# Patient Record
Sex: Male | Born: 1935 | Race: White | Hispanic: No | Marital: Married | Smoking: Former smoker
Health system: Southern US, Community
[De-identification: ages and names within clinical notes are randomized; demographics above are authoritative.]

## PROBLEM LIST (undated history)

## (undated) DIAGNOSIS — F419 Anxiety disorder, unspecified: Secondary | ICD-10-CM

## (undated) DIAGNOSIS — I10 Essential (primary) hypertension: Secondary | ICD-10-CM

## (undated) DIAGNOSIS — I872 Venous insufficiency (chronic) (peripheral): Secondary | ICD-10-CM

## (undated) DIAGNOSIS — F0280 Dementia in other diseases classified elsewhere without behavioral disturbance: Secondary | ICD-10-CM

## (undated) DIAGNOSIS — K219 Gastro-esophageal reflux disease without esophagitis: Secondary | ICD-10-CM

## (undated) DIAGNOSIS — G309 Alzheimer's disease, unspecified: Secondary | ICD-10-CM

## (undated) DIAGNOSIS — M129 Arthropathy, unspecified: Secondary | ICD-10-CM

## (undated) DIAGNOSIS — E538 Deficiency of other specified B group vitamins: Secondary | ICD-10-CM

## (undated) DIAGNOSIS — Z8719 Personal history of other diseases of the digestive system: Secondary | ICD-10-CM

## (undated) DIAGNOSIS — E876 Hypokalemia: Secondary | ICD-10-CM

## (undated) DIAGNOSIS — R55 Syncope and collapse: Secondary | ICD-10-CM

## (undated) HISTORY — DX: Anxiety disorder, unspecified: F41.9

## (undated) HISTORY — DX: Dementia in other diseases classified elsewhere, unspecified severity, without behavioral disturbance, psychotic disturbance, mood disturbance, and anxiety: F02.80

## (undated) HISTORY — DX: Alzheimer's disease, unspecified: G30.9

## (undated) HISTORY — PX: HERNIA REPAIR: SHX51

## (undated) HISTORY — DX: Essential (primary) hypertension: I10

## (undated) HISTORY — DX: Gastro-esophageal reflux disease without esophagitis: K21.9

## (undated) HISTORY — PX: BACK SURGERY: SHX140

---

## 1978-06-12 HISTORY — PX: APPENDECTOMY: SHX54

## 1997-09-14 ENCOUNTER — Ambulatory Visit (HOSPITAL_COMMUNITY): Admission: RE | Admit: 1997-09-14 | Discharge: 1997-09-14 | Payer: Self-pay | Admitting: Hematology & Oncology

## 1997-10-07 ENCOUNTER — Other Ambulatory Visit: Admission: RE | Admit: 1997-10-07 | Discharge: 1997-10-07 | Payer: Self-pay | Admitting: Hematology & Oncology

## 1998-07-22 ENCOUNTER — Ambulatory Visit (HOSPITAL_COMMUNITY): Admission: RE | Admit: 1998-07-22 | Discharge: 1998-07-22 | Payer: Self-pay | Admitting: Hematology & Oncology

## 1999-07-25 ENCOUNTER — Encounter: Admission: RE | Admit: 1999-07-25 | Discharge: 1999-07-25 | Payer: Self-pay | Admitting: Hematology & Oncology

## 1999-07-25 ENCOUNTER — Encounter: Payer: Self-pay | Admitting: Hematology & Oncology

## 1999-07-28 ENCOUNTER — Encounter: Admission: RE | Admit: 1999-07-28 | Discharge: 1999-07-28 | Payer: Self-pay | Admitting: Hematology & Oncology

## 1999-08-02 ENCOUNTER — Encounter: Payer: Self-pay | Admitting: Hematology & Oncology

## 1999-08-02 ENCOUNTER — Encounter: Admission: RE | Admit: 1999-08-02 | Discharge: 1999-08-02 | Payer: Self-pay | Admitting: Hematology & Oncology

## 2000-01-19 ENCOUNTER — Encounter: Payer: Self-pay | Admitting: Neurosurgery

## 2000-01-19 ENCOUNTER — Encounter: Admission: RE | Admit: 2000-01-19 | Discharge: 2000-01-19 | Payer: Self-pay | Admitting: Neurosurgery

## 2001-02-16 ENCOUNTER — Emergency Department (HOSPITAL_COMMUNITY): Admission: EM | Admit: 2001-02-16 | Discharge: 2001-02-16 | Payer: Self-pay | Admitting: Emergency Medicine

## 2001-02-16 ENCOUNTER — Encounter: Payer: Self-pay | Admitting: Emergency Medicine

## 2001-02-20 ENCOUNTER — Encounter: Payer: Self-pay | Admitting: Urology

## 2001-02-20 ENCOUNTER — Encounter: Admission: RE | Admit: 2001-02-20 | Discharge: 2001-02-20 | Payer: Self-pay | Admitting: Urology

## 2001-02-22 ENCOUNTER — Encounter: Payer: Self-pay | Admitting: Urology

## 2001-02-25 ENCOUNTER — Encounter: Payer: Self-pay | Admitting: Urology

## 2001-02-25 ENCOUNTER — Ambulatory Visit (HOSPITAL_COMMUNITY): Admission: RE | Admit: 2001-02-25 | Discharge: 2001-02-25 | Payer: Self-pay | Admitting: Urology

## 2001-03-06 ENCOUNTER — Ambulatory Visit (HOSPITAL_COMMUNITY): Admission: RE | Admit: 2001-03-06 | Discharge: 2001-03-06 | Payer: Self-pay | Admitting: Urology

## 2001-12-22 ENCOUNTER — Emergency Department (HOSPITAL_COMMUNITY): Admission: EM | Admit: 2001-12-22 | Discharge: 2001-12-22 | Payer: Self-pay | Admitting: Emergency Medicine

## 2002-06-12 HISTORY — PX: CATARACT EXTRACTION, BILATERAL: SHX1313

## 2002-06-12 HISTORY — PX: SHOULDER SURGERY: SHX246

## 2003-06-13 HISTORY — PX: EYE SURGERY: SHX253

## 2003-07-14 ENCOUNTER — Other Ambulatory Visit: Payer: Self-pay

## 2005-03-01 ENCOUNTER — Ambulatory Visit: Payer: Self-pay | Admitting: Internal Medicine

## 2005-03-01 DIAGNOSIS — K219 Gastro-esophageal reflux disease without esophagitis: Secondary | ICD-10-CM | POA: Insufficient documentation

## 2005-03-07 ENCOUNTER — Ambulatory Visit: Payer: Self-pay | Admitting: Internal Medicine

## 2005-04-13 ENCOUNTER — Ambulatory Visit: Payer: Self-pay | Admitting: Internal Medicine

## 2005-05-02 ENCOUNTER — Ambulatory Visit: Payer: Self-pay | Admitting: Internal Medicine

## 2005-10-11 ENCOUNTER — Ambulatory Visit: Payer: Self-pay | Admitting: Internal Medicine

## 2005-10-27 ENCOUNTER — Emergency Department: Payer: Self-pay | Admitting: Emergency Medicine

## 2006-03-21 ENCOUNTER — Ambulatory Visit: Payer: Self-pay | Admitting: Internal Medicine

## 2006-04-13 ENCOUNTER — Ambulatory Visit: Payer: Self-pay | Admitting: Internal Medicine

## 2006-09-07 ENCOUNTER — Emergency Department (HOSPITAL_COMMUNITY): Admission: EM | Admit: 2006-09-07 | Discharge: 2006-09-08 | Payer: Self-pay | Admitting: Emergency Medicine

## 2006-09-10 ENCOUNTER — Ambulatory Visit: Payer: Self-pay | Admitting: Internal Medicine

## 2006-09-11 ENCOUNTER — Ambulatory Visit: Payer: Self-pay | Admitting: Gastroenterology

## 2006-09-19 ENCOUNTER — Encounter: Payer: Self-pay | Admitting: Internal Medicine

## 2006-09-19 DIAGNOSIS — Z8719 Personal history of other diseases of the digestive system: Secondary | ICD-10-CM

## 2006-09-19 DIAGNOSIS — M159 Polyosteoarthritis, unspecified: Secondary | ICD-10-CM

## 2006-09-19 DIAGNOSIS — M129 Arthropathy, unspecified: Secondary | ICD-10-CM

## 2006-09-19 DIAGNOSIS — I872 Venous insufficiency (chronic) (peripheral): Secondary | ICD-10-CM

## 2006-09-19 DIAGNOSIS — I10 Essential (primary) hypertension: Secondary | ICD-10-CM

## 2006-09-19 DIAGNOSIS — K5900 Constipation, unspecified: Secondary | ICD-10-CM | POA: Insufficient documentation

## 2006-09-19 HISTORY — DX: Essential (primary) hypertension: I10

## 2006-09-19 HISTORY — DX: Personal history of other diseases of the digestive system: Z87.19

## 2006-09-19 HISTORY — DX: Arthropathy, unspecified: M12.9

## 2006-09-19 HISTORY — DX: Venous insufficiency (chronic) (peripheral): I87.2

## 2006-09-26 ENCOUNTER — Ambulatory Visit: Payer: Self-pay | Admitting: Gastroenterology

## 2006-10-12 ENCOUNTER — Ambulatory Visit: Payer: Self-pay | Admitting: Internal Medicine

## 2006-10-16 ENCOUNTER — Encounter: Payer: Self-pay | Admitting: Internal Medicine

## 2006-12-24 ENCOUNTER — Ambulatory Visit: Payer: Self-pay | Admitting: Internal Medicine

## 2007-03-27 ENCOUNTER — Telehealth (INDEPENDENT_AMBULATORY_CARE_PROVIDER_SITE_OTHER): Payer: Self-pay | Admitting: *Deleted

## 2007-03-29 ENCOUNTER — Ambulatory Visit: Payer: Self-pay | Admitting: Internal Medicine

## 2007-04-16 ENCOUNTER — Ambulatory Visit: Payer: Self-pay | Admitting: Internal Medicine

## 2007-04-17 LAB — CONVERTED CEMR LAB
CO2: 30 meq/L (ref 19–32)
Chloride: 106 meq/L (ref 96–112)
GFR calc Af Amer: 105 mL/min
Glucose, Bld: 94 mg/dL (ref 70–99)
Potassium: 4 meq/L (ref 3.5–5.1)
Sodium: 143 meq/L (ref 135–145)

## 2007-05-13 ENCOUNTER — Telehealth: Payer: Self-pay | Admitting: Internal Medicine

## 2007-06-26 ENCOUNTER — Telehealth (INDEPENDENT_AMBULATORY_CARE_PROVIDER_SITE_OTHER): Payer: Self-pay | Admitting: *Deleted

## 2007-07-15 ENCOUNTER — Ambulatory Visit: Payer: Self-pay | Admitting: Internal Medicine

## 2007-07-22 ENCOUNTER — Ambulatory Visit: Payer: Self-pay | Admitting: Gastroenterology

## 2007-07-26 ENCOUNTER — Encounter: Payer: Self-pay | Admitting: Internal Medicine

## 2007-07-31 ENCOUNTER — Telehealth (INDEPENDENT_AMBULATORY_CARE_PROVIDER_SITE_OTHER): Payer: Self-pay | Admitting: *Deleted

## 2007-08-27 ENCOUNTER — Ambulatory Visit (HOSPITAL_COMMUNITY): Admission: RE | Admit: 2007-08-27 | Discharge: 2007-08-27 | Payer: Self-pay | Admitting: Gastroenterology

## 2007-09-13 ENCOUNTER — Telehealth (INDEPENDENT_AMBULATORY_CARE_PROVIDER_SITE_OTHER): Payer: Self-pay | Admitting: *Deleted

## 2007-10-04 ENCOUNTER — Ambulatory Visit: Payer: Self-pay | Admitting: Internal Medicine

## 2007-10-07 LAB — CONVERTED CEMR LAB
ALT: 13 units/L (ref 0–53)
AST: 18 units/L (ref 0–37)
Basophils Absolute: 0 10*3/uL (ref 0.0–0.1)
Basophils Relative: 1 % (ref 0–1)
CO2: 22 meq/L (ref 19–32)
Chloride: 108 meq/L (ref 96–112)
Creatinine, Ser: 0.98 mg/dL (ref 0.40–1.50)
Hemoglobin: 15 g/dL (ref 13.0–17.0)
Indirect Bilirubin: 0.5 mg/dL (ref 0.0–0.9)
Lymphocytes Relative: 21 % (ref 12–46)
Monocytes Absolute: 0.5 10*3/uL (ref 0.1–1.0)
Neutro Abs: 4.3 10*3/uL (ref 1.7–7.7)
Neutrophils Relative %: 69 % (ref 43–77)
Phosphorus: 2.4 mg/dL (ref 2.3–4.6)
Potassium: 4.1 meq/L (ref 3.5–5.3)
RDW: 13.8 % (ref 11.5–15.5)
Sodium: 143 meq/L (ref 135–145)
TSH: 0.858 microintl units/mL (ref 0.350–5.50)
Total Protein: 7.2 g/dL (ref 6.0–8.3)
Vitamin B-12: 374 pg/mL (ref 211–911)

## 2007-10-08 ENCOUNTER — Ambulatory Visit: Payer: Self-pay | Admitting: Cardiology

## 2007-10-08 ENCOUNTER — Telehealth: Payer: Self-pay | Admitting: Internal Medicine

## 2007-10-21 ENCOUNTER — Telehealth (INDEPENDENT_AMBULATORY_CARE_PROVIDER_SITE_OTHER): Payer: Self-pay | Admitting: *Deleted

## 2007-11-12 ENCOUNTER — Telehealth: Payer: Self-pay | Admitting: Internal Medicine

## 2007-11-14 ENCOUNTER — Ambulatory Visit: Payer: Self-pay | Admitting: Internal Medicine

## 2007-11-18 ENCOUNTER — Telehealth (INDEPENDENT_AMBULATORY_CARE_PROVIDER_SITE_OTHER): Payer: Self-pay | Admitting: *Deleted

## 2007-11-21 ENCOUNTER — Encounter: Payer: Self-pay | Admitting: Internal Medicine

## 2007-11-22 ENCOUNTER — Inpatient Hospital Stay (HOSPITAL_COMMUNITY): Admission: RE | Admit: 2007-11-22 | Discharge: 2007-11-24 | Payer: Self-pay | Admitting: Surgery

## 2007-11-24 ENCOUNTER — Encounter: Payer: Self-pay | Admitting: Internal Medicine

## 2008-01-27 ENCOUNTER — Telehealth: Payer: Self-pay | Admitting: Family Medicine

## 2008-03-09 ENCOUNTER — Telehealth: Payer: Self-pay | Admitting: Internal Medicine

## 2008-03-11 ENCOUNTER — Ambulatory Visit: Payer: Self-pay | Admitting: Internal Medicine

## 2008-05-06 ENCOUNTER — Telehealth: Payer: Self-pay | Admitting: Family Medicine

## 2008-06-09 ENCOUNTER — Telehealth: Payer: Self-pay | Admitting: Internal Medicine

## 2008-09-25 ENCOUNTER — Telehealth: Payer: Self-pay | Admitting: Internal Medicine

## 2008-09-25 ENCOUNTER — Ambulatory Visit: Payer: Self-pay | Admitting: Internal Medicine

## 2008-09-25 DIAGNOSIS — F0280 Dementia in other diseases classified elsewhere without behavioral disturbance: Secondary | ICD-10-CM | POA: Insufficient documentation

## 2008-09-25 DIAGNOSIS — G309 Alzheimer's disease, unspecified: Secondary | ICD-10-CM

## 2008-09-25 HISTORY — DX: Dementia in other diseases classified elsewhere, unspecified severity, without behavioral disturbance, psychotic disturbance, mood disturbance, and anxiety: F02.80

## 2008-11-04 ENCOUNTER — Ambulatory Visit: Payer: Self-pay | Admitting: Internal Medicine

## 2008-11-06 LAB — CONVERTED CEMR LAB
ALT: 17 units/L (ref 0–53)
AST: 26 units/L (ref 0–37)
Albumin: 4.7 g/dL (ref 3.5–5.2)
BUN: 10 mg/dL (ref 6–23)
Basophils Absolute: 0 10*3/uL (ref 0.0–0.1)
Bilirubin, Direct: 0.1 mg/dL (ref 0.0–0.3)
Creatinine, Ser: 0.8 mg/dL (ref 0.4–1.5)
Eosinophils Relative: 2.5 % (ref 0.0–5.0)
HCT: 43.8 % (ref 39.0–52.0)
Lymphs Abs: 1.1 10*3/uL (ref 0.7–4.0)
Monocytes Absolute: 0.3 10*3/uL (ref 0.1–1.0)
Monocytes Relative: 7.9 % (ref 3.0–12.0)
Neutrophils Relative %: 63.7 % (ref 43.0–77.0)
Phosphorus: 3.2 mg/dL (ref 2.3–4.6)
Platelets: 203 10*3/uL (ref 150.0–400.0)
RDW: 11.8 % (ref 11.5–14.6)
Sed Rate: 5 mm/hr (ref 0–22)
Total Bilirubin: 1 mg/dL (ref 0.3–1.2)
Vitamin B-12: 193 pg/mL — ABNORMAL LOW (ref 211–911)
WBC: 4.4 10*3/uL — ABNORMAL LOW (ref 4.5–10.5)

## 2008-11-23 ENCOUNTER — Ambulatory Visit: Payer: Self-pay | Admitting: Internal Medicine

## 2008-11-23 DIAGNOSIS — E538 Deficiency of other specified B group vitamins: Secondary | ICD-10-CM

## 2008-11-23 HISTORY — DX: Deficiency of other specified B group vitamins: E53.8

## 2008-11-23 LAB — CONVERTED CEMR LAB: Vitamin B-12: 407 pg/mL (ref 211–911)

## 2009-03-16 ENCOUNTER — Ambulatory Visit: Payer: Self-pay | Admitting: Internal Medicine

## 2009-11-22 ENCOUNTER — Telehealth: Payer: Self-pay | Admitting: Internal Medicine

## 2009-12-09 ENCOUNTER — Ambulatory Visit: Payer: Self-pay | Admitting: Internal Medicine

## 2009-12-09 DIAGNOSIS — F39 Unspecified mood [affective] disorder: Secondary | ICD-10-CM | POA: Insufficient documentation

## 2010-01-28 ENCOUNTER — Ambulatory Visit: Payer: Self-pay | Admitting: Internal Medicine

## 2010-03-09 ENCOUNTER — Ambulatory Visit: Payer: Self-pay | Admitting: Internal Medicine

## 2010-04-10 IMAGING — CT CT ABDOMEN WO/W CM
3 of 6 series · 12 of 46 positions shown, 17 images · IV contrast (agent unspecified)
Comparison: No comparison images.  Reviewed a report from
02/16/2001.

Addendum Begins

Findings of this report were discussed with Dr. Sherling Anneth.
Addendum Ends
CLINICAL DATA: Left flank pain
CT ABDOMEN WITHOUT AND WITH CONTRAST
TECHNIQUE: Multidetector CT imaging of the abdomen was performed
following the standard protocol before and during bolus
administration of intravenous contrast.
Contrast: 125 ml.

[Series 3: abd xxl 5.0 b10f st · axial · 0.91mm/px · z∈[-234,+22]mm · 8 of 67 slices shown, 13 images]
[im 8/67  soft-tissue]
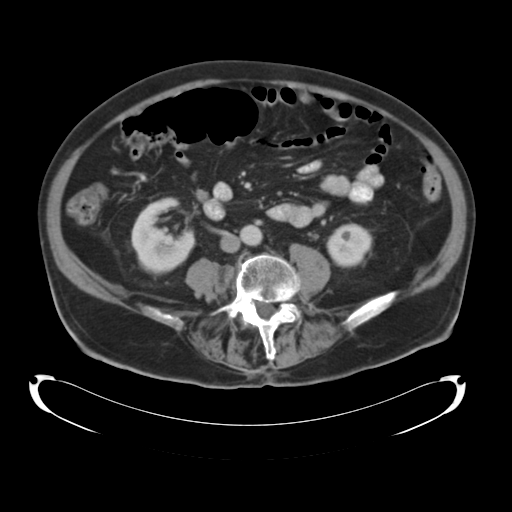
[im 8/67  bone]
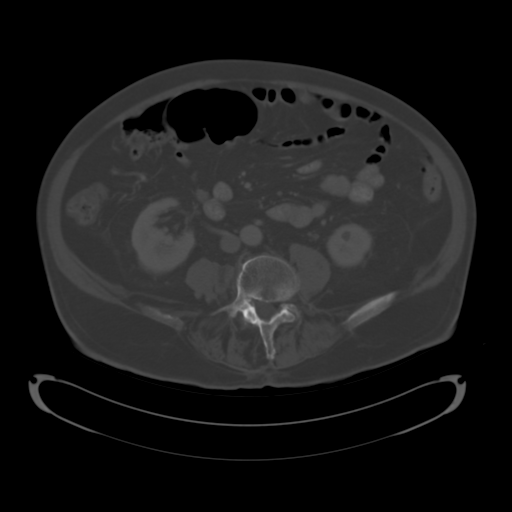
[im 15/67  soft-tissue]
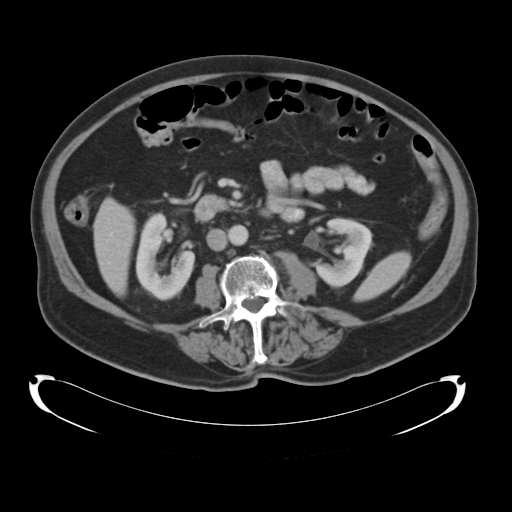
[im 23/67  soft-tissue]
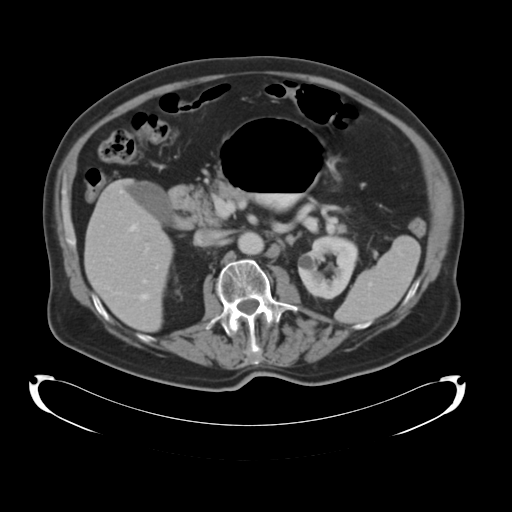
[im 30/67  soft-tissue]
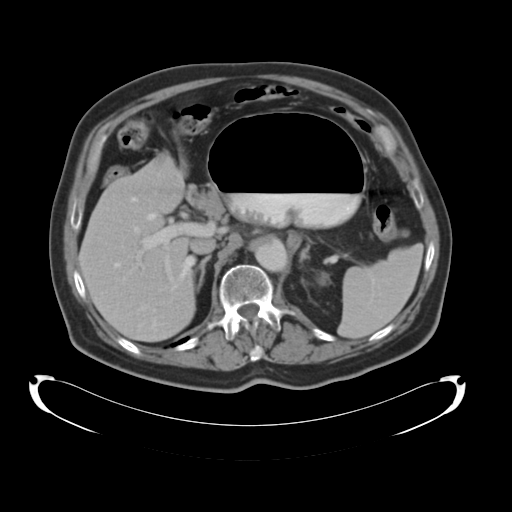
[im 37/67  soft-tissue]
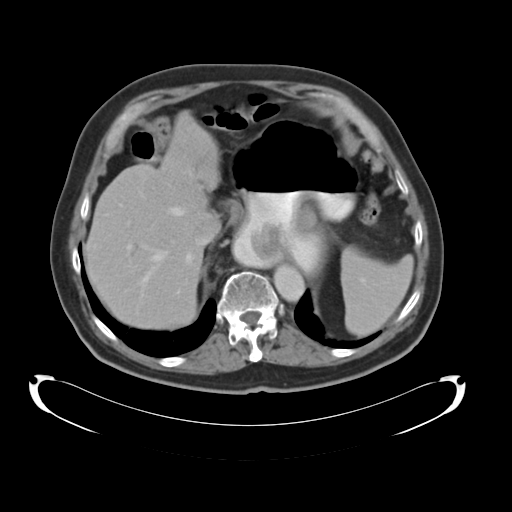
[im 37/67  lung]
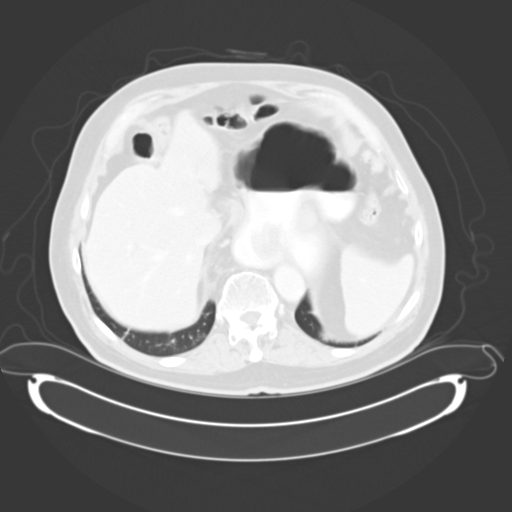
[im 45/67  soft-tissue]
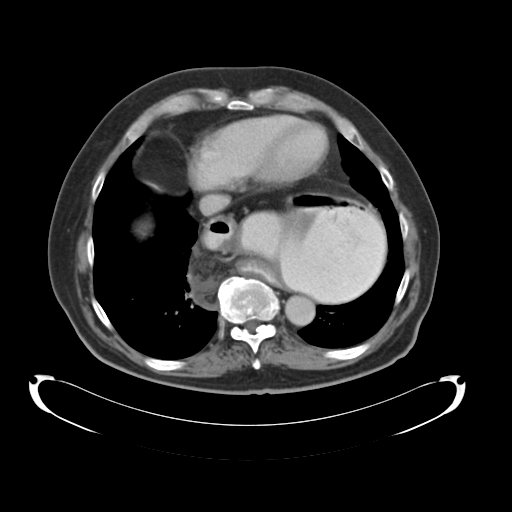
[im 45/67  lung]
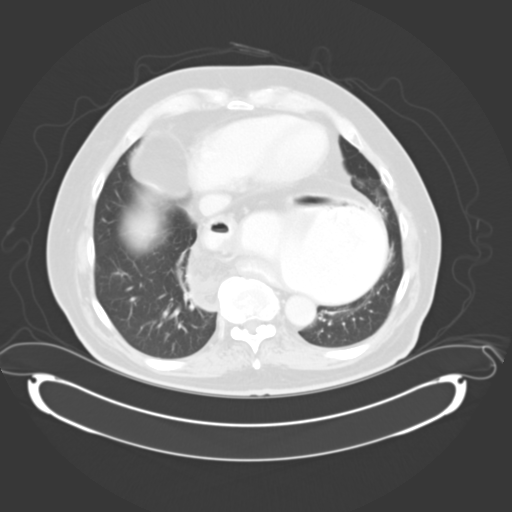
[im 52/67  soft-tissue]
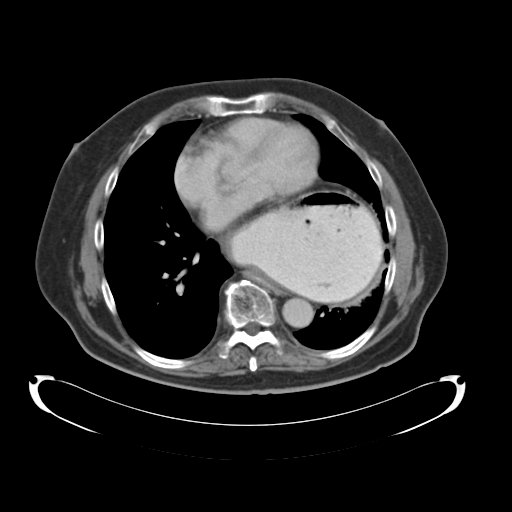
[im 52/67  lung]
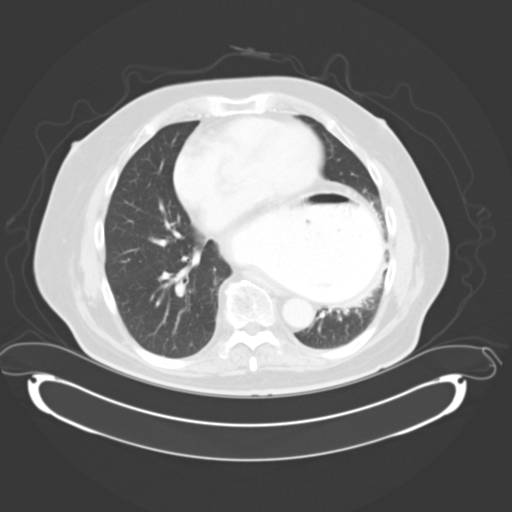
[im 59/67  soft-tissue]
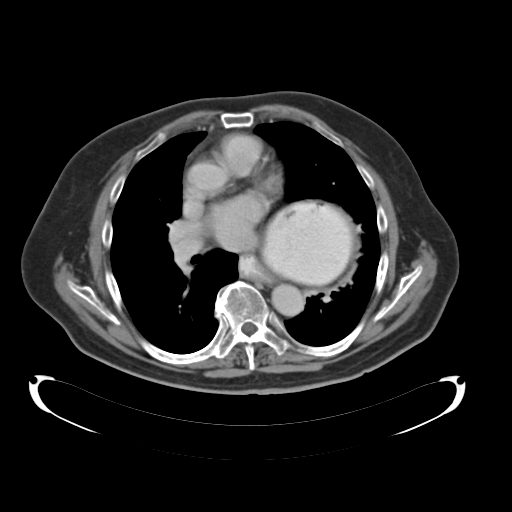
[im 59/67  lung]
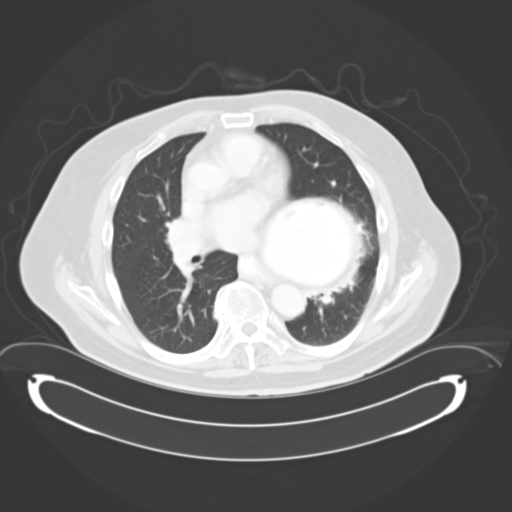

[Series 602: <mpr thick range> · coronal · 0.91mm/px · 3 of 98 slices shown]
[im 33/98  soft-tissue]
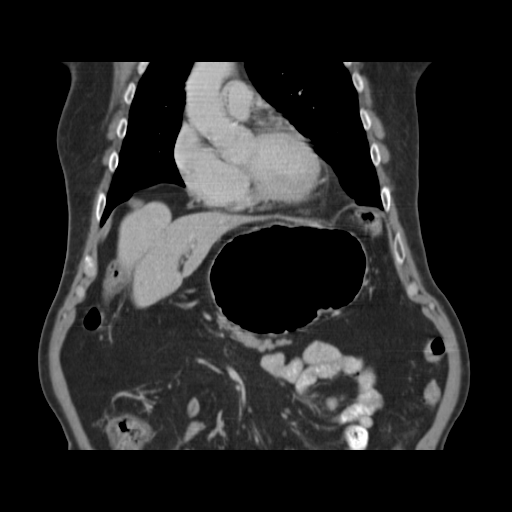
[im 44/98  soft-tissue]
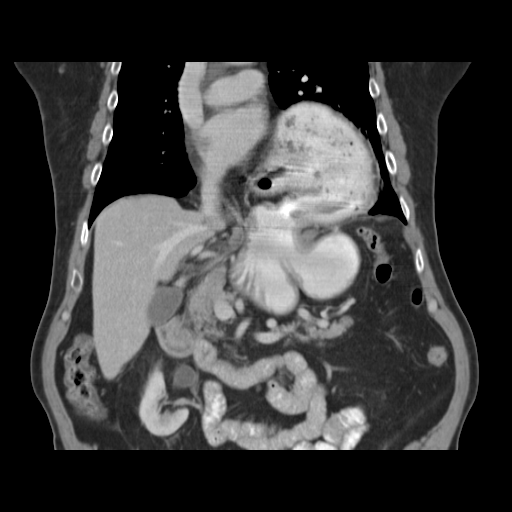
[im 54/98  soft-tissue]
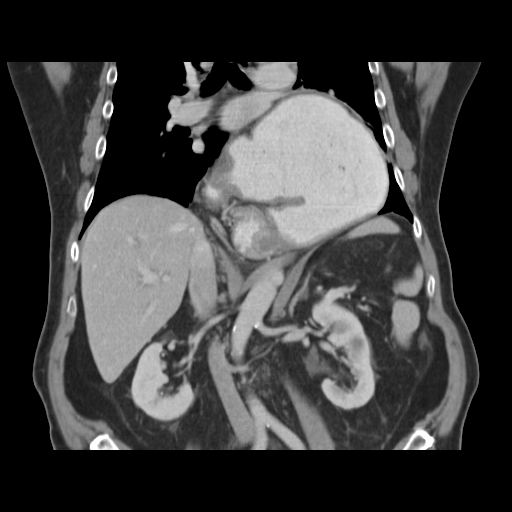

[Series 603: <mpr thick range(1)> · sagittal · 0.91mm/px · 1 of 133 slices shown]
[im 45/133  soft-tissue]
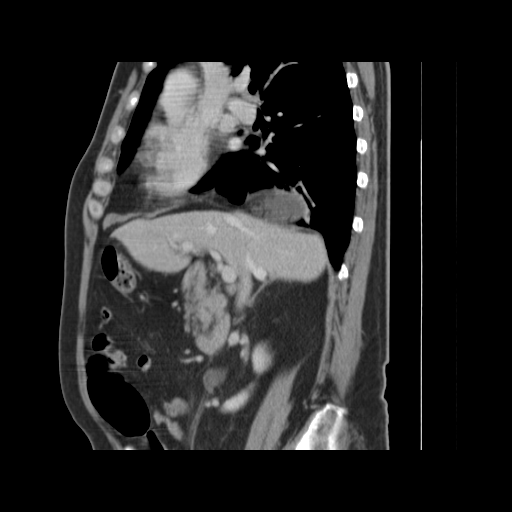

[12 of 46 positions shown; findings below may reference images not displayed]

FINDINGS: The lung bases are clear and there is no evidence for
free air.  The patient has a very large para-esophageal hiatal
hernia.  There is a large amount of oral contrast within the
stomach and small amount of contrast in the small bowel.  Dependent
fluid or edema in the herniating tissue in the right hemithorax.
The greater curvature of the stomach has a cephalad position and
findings are suggestive for an organoaxial gastric volvulus.  There
is a large air-fluid level in the intra-abdominal portion of the
stomach and the gastric antrum and pylorus appear to be near the
esophageal hiatus.  Suspect that there is a component of partial
gastric outlet obstruction.  3 mm stone in the mid pole of the
right kidney.  There are no renal calculi identified in the left
kidney.  Small cortical cyst along the lower pole of the right
kidney and evidence for three cortical cysts in the mid pole of the
left kidney and a smaller cyst in the lower pole of the left
kidney.  Evidence for extrarenal pelvis bilaterally without
hydronephrosis.  Normal appearance of the adrenal tissue, spleen
and pancreas and liver.  The portal venous system is patent.  Few
scattered punctate low density areas throughout the liver probably
represent small cysts.  The gallbladder has a normal appearance.
There are diverticula associated the left colon without acute
inflammatory changes.  Mild perinephric stranding bilaterally and
suspect this is chronic. Multilevel degenerative changes of the
lumbar spine.
IMPRESSION: Large para-esophageal hiatal hernia with distension of the stomach.
The orientation of the stomach is suggestive for an organoaxial
gastric volvulus.  Suspect there is a partial gastric outlet
obstruction.  The presence of fluid and edema in the herniating fat
is concerning for a component of vascular compromise and
strangulation.  There is no significant gastric wall thickening at
this time.

Nonobstructive right renal calculus.

Bilateral renal cysts.

Critical test results telephoned to Dr. Bon Ryan at the time of
interpretation on date 10/08/2007 at time [DATE]. These results
were repeated back to the interpreting radiologist for
verification. Dr.Bon Ryan asked us to inform Dr. Thanh Tam Kientz of
Surgery of this report.  Dr. Jose J was unavailable and we will make
an attempt to contact the covering physician..

## 2010-04-25 ENCOUNTER — Ambulatory Visit: Payer: Self-pay | Admitting: Internal Medicine

## 2010-04-28 LAB — CONVERTED CEMR LAB
ALT: 19 U/L
AST: 26 U/L
Albumin: 4.7 g/dL
Alkaline Phosphatase: 48 U/L
BUN: 14 mg/dL
Basophils Absolute: 0 K/uL
Basophils Relative: 0.4 %
Bilirubin, Direct: 0.2 mg/dL
CO2: 28 meq/L
Calcium: 9.8 mg/dL
Chloride: 103 meq/L
Creatinine, Ser: 0.8 mg/dL
Eosinophils Absolute: 0.1 K/uL
Eosinophils Relative: 2.9 %
GFR calc non Af Amer: 101.4 mL/min
Glucose, Bld: 94 mg/dL
HCT: 42 %
Hemoglobin: 14.7 g/dL
Lymphocytes Relative: 22 %
Lymphs Abs: 1.1 K/uL
MCHC: 35 g/dL
MCV: 104.2 fL — ABNORMAL HIGH
Monocytes Absolute: 0.5 K/uL
Monocytes Relative: 9.1 %
Neutro Abs: 3.3 K/uL
Neutrophils Relative %: 65.6 %
Phosphorus: 3.3 mg/dL
Platelets: 226 K/uL
Potassium: 4.6 meq/L
RBC: 4.03 M/uL — ABNORMAL LOW
RDW: 13.5 %
Sodium: 138 meq/L
TSH: 1.59 u[IU]/mL
Total Bilirubin: 0.8 mg/dL
Total Protein: 6.9 g/dL
WBC: 5 10*3/microliter

## 2010-05-31 ENCOUNTER — Telehealth: Payer: Self-pay | Admitting: Internal Medicine

## 2010-07-03 ENCOUNTER — Encounter: Payer: Self-pay | Admitting: Surgery

## 2010-07-12 NOTE — Assessment & Plan Note (Signed)
Summary: follow-up/ds   Vital Signs:  Patient profile:   77 year old male Height:      68 inches Weight:      198.25 pounds BMI:     30.25 Temp:     98.1 degrees F oral Pulse rate:   68 / minute Pulse rhythm:   regular BP sitting:   128 / 76  (left arm) Cuff size:   regular  Vitals Entered By: Lewanda Rife LPN (December 09, 2009 2:38 PM) CC: follow-up visit   History of Present Illness: He feels he is doing well He states "I work every day"--wife states he doesn't work Mostly in chair in house He feels he is satisfied  She notes slow decline in memory Still independent in all personal care Never did indoor work wife handles all the finances  Gets upset per wife----son still running business and he wants to go with him No  insight wife feels he has gotten worse---upset, anxious  Wife still able to leave him to do errands, etc  Allergies (verified): No Known Drug Allergies  Past History:  Past medical, surgical, family and social histories (including risk factors) reviewed for relevance to current acute and chronic problems.  Past Medical History: GERD  Hypertension Altzheimers Anxiety  Past Surgical History: Reviewed history from 09/19/2006 and no changes required. Appendectomy 1980 Back surgery 1982 Back surgery 1992 Bilateral cataract removal 2003 Right shoulder surgery 2004 Laser surgery eyes 2005  Family History: Reviewed history from 11/14/2007 and no changes required. Dad died @62  of kidney disease Mom died @92  asthma & old age.Had Altzheimers 2 brothers---1 died @68  of COPD. Othe with Altzheimers 2 sisters No CAD, DM Arthritis in older brother No cancer  Social History: Reviewed history from 07/15/2007 and no changes required. Occupation: does grading work--getting ready to slow down Married 2 children Never Smoked Alcohol use-no "Workaholic"  No living will--would accept resuscitation (discussion 11/06)  Review of Systems       sleeps  fine appetite is fine weight is fairly stable  Physical Exam  General:  alert and normal appearance.   Neurologic:  repeating himself no insight clearly has impaired judgement Psych:  normally interactive, good eye contact, and slightly anxious.     Impression & Recommendations:  Problem # 1:  ALZHEIMERS DISEASE (ICD-331.0) Assessment Deteriorated has had continued decline cognitively aricept did no good  Problem # 2:  HYPERTENSION (ICD-401.9) Assessment: Unchanged BP is fine His updated medication list for this problem includes:    Plendil 5 Mg Tb24 (Felodipine) .Marland Kitchen... 1 daily  BP today: 128/76 Prior BP: 138/62 (11/04/2008)  Prior 10 Yr Risk Heart Disease: Not enough information (10/12/2006)  Labs Reviewed: K+: 4.6 (11/04/2008) Creat: : 0.8 (11/04/2008)     Problem # 3:  ANXIETY (ICD-300.00) Assessment: Deteriorated  has had more affective problems mostly anxiety related to not working, etc----not clearly depressed  His updated medication list for this problem includes:    Citalopram Hydrobromide 10 Mg Tabs (Citalopram hydrobromide) .Marland Kitchen... 1 tab daily for anxiety  Complete Medication List: 1)  Plendil 5 Mg Tb24 (Felodipine) .Marland Kitchen.. 1 daily 2)  Centrum Tabs (Multiple vitamins-minerals) .Marland Kitchen.. 1 once daily 3)  Citalopram Hydrobromide 10 Mg Tabs (Citalopram hydrobromide) .Marland Kitchen.. 1 tab daily for anxiety  Patient Instructions: 1)  Please schedule a follow-up appointment in 6 weeks.  2)  Please start the citalopram once daily. Call if any apparent side effects like sedation or increased agitation Prescriptions: CITALOPRAM HYDROBROMIDE 10 MG TABS (CITALOPRAM HYDROBROMIDE) 1  tab daily for anxiety  #30 x 11   Entered and Authorized by:   Cindee Salt MD   Signed by:   Cindee Salt MD on 12/09/2009   Method used:   Electronically to        [facility]. [address] [phone]* (retail)       [address] [place], [place]  [address]       Ph: [phone]        Fax: [phone]   RxID:   [id number]   Current Allergies (reviewed today): No known allergies

## 2010-07-12 NOTE — Assessment & Plan Note (Signed)
Summary: 6 WK F/U/CLE   Vital Signs:  Patient profile:   77 year old male Weight:      197 pounds Temp:     98.1 degrees F oral Pulse rate:   60 / minute Pulse rhythm:   regular BP sitting:   126 / 70  (left arm) Cuff size:   large  Vitals Entered By: Mervin Hack CMA Duncan Dull) (January 28, 2010 3:05 PM) CC: 6 week follow-up   History of Present Illness: He feels fine No complaints some leg pain--late afternoon (though he has legs up in recliner most of the day) still speaks about working on tractor, etc (which he doesn't do anymore)  Wife notes that his mood doesn't seem better on the citalopram No side effects not depressed  Allergies: No Known Drug Allergies  Past History:  Past medical, surgical, family and social histories (including risk factors) reviewed for relevance to current acute and chronic problems.  Past Medical History: Reviewed history from 12/09/2009 and no changes required. GERD  Hypertension Altzheimers Anxiety  Past Surgical History: Reviewed history from 09/19/2006 and no changes required. Appendectomy 1980 Back surgery 1982 Back surgery 1992 Bilateral cataract removal 2003 Right shoulder surgery 2004 Laser surgery eyes 2005  Family History: Reviewed history from 11/14/2007 and no changes required. Dad died @62  of kidney disease Mom died @92  asthma & old age.Had Altzheimers 2 brothers---1 died @68  of COPD. Othe with Altzheimers 2 sisters No CAD, DM Arthritis in older brother No cancer  Social History: Reviewed history from 07/15/2007 and no changes required. 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   Psych:  normally interactive, good eye contact, not  anxious appearing, and not depressed appearing.     Impression & Recommendations:  Problem # 1:  ANXIETY (ICD-300.00) Assessment Unchanged will increase the dose to 20mg   consider increase to 40mg  if he has a partial respose without side effects  The following medications were removed from the medication list:    Citalopram Hydrobromide 10 Mg Tabs (Citalopram hydrobromide) .Marland Kitchen... 1 tab daily for anxiety His updated medication list for this problem includes:    Citalopram Hydrobromide 20 Mg Tabs (Citalopram hydrobromide) .Marland Kitchen... 1 tab by mouth daily for anxiety  Complete Medication List: 1)  Plendil 5 Mg Tb24 (Felodipine) .Marland Kitchen.. 1 daily 2)  Centrum Tabs (Multiple vitamins-minerals) .Marland Kitchen.. 1 once daily 3)  Citalopram Hydrobromide 20 Mg Tabs (Citalopram hydrobromide) .Marland Kitchen.. 1 tab by mouth daily for anxiety  Patient Instructions: 1)  Please schedule a follow-up appointment in 3-4 months .  2)  Please increase the citalopram to 20mg  daily 3)  Please call if the anxiety is not better on the new dose within about 1 month. Prescriptions: CITALOPRAM HYDROBROMIDE 20 MG TABS (CITALOPRAM HYDROBROMIDE) 1 tab by mouth daily for anxiety  #30 x 5   Entered and Authorized by:   Cindee Salt MD   Signed by:   Cindee Salt MD on 01/28/2010   Method used:   Electronically to        [facility]. [address] [phone]* (retail)       [address] [place], [place]  [address]       Ph: [phone]  Fax: [phone]   RxID:   [id number]   Current Allergies (reviewed today): No known allergies

## 2010-07-12 NOTE — Progress Notes (Signed)
Summary: PLENDIL  Phone Note Refill Request Message from:  [id number] on November 22, 2009 11:02 AM  Refills Requested: Medication #1:  PLENDIL 5 MG TB24 1 daily   Last Refilled: 11/11/2009 E-Scribe Request, patient hasn't been send since 11/04/2008, pt sch appt for 12/09/2009 @ 2:45, ok to fill?   Method Requested: Electronic Initial call taken by: Mervin Hack CMA Duncan Dull),  November 22, 2009 11:07 AM  Follow-up for Phone Call        okay to fill #30 x 1 Follow-up by: Cindee Salt MD,  November 22, 2009 2:07 PM  Additional Follow-up for Phone Call Additional follow up Details #1::        Rx faxed to pharmacy Additional Follow-up by: DeShannon Katrinka Blazing CMA Duncan Dull),  November 22, 2009 2:39 PM    Prescriptions: PLENDIL 5 MG TB24 (FELODIPINE) 1 daily  #30 Tablet x 1   Entered by:   Mervin Hack CMA (AAMA)   Authorized by:   Cindee Salt MD   Signed by:   Mervin Hack CMA (AAMA) on 11/22/2009   Method used:   Electronically to        [facility]. [address] [phone]* (retail)       [address] [place], [place]  [id number]       Ph: [phone]       Fax: [phone]   RxID:   [id number]

## 2010-07-12 NOTE — Assessment & Plan Note (Signed)
Summary: ROA FOR 3-4 MONTH FOLLOW-UP/JRR   Vital Signs:  Patient profile:   77 year old male Weight:      196 pounds Temp:     98.6 degrees F oral Pulse rate:   68 / minute Pulse rhythm:   regular BP sitting:   140 / 60  (left arm) Cuff size:   large  Vitals Entered By: Mervin Hack CMA Duncan Dull) (April 25, 2010 3:30 PM) CC: follow-up   History of Present Illness: DOIng okay wife is here he doesn't notice any changes  Wife does feel his nerves are better on the increased citalopram No depression  Memory issues continue wife notes some progression Wife has always done the bills has stopped all work but he still talks about doing this Still handles all personal care though he is not as good at shaving  No chest pain  No SOB  No edema but he does get pain in calves and feet takes tylenol--that helps   Allergies: No Known Drug Allergies  Past History:  Past medical, surgical, family and social histories (including risk factors) reviewed for relevance to current acute and chronic problems.  Past Medical History: Reviewed history from 12/09/2009 and no changes required. GERD  Hypertension Altzheimers Anxiety  Past Surgical History: Reviewed history from 09/19/2006 and no changes required. Appendectomy 1980 Back surgery 1982 Back surgery 1992 Bilateral cataract removal 2003 Right shoulder surgery 2004 Laser surgery eyes 2005  Family History: Reviewed history from 11/14/2007 and no changes required. Dad died @62  of kidney disease Mom died @92  asthma & old age.Had Altzheimers 2 brothers---1 died @68  of COPD. Othe with Altzheimers 2 sisters No CAD, DM Arthritis in older brother No cancer  Social History: Reviewed history from 07/15/2007 and no changes required. 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   Neck:  supple, no masses, no thyromegaly, no carotid bruits, and no cervical lymphadenopathy.   Lungs:  normal respiratory effort, no intercostal retractions, no accessory muscle use, and normal breath sounds.   Heart:  normal rate, regular rhythm, no murmur, and no gallop.   Pulses:  1+ in feet Extremities:  no sig edema or calf tenderness Neurologic:  confused but pleasant still talks about his daily grading work when he hasn't done this in a long time Psych:  normally interactive, good eye contact, not anxious appearing, and not depressed appearing.     Impression & Recommendations:  Problem # 1:  ANXIETY (ICD-300.00) Assessment Improved doing better on this dose no changes  His updated medication list for this problem includes:    Citalopram Hydrobromide 20 Mg Tabs (Citalopram hydrobromide) .Marland Kitchen... 1 tab by mouth daily for anxiety  Problem # 2:  ALZHEIMERS DISEASE (ICD-331.0) Assessment: Deteriorated mild with slow progression early discussion with wife about finances and needing help eventually  Problem # 3:  HYPERTENSION (ICD-401.9) Assessment: Unchanged  good control No changes needed due for labs  His updated medication list for this problem includes:    Plendil 5 Mg Tb24 (Felodipine) .Marland Kitchen... 1 daily  BP today: 140/60 Prior BP: 126/70 (01/28/2010)  Prior 10 Yr Risk Heart Disease: Not enough information (10/12/2006)  Labs Reviewed: K+: 4.6 (11/04/2008) Creat: : 0.8 (11/04/2008)  Orders: Venipuncture (59563) TLB-Renal Function Panel (80069-RENAL) TLB-CBC Platelet - w/Differential (85025-CBCD) TLB-Hepatic/Liver Function Pnl (80076-HEPATIC) TLB-TSH (Thyroid Stimulating Hormone) (84443-TSH)  Complete Medication List: 1)  Plendil 5 Mg Tb24 (Felodipine) .Marland Kitchen.. 1 daily 2)  Citalopram Hydrobromide 20 Mg Tabs (Citalopram  hydrobromide) .Marland Kitchen.. 1 tab by mouth daily for anxiety 3)  Centrum Tabs (Multiple vitamins-minerals) .Marland Kitchen.. 1 once daily  Patient Instructions: 1)  Please schedule a follow-up appointment in 4 months .    Orders Added: 1)  Est. Patient Level IV [87564] 2)  Venipuncture [36415] 3)  TLB-Renal Function Panel [80069-RENAL] 4)  TLB-CBC Platelet - w/Differential [85025-CBCD] 5)  TLB-Hepatic/Liver Function Pnl [80076-HEPATIC] 6)  TLB-TSH (Thyroid Stimulating Hormone) [33295-JOA]    Current Allergies (reviewed today): No known allergies

## 2010-07-12 NOTE — Assessment & Plan Note (Signed)
Summary: letvak flu shot/rbh   Nurse Visit   Allergies: No Known Drug Allergies  Orders Added: 1)  Flu Vaccine 72yrs + MEDICARE PATIENTS [Q2039] 2)  Administration Flu vaccine - MCR [G0008]    Flu Vaccine Consent Questions     Do you have a history of severe allergic reactions to this vaccine? no    Any prior history of allergic reactions to egg and/or gelatin? no    Do you have a sensitivity to the preservative Thimersol? no    Do you have a past history of Guillan-Barre Syndrome? no    Do you currently have an acute febrile illness? no    Have you ever had a severe reaction to latex? no    Vaccine information given and explained to patient? yes    Are you currently pregnant? no    Lot Number:[id number]   Exp Date:12/10/2010   Manufacturer: [employer]    Site Given  Left Deltoid IM

## 2010-07-14 NOTE — Progress Notes (Signed)
Summary: ? Problem with medication  Phone Note Call from Patient Call back at Home Phone [phone]   Caller: Spouse Call For: [name] Salt MD Summary of Call: Patient's wife came in for her B-12 shot today and requested that a message be sent to you regarding her husband. [name] is concerned that the anti-depressant medication that patient  is on is causing him to be hyper. Patient's wife states that he follows her thru the house and ask her the same questions all day long. Wife states that he is not sleeping at night and is up and down all night long.  [name] states that patient gets angry at the least little thing. [name] states that he has gotten turned around while driving and has ended up in the wrong place.  The reason she hasn't called before today is because he stays under her feet all the time and she felt that she needed to explain this to you without him being present because he get upset with her easily.   [employer], [place]  Initial call taken by: Sydell Axon LPN,  May 31, 2010 9:03 AM  Follow-up for Phone Call        I am not sure whether the medication is causing this or the Altzheimers The medication is supposed to help this type of behavior but occ can make it worse Please have her try just 1/2 of the citalopram for now set up appt in 1 week or so to review----we may need to try a different type of medication for him Follow-up by: [name] Salt MD,  May 31, 2010 9:43 AM  Additional Follow-up for Phone Call Additional follow up Details #1::        Spoke with patient's wife and advised results. Wife will call after the holidays for an appt. Additional Follow-up by: Mervin Hack CMA Duncan Dull),  May 31, 2010 12:37 PM

## 2010-07-16 ENCOUNTER — Encounter: Payer: Self-pay | Admitting: Internal Medicine

## 2010-07-25 ENCOUNTER — Telehealth: Payer: Self-pay | Admitting: Internal Medicine

## 2010-08-02 ENCOUNTER — Ambulatory Visit: Payer: Medicare Other

## 2010-08-02 ENCOUNTER — Encounter: Payer: Self-pay | Admitting: Internal Medicine

## 2010-08-02 DIAGNOSIS — Z111 Encounter for screening for respiratory tuberculosis: Secondary | ICD-10-CM

## 2010-08-03 NOTE — Progress Notes (Signed)
Summary: needs to increase citalopram dose  Phone Note Call from Patient Call back at Home Phone [phone]   Caller: Spouse  Peggy Summary of Call: Pt's wife states he is very agitated with knowing that he will be going to memory care.  She is asking if his citalopram dose can be increased until he gets admitted and settled down.  [facility]ses [facility] [address] Initial call taken by: Lowella Petties CMA, AAMA,  July 25, 2010 2:28 PM  Follow-up for Phone Call        that will not help in immediate sense  I would try alprazolam 0.25 1/2-1 tab three times a day as needed for agitation #30 x 0 This should calm him better when he gets agitated Follow-up by: Cindee Salt MD,  July 25, 2010 2:37 PM  Additional Follow-up for Phone Call Additional follow up Details #1::        Rx Called In, Spoke with patient's wife and advised results.  Additional Follow-up by: Mervin Hack CMA Duncan Dull),  July 25, 2010 4:41 PM    New/Updated Medications: ALPRAZOLAM 0.25 MG TABS (ALPRAZOLAM) 1/2-1 tab three times a day as needed for agitation Prescriptions: ALPRAZOLAM 0.25 MG TABS (ALPRAZOLAM) 1/2-1 tab three times a day as needed for agitation  #30 x 0   Entered by:   Mervin Hack CMA (AAMA)   Authorized by:   Cindee Salt MD   Signed by:   Mervin Hack CMA (AAMA) on 07/25/2010   Method used:   Telephoned to ...       [facility] [address] [phone]* (retail)       [address] [place], [place]  [address]       Ph: [phone]       Fax: [phone]   RxID:   [id number]

## 2010-08-03 NOTE — Progress Notes (Signed)
Summary: pt needs PPD  Phone Note Call from Patient Call back at Home Phone [phone]   Caller: Spouse Peggy Summary of Call: Pt's wife is trying to get him into memory care at [facility] and he will need a TB test.  OK to schedule?              Lowella Petties CMA, AAMA  July 25, 2010 8:24 AM   Follow-up for Phone Call        okay to schedule Follow-up by: Cindee Salt MD,  July 25, 2010 1:41 PM  Additional Follow-up for Phone Call Additional follow up Details #1::        Advised pt's wife ok to schedule.  She will call back to schedule nurse visit. Additional Follow-up by: Lowella Petties CMA, AAMA,  July 25, 2010 2:25 PM

## 2010-08-04 ENCOUNTER — Encounter (INDEPENDENT_AMBULATORY_CARE_PROVIDER_SITE_OTHER): Payer: Self-pay | Admitting: *Deleted

## 2010-08-05 ENCOUNTER — Telehealth: Payer: Self-pay | Admitting: Internal Medicine

## 2010-08-09 ENCOUNTER — Telehealth: Payer: Self-pay | Admitting: Internal Medicine

## 2010-08-09 NOTE — Progress Notes (Signed)
Summary: requests xanax  Phone Note Call from Patient Call back at Home Phone [phone]   Caller: Spouse Peggy Summary of Call: Pt's wife is asking if an order for xanax can be sent to [facility].  She says this helps calm him down.  He is at memory care 2 days a week. Initial call taken by: Lowella Petties CMA, AAMA,  August 05, 2010 2:14 PM  Follow-up for Phone Call        I put that on his admission paper work that was just done  You will need to provide the medication and discuss this with Clydie Braun there Follow-up by: Cindee Salt MD,  August 05, 2010 2:16 PM  Additional Follow-up for Phone Call Additional follow up Details #1::        left detailed message on home answering machine, advised pt to call us if any questions or to speak with Clydie Braun at [facility]. DeShannon Smith CMA Duncan Dull)  August 05, 2010 3:37 PM

## 2010-08-09 NOTE — Miscellaneous (Signed)
Summary: PPD given   Clinical Lists Changes  Orders: Added new Service order of TB Skin Test [phone]) - Signed Added new Service order of Admin 1st Vaccine (13244) - Signed Observations: Added new observation of TB-PPD LOT#: [id number] (08/02/2010 10:35) Added new observation of TB-PPD EXP: 02/11/2012 (08/02/2010 10:35) Added new observation of TB-PPD BY: Lowella Petties CMA, AAMA (08/02/2010 10:35) Added new observation of TB-PPD RTE: ID (08/02/2010 10:35) Added new observation of TB-PPD DSE: 0.1 ml (08/02/2010 10:35) Added new observation of TB-PPD MFR: Sanofi Pasteur (08/02/2010 10:35) Added new observation of TB-PPD SITE: left forearm (08/02/2010 10:35) Added new observation of TB-PPD: PPD (08/02/2010 10:35)      Immunizations Administered:  PPD Skin Test:    Vaccine Type: PPD    Site: left forearm    Mfr: Sanofi Pasteur    Dose: 0.1 ml    Route: ID    Given by: Lowella Petties CMA, AAMA    Exp. Date: 02/11/2012    Lot #: [id number]

## 2010-08-09 NOTE — Miscellaneous (Signed)
Summary: PPD read   Clinical Lists Changes  Observations: Added new observation of TB PPDRESULT: negative (08/04/2010 10:14) Added new observation of PPD RESULT: < 5mm (08/04/2010 10:14) Added new observation of TB-PPD RDDTE: 08/04/2010 (08/04/2010 10:14)      PPD Results    Date of reading: 08/04/2010    Results: < 5mm    Interpretation: negative

## 2010-08-18 NOTE — Progress Notes (Signed)
SummaryPrudy [name]  Phone Note Refill Request Message from:  [facility] [phone] on August 09, 2010 9:42 AM  Refills Requested: Medication #1:  ALPRAZOLAM 0.25 MG TABS 1/2-1 tab three times a day as needed for agitation.   Last Refilled: 07/25/2010 E-Scribe Request    Method Requested: Telephone to Pharmacy Initial call taken by: Mervin Hack CMA Duncan Dull),  August 09, 2010 9:42 AM  Follow-up for Phone Call        Spoke with patient wife about refill, and per wife she needs a rx a home so she can give pt one before he leaves every morning. I advised her she needs to speak with Clydie Braun but per wife pt needs xanax every morning before he leaves. Please advise. DeShannon Katrinka Blazing CMA Duncan Dull)  August 09, 2010 9:43 AM   Okay to refill #60 x 0 Sig is up to three times a day and he has been getting worked up before going to the [facility] Follow-up by: Cindee Salt MD,  August 09, 2010 2:15 PM  Additional Follow-up for Phone Call Additional follow up Details #1::        Spoke with patient and advised results. rx called to pharmacy. Additional Follow-up by: Mervin Hack CMA Duncan Dull),  August 09, 2010 3:49 PM    Prescriptions: ALPRAZOLAM 0.25 MG TABS (ALPRAZOLAM) 1/2-1 tab three times a day as needed for agitation  #60 x 0   Entered by:   Mervin Hack CMA (AAMA)   Authorized by:   Cindee Salt MD   Signed by:   Mervin Hack CMA (AAMA) on 08/09/2010   Method used:   Telephoned to ...       [facility] [address] [phone]* (retail)       [address] [place], [place]  [id number]       Ph: [phone]       Fax: [phone]   RxID:   [id number]

## 2010-09-01 ENCOUNTER — Encounter: Payer: Self-pay | Admitting: Internal Medicine

## 2010-09-01 ENCOUNTER — Ambulatory Visit (INDEPENDENT_AMBULATORY_CARE_PROVIDER_SITE_OTHER): Payer: Medicare Other | Admitting: Internal Medicine

## 2010-09-01 VITALS — BP 114/60 | HR 62 | Temp 98.7°F | Ht 68.0 in | Wt 203.0 lb

## 2010-09-01 DIAGNOSIS — G309 Alzheimer's disease, unspecified: Secondary | ICD-10-CM

## 2010-09-01 DIAGNOSIS — M129 Arthropathy, unspecified: Secondary | ICD-10-CM

## 2010-09-01 DIAGNOSIS — F411 Generalized anxiety disorder: Secondary | ICD-10-CM

## 2010-09-01 DIAGNOSIS — I10 Essential (primary) hypertension: Secondary | ICD-10-CM

## 2010-09-01 NOTE — Progress Notes (Signed)
  Subjective:    Patient ID: [name] [name], male    DOB: 1927-01-25, 77 y.o.   MRN: [id number]  HPI Has had more anxiety with recent changes. Had to adjust to going to adult day care He states he "only goes to work---grading and stuff" Wife has added regular xanax and this has really helped No sig depression  Wife notes fairly stable cognitive status Still doesn't require any help with personal care--wife just makes sure he does it  Doesn't check BP No headaches No chest pain or SOB No change in exercise tolerance  No sig arthritis pain Occ has leg pain--tylenol seems to help (but hard to tell)  Past Medical History  Diagnosis Date  . GERD (gastroesophageal reflux disease)   . Hypertension   . Anxiety   . Alzheimer disease     Past Surgical History  Procedure Date  . Appendectomy 1980  . Eye surgery 2005    laser eye  . Back surgery 1982, 1992  . Shoulder surgery 2004    right  . Cataract extraction, bilateral 2004    Family History  Problem Relation Age of Onset  . Asthma Mother   . Alzheimer's disease Mother   . Kidney disease Father   . COPD Brother   . Alzheimer's disease Brother   . Arthritis Brother     History   Social History  . Marital Status: Married    Spouse Name: N/A    Number of Children: 2  . Years of Education: N/A   Occupational History  . grading work    Social History Main Topics  . Smoking status: Former Smoker -- 50 years    Types: Cigarettes  . Smokeless tobacco: Not on file  . Alcohol Use: No  . Drug Use: Not on file  . Sexually Active: Not on file   Other Topics Concern  . Not on file   Social History Narrative   No living will, would accept resuscitation ( discussion 11/06)     Review of Systems Appetite is great Weight is stable--or may be up a touch Sleeping well Voids okay    Objective:   Physical Exam  Constitutional: He appears well-developed and well-nourished. No distress.  Neck: Normal range of  motion. Neck supple. No JVD present.  Cardiovascular: Normal rate, regular rhythm, normal heart sounds and intact distal pulses.  Exam reveals no gallop.   No murmur heard. Pulmonary/Chest: Effort normal and breath sounds normal. No respiratory distress. He has no wheezes. He has no rales.  Lymphadenopathy:    He has no cervical adenopathy.  Neurological:       Memory is poor--thinks he still works No awareness of going to adult day care at [facility]  Psychiatric: He has a normal mood and affect.       Delusional about still working          Assessment & Plan:

## 2010-09-13 ENCOUNTER — Other Ambulatory Visit: Payer: Self-pay | Admitting: Internal Medicine

## 2010-09-19 ENCOUNTER — Other Ambulatory Visit: Payer: Self-pay | Admitting: Internal Medicine

## 2010-09-19 NOTE — Telephone Encounter (Signed)
Okay #90 x 0 

## 2010-09-19 NOTE — Telephone Encounter (Signed)
rx called into pharmacy

## 2010-09-19 NOTE — Telephone Encounter (Signed)
Ok to refill 

## 2010-09-27 ENCOUNTER — Telehealth: Payer: Self-pay | Admitting: *Deleted

## 2010-09-27 NOTE — Telephone Encounter (Signed)
[name] [name] at [facility] needs order to give pt his alprazolam during the day.  Order needs to be specific at to what time of the day pt needs to take the medicine.  Please fax order to [phone].

## 2010-09-27 NOTE — Telephone Encounter (Signed)
Note written Should only be prn

## 2010-09-27 NOTE — Telephone Encounter (Signed)
Order faxed and scanned.

## 2010-10-11 ENCOUNTER — Other Ambulatory Visit: Payer: Self-pay | Admitting: Internal Medicine

## 2010-10-25 NOTE — Op Note (Signed)
NAME:  [name] [name], WOOLUM NO.:  [id number]   MEDICAL RECORD NO.:  [id number]          PATIENT TYPE:  OIB   LOCATION:  1534                         FACILITY:  [facility]   PHYSICIAN:  Ardeth Sportsman, MD     DATE OF BIRTH:  07-27-1926   DATE OF PROCEDURE:  11/21/2007  DATE OF DISCHARGE:                               OPERATIVE REPORT   PRIMARY CARE PHYSICIAN:  Karie Schwalbe, M.D.   GASTROENTEROLOGIST:  Rachael Fee, M.D. of [facility].   PREOPERATIVE DIAGNOSIS:  Large paraesophageal hernia with nausea,  vomiting, and reflux.   POSTOPERATIVE DIAGNOSIS:  Large paraesophageal hernia with nausea,  vomiting, and reflux.   SURGEON:  Ardeth Sportsman, M.D.   ASSISTANT:  Leonie Man, M.D.   PROCEDURES PERFORMED:  1. Laparoscopic type 2 mediastinal dissection  2. Laparoscopic paraesophageal hernia repair, with FlexHD      bioabsorbable thick mesh, buttress reinforcement.  3. Laparoscopic Nissen fundoplication over a 56-French bougie.   ANESTHESIA:  1. General anesthesia.  2. Local anesthetic and a field block around port sites.   SPECIMENS:  None.   DRAINS:  A 19-French Blake drain has most of the tip in the anterior  mediastinum and comes out a right upper quadrant port site.   ESTIMATED BLOOD LOSS:  Less than 50 mL.   COMPLICATIONS:  None apparent.   INDICATIONS:  [name] is a 49 year old gentleman who was rather  functional who was found to have after a severe episode of nausea and  vomiting a very large paraesophageal hernia.  He was sent for surgical  complication back in May 2008.  When I had seen him, he had not had any  other episodes and was doing well.  I decided that since he seemed to  have adequate control on his proton pump inhibitors, despite his large  paraesophageal hernia, I thought surgery was not warranted at this time.   However, over the past 8 months, he has had more frequent episodes of  nausea, vomiting, and  discomfort, and evidence of some anemia as well.  Because of worsening symptomatology and failure on maximal medical  therapy, reconsideration was made.  Anatomy and physiology of  esophagogastric function and antireflux mechanism was discussed.  Pathophysiology of herniation with its risks of incarceration,  strangulation, nausea, vomiting, dehydration, bleeding, ulceration, and  other risks were discussed by its natural history.  Options were  discussed, and recommendation was made for laparoscopic paraesophageal  hernia repair, with Nissen fundoplication to rebuild the antireflux  mechanism.  Risks, benefits, and alternatives were discussed in detail,  including but not limited to stroke, heart attack, deep venous  thrombosis, pulmonary embolism, death, leak, and need for reoperation,  hernia recurrence, prolonged pain, and other risks.  Questions were  answered, and he agreed to proceed.   OPERATIVE FINDINGS:  He had a large paraesophageal hernia defect, which  I would say was about 10 x 12 cm in size, with about 3/4 of his dilated  stomach up in his mediastinum.  His esophagus was somewhat shortened but  was able to get adequate  intraabdominal esophageal length.   DESCRIPTION OF PROCEDURE:  Informed consent was confirmed.  The patient  received IV antibiotics just prior to surgery.  He underwent general  anesthesia without any difficulty.  He was placed supine, both arms  tucked, on a split-leg table.  A Foley catheter was sterilely placed.  His abdomen was clipped, prepped, and draped in a sterile fashion.  He  had sequential compression devices, SCDs, active during the entire case.   A 5 mm port was placed in the left upper quadrant paramedian with the  patient in steep reverse Trendelenburg and left side up using optical  entry technique.  An orogastric tube had been used to decompress stomach  prior to this.  Entry was gained without any difficulty.  Capnoperitoneum to 15 mmHg  provided good intra-abdominal insufflation.  Under direct visualization, a 10 mm port was placed along the left  subcostal ridge.  A 5 mm port was placed in the right upper quadrant.  Another 5 mm port was placed just left of subxiphoid, removed, and an  omega-shaped Nathanson rigid liver retractor was used to lift the left  lateral sector of the liver anteriorly.  It was secured to the table  through a Acupuncturist.  A final 5 mm port was placed in the right  subxiphoid region.   Camera inspection revealed the findings noted above.  I tried to get  some of the stomach down, but it automatically rubber-banded up into the  chest.  I therefore took the anterior mediastinal sac with harmonic and  blunt dissection, first just at the apex. and slowly marching down the  right and left anterior crus.  Over time, we were able to get release of  the suction and help better reduce the stomach back into the abdominal  cavity.   I went ahead and took the short gastric attachments between the proximal  greater curvature of stomach and the spleen using ultrasonic dissection  to help and follow it up the greater curvature of the stomach until we  got up to the cardia.  This allowed a window to see the posterior  mediastinum and further free off our posterior hernia sac off the left  crus and find the base of the left crus.  We could actually kind of see  the base of the right crus.   With this, I was able to put more axial tension towards the left hip and  better free off the phrenoesophageal attachments along the right crus,  all the way down to its base.  A type 2 mediastinal dissection was  performed to get enough adequate anterior abdominal esophageal length.  I could see his posterior vagus very well, and it was preserved at all  times.  His anterior vagus had some more wispy attachments, but I was  able to save a couple of the major branches rather easily.  It did have  some dense  attachments to his pleura; I would not say thick, but just  numerous adhesions to his pleura, but I was able to free those off  without any breaches in his pleura.  With that, I was able to get much  better intraabdominal esophageal length.   The anterior hernia sac was freed off to the left of the anterior vagus  nerve to help better define the esophagogastric junction, especially at  the angle of His.  The right anterior hernia sac was very thickened and  intimately involved with the lesser curvature  of his stomach.  We  basically used harmonic dissection help peel it off on the proximal  esophagus and at the esophagogastric junction and then stopped at his  proximal lesser curve and just pushed that back to help preserve any  anterior vagus branches, as these were seen and preserved.   With this dissection, I was able to get about 4 cm intra-abdominal  esophageal length off tension.  The cardia of the stomach was brought  retroesophageally over to the right side, and a classic shoeshine  maneuver was used to have a nice posterior wrap, with no twisting or  turning.  The stomach was reflected over towards the left upper quadrant  to better expose the esophageal hiatus.   The esophageal hiatus was closed using a #1 Ethibond horizontal mattress  stitches using pledgets x3 and a single #1 Ethibond stitch as well to  get a good snug closure posteriorly.  The closure was reinforced using a  FlexHD thick 6 x 12 cm mesh.  It was cut to be a U shape.  It was  secured to the crural closure using #1 Ethibond stitches x2.  Those  stitches were used later to help secure the posterior wall of the wrap  for good posterior gastropexy x2.  Note that the mesh was secured at the  corners using 0 Ethibond interrupted stitches to help bring a nice U  shape hugging around the esophagus and make sure that the tails of the U  were pointing up anteriorly and at the level of the height of the apex  of the  crus.  Care was made that the mesh was not too snug around the  esophagus.   The wrap was brought again around towards the right side, and posterior  gastropexy was done, as noted above, by taking large #1 Ethibond crural  stitches and putting a throw on the posterior side of the wrap.  A left  anterior gastropexy was done as well, taking the most superior aspect of  the greater curvature of the stomach on the left side of the wrap, but  securing it to the left apex of the crura.  I also took a bite of corner  of the FlexHD bioabsorbable mesh as well to help hold that in place.  This helped set the wrap well for completion.  Two esophagogastric  internal plication stitches were done (Guarner) such that the inner side  of the left wrap was secured to the esophagus at around 3 o'clock in the  lithotomy position.  A similar stitch was done in a mirror-image  fashion on the right side.   A 56-French lighted bougie was passed down under axial tension, without  any difficulty or resistance.  A three- stitch Nissen fundoplication was  done, taking bites of the anterolateral aspect of the left side of the  wrap, an anterior bite of esophagus, and a similar bite on the right  side of the wrap.  Bites were near the greater curvature, but not all  the way over.  Measured length was 2 cm for the three stitches total.  It was definitely on esophagus.  Mesh laid well, with no twisting or  turning, overlying slightly right posterior in the classic position.  Bougie was removed intact.   A 19-French Blake drain was passed into the mediastinum, and the drain  was brought out the right upper quadrant port site.  Capnoperitoneum was  evacuated and the ports removed.  The 10 mm  fascial defect had slid up  above the costal ridge and was not more aggressively closed.  Skin was  closing using 4-0 Monocryl stitch.  A drain stitch was used with 2-0  nylon stitch.  A sterile dressing was applied.  The patient  was  extubated and taken to the recovery room in stable condition.   I explained the operative findings to the patient's family.      Ardeth Sportsman, MD  Electronically Signed     SCG/MEDQ  D:  11/21/2007  T:  11/21/2007  Job:  [id number]   cc:   Karie Schwalbe, MD  [address] [place], [place] [address]   Rachael Fee, MD  [address]  [place], [place] [address]

## 2010-10-25 NOTE — Assessment & Plan Note (Signed)
[facility]                         GASTROENTEROLOGY OFFICE NOTE   SHAFIN, POLLIO                    MRN:          [id number]  DATE:07/22/2007                            DOB:          28-Apr-1927    PRIMARY CARE PHYSICIAN:  Karie Schwalbe, M.D.   GENERAL SURGEON:  Ardeth Sportsman, M.D.   GI PROBLEM LIST:  Large paraesophageal hernia, status post  esophagogastroduodenoscopy April, 2008 showing this hernia.  No  Cameron's ulcerations.  Hernia likely contributes to chronic  gastroesophageal reflux disease.   INTERVAL HISTORY:  I last saw [name] [name] in the spring of 2008.  I  diagnosed him with a large paraesophageal hernia and referred him to Dr.  Estelle Grumbles to consider repairing the hernia.  [name] [name] had really  only one bad vomiting illness and his GERD symptoms at that time were  under fairly good control.  He did not have many abdominal pains or  chronic complaints of just nausea or intermittent vomiting.  Since then,  however, he is becoming more bothered by his daily GERD.  He says his  stomach feels uneasy most of the time, nauseous and sick on his stomach.  He has heartburn every day, even on daily PPI and H2 blockers.  He has  had no overt vomiting.  No dysphagia.  No dramatic abdominal pains,  however.   CURRENT MEDICATIONS:  1. Protonix 40 mg once daily.  2. Plendil.  3. Senokot.  4. PreserVision.  5. Darvocet.   PHYSICAL EXAMINATION:  VITAL SIGNS:  Weight is 204 pounds which is  unchanged since his last visit in April of 2008.  Blood pressure is  128/72, pulse 68.  CONSTITUTIONAL:  Generally well-appearing.  ABDOMEN:  Soft, nontender, nondistended. Normal bowel sounds.   ASSESSMENT/PLAN:  77 year old man with paraesophageal hernia,  chronic gastroesophageal reflux disease.   [name] [name] has been having daily pyrosis and rather chronic  intermittent nausea.  He also has a left mid to lower quadrant  pain that  has been going on for a year or so as well.  I think at least some of  these symptoms are from his known large paraesophageal hernia.  He does  seem to be betting frustrated with the chronic nature of his complaints  and I think revisiting the possibility of a hernia repair is reasonable  at this point.  He has not had any dramatic events to suggest gastric  outlet obstruction.  I will therefore arrange for him to be seen by Dr.  Michaell Cowing again in  consultation.  In the meantime, he will continue taking his proton pump  inhibitor and H2 blocker on a daily basis.     Rachael Fee, MD  Electronically Signed    DPJ/MedQ  DD: 07/22/2007  DT: 07/23/2007  Job #: [id number]   cc:   Karie Schwalbe, MD  Ardeth Sportsman, MD

## 2010-10-28 NOTE — Assessment & Plan Note (Signed)
[facility]                         GASTROENTEROLOGY OFFICE NOTE   [name], [name]                    MRN:          [id number]  DATE:09/11/2006                            DOB:          [birth year]    REFERRING PHYSICIAN:  Karie Schwalbe, MD   REASON FOR REFERRAL:  Dr. Alphonsus Sias asked me to evaluate [name] [name] in  consultation regarding recent coffeeground hematemesis.   HISTORY OF PRESENT ILLNESS:  [name] [name] is a very pleasant, 77-year-  old man who has had heartburn for many years. He has been taking  Protonix on a daily basis which he takes after his dinner meal on a  daily basis. He has known about a large hiatal hernia for at least  several years and has never had symptoms besides GERD that were  attributable to the hernia. Last week he was working on his lawn and had  some nausea and vomited normal gastric contents 2-3 times. His last  emesis seemed to have a very dark coffeeground look to it. This was on  Friday and he presented to the emergency room that day. Lab tests showed  a slight white count of 11.9000, his hemoglobin was normal, his  platelets were normal. A complete metabolic profile was normal except  for a total bilirubin of 1.7. He had a portable chest x-ray which  suggested a large left-sided paraesophageal hernia. He was  hemodynamically stable, his nausea and vomiting had resolved. He went  home from the emergency room.  Saturday, Sunday and Monday, he had no  recurrence of nausea or vomiting. He has had no melena and no changes in  his bowels whatsoever.   REVIEW OF SYSTEMS:  Essentially normal and is available on his nursing  intake sheet.   PAST MEDICAL HISTORY:  1. Multiple back surgeries.  2. Hypertension.  3. Arthritis.   CURRENT MEDICATIONS:  Protonix, Plendil, PreserVision, Darvocet on a  p.r.n. basis, Senokot daily.   ALLERGIES:  No known drug allergies.   SOCIAL HISTORY:  Married with 2 children,  self employed, nonsmoker,  nondrinker.   FAMILY HISTORY:  No colon cancer or colon polyps in family.   PHYSICAL EXAMINATION:  VITAL SIGNS:  5 foot 10 inches, 204 pounds, blood  pressure 128/66, pulse 72.  CONSTITUTIONAL:  Generally well appearing.  NEUROLOGIC:  Alert and oriented x3.  EYES:  Extraocular movements intact.  MOUTH:  Oropharynx moist, no lesions.  NECK:  Supple, no lymphadenopathy.  CARDIOVASCULAR:  Heart regular rate and rhythm.  LUNGS:  Clear to auscultation bilaterally.  ABDOMEN:  Soft, nontender, nondistended, normal bowel sounds.  EXTREMITIES:  No lower extremity edema.  SKIN:  No rashes or lesions on visible extremities.   ASSESSMENT/PLAN:  A 77 year old man with recent nausea, vomiting,  eventual coffeeground hematemesis, self limited, large hiatal hernia  likely with paraesophageal on chest x-ray.   [name] [name] has known about a large hiatal hernia for many years. It is  not clear that this recent nausea and vomiting episode was related to  the hiatal hernia although I suspect his chronic gastroesophageal reflux  disease symptoms are related  to the hiatal hernia. He takes Protonix on  a once daily basis but probably not at the right time in relation to  food so I have instructed him and his wife that he should take it 20-30  minutes prior breakfast on a daily basis. I will also arrange for him to  have a EGD performed at his soonest convenience to evaluate this stomach  for other possible causes of limited hematemesis such as ulcer disease,  neoplasm although I think it is very unlikely. I let him know very  clearly that if he has severe nausea, vomiting and abdominal pain he  should always present for evaluation since a paraesophageal hernia is at  risk for torsion and volvulus and that would be a surgical emergency. I  see no reason for any further blood tests or imaging studies and so I  will see him at the time of his upper endoscopy.     Rachael Fee, MD  Electronically Signed    DPJ/MedQ  DD: 09/11/2006  DT: 09/11/2006  Job #: [id number]   cc:   Karie Schwalbe, MD

## 2010-10-28 NOTE — Assessment & Plan Note (Signed)
[facility]                         GASTROENTEROLOGY OFFICE NOTE   RAMSAY, BOGNAR                    MRN:          [id number]  DATE:09/02/2007                            DOB:          1927/05/29    Esophageal manometry was performed in the usual fashion last week on  08/27/07. There was difficulty locating his esophageal gastric junction  likely related to his large hiatal hernia. Tracings that were obtained  did not show any significant esophageal motor dysfunction and in fact  showed normal peristaltic contractions 100% of swallows and no  particularly high or low pressures. I think this examination shows that  his esophagus has no significant underlying motility disorder which  would preclude him from fundoplication procedure. I will communicate the  results of this examination with Dr. Estelle Grumbles.     Rachael Fee, MD  Electronically Signed    DPJ/MedQ  DD: 09/02/2007  DT: 09/02/2007  Job #: [id number]   cc:   Ardeth Sportsman, MD

## 2010-10-28 NOTE — Op Note (Signed)
[facility]  Patient:    TRIGG, DELAROCHA Visit Number: [id number] MRN: [id number]          Service Type: DSU Location: DAY Attending Physician:  Thermon Leyland Proc. Date: 03/06/01 Admit Date:  03/06/2001   CC:         Loma Sender, M.D.   Operative Report  PREOPERATIVE DIAGNOSIS:  Left proximal ureteral calculus.  POSTOPERATIVE DIAGNOSIS:  Left proximal ureteral calculus.  PROCEDURE PERFORMED:  Cystoscopy, left retrograde pyelography, ureteroscopy, stone-basketing and double-J stent placement.  SURGEON:  Barron Alvine, M.D.  ANESTHESIA:  General.  INDICATIONS:  Mr. Adorno is a 77 year old male.  He originally presented to see me approximately two weeks ago.  He had been having intermittent problems with left-sided abdominal and flank discomfort.  This pain had been ongoing for several weeks, but it intensified, and he presented to the [facility] Emergency Room.  A CT scan showed about a 5-6 mm proximal left ureteral stone.  We talked at that time about possible intervention, and the patient elected to have something done immediately.  For that reason, we took him over to lithotripsy within a day or two.  One could see the stone initially on plain x-ray but when we got the patient to the lithotriptor, we were unable to see the stone.  We felt that this was because of increased bowel gas and potentially due to some interference with the bony structures.  We gave the patient intravenous contrast but were still unable to identify the stone.  The patient continued to have pain, and we felt the stone was still present.  For that reason, we elected to proceed with plan B which was ureteroscopy with basketing and/or holmium laser lithotripsy which he now presents now for.  TECHNIQUE AND FINDINGS:  The patient was brought to the operating room where he had successful induction of general anesthesia.  He was placed in the lithotomy position,  and prepped and draped in the usual manner.  Cystoscopy revealed unremarkable anterior urethra.  He had mild trilobar hyperplasia. The bladder was endoscopically normal.  Retrograde pyelography revealed a questionable filling defect in the junction of the mid proximal ureter.  A guidewire was placed in the renal pelvis without difficulty.  A 6.5 French long ureteroscope was then engaged in the ureter.  Approximately two-thirds of the way up, a 5-6 mm stone was encountered.  Our initial attempt at basket extraction resulted in the stone popping out of the basket near the ureterovesical junction.  When we went back with the ureteroscope, the stone was in two pieces, and each of these pieces were easily basket-extracted. There was no evidence of any ureteral injury.  Over the guidewire, we placed a 7 French 26 cm double-J stent with a dangle string which was secured to the patients penis.  A B&O suppository was used as was some lidocaine jelly.  He was brought to the recovery room in stable condition. Attending Physician:  Thermon Leyland DD:  03/06/01 TD:  03/06/01 Job: [id number]

## 2010-10-28 NOTE — Discharge Summary (Signed)
NAMEREINHART, SAULTERS NO.:  [id number]   MEDICAL RECORD NO.:  [id number]          PATIENT TYPE:  INP   LOCATION:  1619                         FACILITY:  [facility]   PHYSICIAN:  Ardeth Sportsman, MD     DATE OF BIRTH:  Apr 22, 1927   DATE OF ADMISSION:  11/21/2007  DATE OF DISCHARGE:  11/24/2007                               DISCHARGE SUMMARY   PRIMARY CARE PHYSICIAN:  Karie Schwalbe, M.D.   GASTROENTEROLOGIST:  Rachael Fee, M.D.   SURGEON:  Ardeth Sportsman, M.D.   FINAL DIAGNOSIS:  Giant paraesophageal hernia with severe heartburn,  dysphagia and vomiting.   PROCEDURE PERFORMED:  Laparoscopic paraesophageal hernia repair with  type 2 mediastinal dissection and Nissen fundoplication on November 21, 2007.   SUMMARY OF HOSPITAL COURSE:  Mr. Oshields is a pleasant elderly  gentleman who had a giant paraesophageal hernia that became increasing  symptomatic despite aggressive medical control.  He underwent surgery to  repair this.  Postoperatively, he was hemodynamically stable, but he did  have some mild confusion and delirium issues.  He was monitored for  this.  He had a postoperative swallow study which showed no evidence of  any leak and appropriately tight but not completely restrictive.  He was  advanced on his diet and was tolerating a pureed diet at the time of  discharge.   He was monitored an extra few days and his mental status improved.  His  pain was adequately controlled, he was walking in the hallways well and  urinating on his own.  Based on improvements, it was thought he could be  reasonably discharged home with the following instructions.   DISCHARGE INSTRUCTIONS:  1. He is return to clinic to see me in about 2-3 weeks.  2. He should have a pureed or blenderized diet for at least 2 weeks      and keep his medications crushed for at least the first, if not      both weeks.  He had an extensive discussion held with he and his      family, as  well as a handout on post esophageal surgery diet      advancement, as well.  3. He should call if he has any fevers, chills, sweats, nausea,      vomiting or worsening pain.  He can take Reglan 10 mg p.o. q.6h.      pulse rate nausea and vomiting or Phenergan 25 mg suppositories PR      q.6h. p.r.n. nausea or vomiting.  4. He can take narcotics as prescribed and heating pad as needed for      pain control/  5. He should walk at least a 1/2 hour a day to remain physically      active, and he should call if he has any fevers, chills, sweats,      nausea, vomiting, uncontrolled pain or other concerns.      Ardeth Sportsman, MD  Electronically Signed     SCG/MEDQ  D:  12/03/2007  T:  12/03/2007  Job:  [id number]  cc:   Karie Schwalbe, MD  [address] [place], [place] [address]   Rachael Fee, MD  [address]  [place], [place] [address]

## 2010-11-04 ENCOUNTER — Other Ambulatory Visit: Payer: Self-pay | Admitting: *Deleted

## 2010-11-04 MED ORDER — ALPRAZOLAM 0.25 MG PO TABS: ORAL_TABLET | ORAL | Status: DC

## 2010-11-04 NOTE — Telephone Encounter (Signed)
rx called to pharmacy 

## 2010-11-08 ENCOUNTER — Other Ambulatory Visit: Payer: Self-pay | Admitting: Internal Medicine

## 2010-11-08 NOTE — Telephone Encounter (Signed)
rx already refilled by Dr.Aron on 11/04/2010

## 2010-11-09 ENCOUNTER — Telehealth: Payer: Self-pay | Admitting: *Deleted

## 2010-11-09 NOTE — Telephone Encounter (Signed)
Wife wants to turn in husband's drivers licence because she feels that it is unsafe for him to drive. She needs a letter sating that due to patient medical history it is unsafe for him to drive so that she can turn his licence in. Please call Peggy when letter is done.

## 2010-11-10 ENCOUNTER — Encounter: Payer: Self-pay | Admitting: Internal Medicine

## 2010-11-10 DIAGNOSIS — Z0279 Encounter for issue of other medical certificate: Secondary | ICD-10-CM

## 2010-11-10 NOTE — Telephone Encounter (Signed)
Letter written  $20 charge applies

## 2010-11-14 ENCOUNTER — Other Ambulatory Visit: Payer: Self-pay | Admitting: Internal Medicine

## 2010-12-19 ENCOUNTER — Other Ambulatory Visit: Payer: Self-pay | Admitting: *Deleted

## 2010-12-19 MED ORDER — ALPRAZOLAM 0.25 MG PO TABS: 0.2500 mg | ORAL_TABLET | Freq: Three times a day (TID) | ORAL | Status: DC | PRN

## 2010-12-19 NOTE — Telephone Encounter (Signed)
Okay #90 x 0 

## 2010-12-19 NOTE — Telephone Encounter (Signed)
Received faxed refill request from pharmacy.

## 2010-12-19 NOTE — Telephone Encounter (Signed)
rx called into pharmacy

## 2011-02-02 ENCOUNTER — Other Ambulatory Visit: Payer: Self-pay | Admitting: Internal Medicine

## 2011-02-02 NOTE — Telephone Encounter (Signed)
Okay #90 x 0 

## 2011-02-03 NOTE — Telephone Encounter (Signed)
rx called into pharmacy

## 2011-02-07 ENCOUNTER — Telehealth: Payer: Self-pay | Admitting: *Deleted

## 2011-02-07 NOTE — Telephone Encounter (Signed)
[facility] is getting auditied by the state right now and Clydie Braun needs a updated order stating the mg of Xanax pt is taking, and the times, please fax order to [phone].

## 2011-02-07 NOTE — Telephone Encounter (Signed)
Put on specific times as the auditors were requesting for adult day care setting

## 2011-02-07 NOTE — Telephone Encounter (Signed)
Order faxed.

## 2011-02-21 ENCOUNTER — Ambulatory Visit (INDEPENDENT_AMBULATORY_CARE_PROVIDER_SITE_OTHER): Payer: Medicare Other | Admitting: Internal Medicine

## 2011-02-21 ENCOUNTER — Encounter: Payer: Self-pay | Admitting: Internal Medicine

## 2011-02-21 VITALS — BP 158/73 | HR 58 | Temp 98.0°F | Ht 68.0 in | Wt 206.0 lb

## 2011-02-21 DIAGNOSIS — F411 Generalized anxiety disorder: Secondary | ICD-10-CM

## 2011-02-21 DIAGNOSIS — F0280 Dementia in other diseases classified elsewhere without behavioral disturbance: Secondary | ICD-10-CM

## 2011-02-21 DIAGNOSIS — I10 Essential (primary) hypertension: Secondary | ICD-10-CM

## 2011-02-21 DIAGNOSIS — Z23 Encounter for immunization: Secondary | ICD-10-CM

## 2011-02-21 LAB — CBC WITH DIFFERENTIAL/PLATELET
Basophils Absolute: 0 10*3/uL (ref 0.0–0.1)
Eosinophils Relative: 3.3 % (ref 0.0–5.0)
HCT: 44.5 % (ref 39.0–52.0)
Lymphs Abs: 1.1 10*3/uL (ref 0.7–4.0)
MCV: 100.1 fl — ABNORMAL HIGH (ref 78.0–100.0)
Monocytes Absolute: 0.4 10*3/uL (ref 0.1–1.0)
Neutrophils Relative %: 67.8 % (ref 43.0–77.0)
Platelets: 207 10*3/uL (ref 150.0–400.0)
RDW: 12.9 % (ref 11.5–14.6)
WBC: 5.1 10*3/uL (ref 4.5–10.5)

## 2011-02-21 NOTE — Progress Notes (Signed)
Addended by: Sueanne Margarita on: 02/21/2011 11:18 AM   Modules accepted: Orders

## 2011-02-21 NOTE — Assessment & Plan Note (Signed)
BP Readings from Last 3 Encounters:  02/21/11 158/73  09/01/10 114/60  04/25/10 140/60   Up some today Will continue meds Due for labs

## 2011-02-21 NOTE — Assessment & Plan Note (Signed)
Still mild Still goes to adult day care once a week No elopement issues No changes needed

## 2011-02-21 NOTE — Assessment & Plan Note (Signed)
Still gets worked up but not severe Will continue citalopram and prn xanax

## 2011-02-21 NOTE — Progress Notes (Signed)
Subjective:    Patient ID: [name] [name], male    DOB: [birth year], 77 y.o.   MRN: [id number]  HPI Here with wife as usual Doing well  Still goes to [facility] once a week Working out fine Wife notes decline in cognitive abilities Independent with ADLs but doesn't do any of instrumental ADLs  Generally just sits around all day Does get worked up at times---needs the alprazolam at home and the [facility] at times No physical aggression--just gets angry No elopement issues  No chest pain No SOB No edema  Did have recent fall--~1 month ago Wife had to call his son to help get him up No clear reason for the fall  Current Outpatient Prescriptions on File Prior to Visit  Medication Sig Dispense Refill  . ALPRAZolam (XANAX) 0.25 MG tablet take 1 tablet by mouth three times a day if needed  90 tablet  0  . citalopram (CELEXA) 20 MG tablet take 1 tablet by mouth daily for anxiety  30 tablet  11  . felodipine (PLENDIL) 5 MG 24 hr tablet Take 5 mg by mouth daily.        . Multiple Vitamins-Minerals (CENTRUM) tablet Take 1 tablet by mouth daily.          No Known Allergies  Past Medical History  Diagnosis Date  . GERD (gastroesophageal reflux disease)   . Hypertension   . Anxiety   . Alzheimer disease     Past Surgical History  Procedure Date  . Appendectomy 1980  . Eye surgery 2005    laser eye  . Back surgery 1982, 1992  . Shoulder surgery 2004    right  . Cataract extraction, bilateral 2004    Family History  Problem Relation Age of Onset  . Asthma Mother   . Alzheimer's disease Mother   . Kidney disease Father   . COPD Brother   . Alzheimer's disease Brother   . Arthritis Brother     History   Social History  . Marital Status: Married    Spouse Name: N/A    Number of Children: 2  . Years of Education: N/A   Occupational History  . grading work    Social History Main Topics  . Smoking status: Former Smoker -- 50 years    Types: Cigarettes  . Smokeless  tobacco: Never Used  . Alcohol Use: No  . Drug Use: Not on file  . Sexually Active: Not on file   Other Topics Concern  . Not on file   Social History Narrative   No living will, would accept resuscitation ( discussion 11/06)   Review of Systems Great appetite  Talks all the time Weight is stable Sleeps well    Objective:   Physical Exam  Constitutional: He appears well-developed and well-nourished. No distress.  Neck: Normal range of motion. Neck supple. No thyromegaly present.  Cardiovascular: Normal rate, regular rhythm, normal heart sounds and intact distal pulses.  Exam reveals no gallop.   No murmur heard. Pulmonary/Chest: Effort normal and breath sounds normal. No respiratory distress. He has no wheezes. He has no rales.  Musculoskeletal: Normal range of motion. He exhibits no edema and no tenderness.  Lymphadenopathy:    He has no cervical adenopathy.  Neurological:       Knows himself and "doctor's office near [place]" No dates President-- "Obama, Ford"  Psychiatric: He has a normal mood and affect. His behavior is normal.  Assessment & Plan:

## 2011-02-22 LAB — BASIC METABOLIC PANEL
BUN: 10 mg/dL (ref 6–23)
Chloride: 108 mEq/L (ref 96–112)
Creatinine, Ser: 0.8 mg/dL (ref 0.4–1.5)
GFR: 96.88 mL/min (ref >60.00)
Glucose, Bld: 110 mg/dL — ABNORMAL HIGH (ref 70–99)
Potassium: 4.7 mEq/L (ref 3.5–5.1)

## 2011-02-22 LAB — TSH: TSH: 1.73 u[IU]/mL (ref 0.35–5.50)

## 2011-02-22 LAB — HEPATIC FUNCTION PANEL
Albumin: 4.8 g/dL (ref 3.5–5.2)
Total Bilirubin: 0.8 mg/dL (ref 0.3–1.2)

## 2011-03-09 LAB — CBC
HCT: 40
Hemoglobin: 13.9
MCHC: 34.7
MCV: 98.6
Platelets: 225
Platelets: 226
RBC: 4 — ABNORMAL LOW
RDW: 13.1
RDW: 13.3

## 2011-03-09 LAB — BASIC METABOLIC PANEL
BUN: 8
CO2: 26
Chloride: 102
Chloride: 110
Creatinine, Ser: 0.79
GFR calc Af Amer: >60
Glucose, Bld: 101 — ABNORMAL HIGH
Potassium: 4
Potassium: 4.4
Sodium: 139
Sodium: 143

## 2011-03-09 LAB — HEMOGLOBIN AND HEMATOCRIT, BLOOD: Hemoglobin: 15.6

## 2011-03-09 LAB — CREATININE, SERUM
Creatinine, Ser: 0.82
GFR calc Af Amer: >60

## 2011-03-16 ENCOUNTER — Other Ambulatory Visit: Payer: Self-pay | Admitting: Internal Medicine

## 2011-03-16 NOTE — Telephone Encounter (Signed)
Dr.Letvak patient, ok to refill? 

## 2011-03-16 NOTE — Telephone Encounter (Signed)
rx called into pharmacy

## 2011-03-22 ENCOUNTER — Encounter: Payer: Self-pay | Admitting: Internal Medicine

## 2011-03-22 ENCOUNTER — Ambulatory Visit (INDEPENDENT_AMBULATORY_CARE_PROVIDER_SITE_OTHER): Payer: Medicare Other | Admitting: Internal Medicine

## 2011-03-22 VITALS — BP 149/63 | HR 88 | Temp 98.3°F | Ht 68.0 in | Wt 202.0 lb

## 2011-03-22 DIAGNOSIS — J209 Acute bronchitis, unspecified: Secondary | ICD-10-CM | POA: Insufficient documentation

## 2011-03-22 MED ORDER — DOXYCYCLINE HYCLATE 100 MG PO CAPS: 100.0000 mg | ORAL_CAPSULE | Freq: Two times a day (BID) | ORAL | Status: AC

## 2011-03-22 NOTE — Assessment & Plan Note (Signed)
Going on for 2 weeks Clearly has a lot of sputum now Likely secondary bacterial infection Will treat with doxy

## 2011-03-22 NOTE — Progress Notes (Signed)
  Subjective:    Patient ID: [name] [name], male    DOB: 08-Jul-1926, 77 y.o.   MRN: [id number]  HPI Has had bad cough for 2 weeks Wife is concerned Productive but he doesn't show wife  No fever No SOB No shakes or sweats  No sore throat No ear pain ?slight head congestion---does blow his nose some  More agitation at times Wife needs to increase xanax to tid some days  Current Outpatient Prescriptions on File Prior to Visit  Medication Sig Dispense Refill  . ALPRAZolam (XANAX) 0.25 MG tablet Take 1 tablet (0.25 mg total) by mouth 3 (three) times daily as needed.  90 tablet  0  . citalopram (CELEXA) 20 MG tablet take 1 tablet by mouth daily for anxiety  30 tablet  11  . felodipine (PLENDIL) 5 MG 24 hr tablet Take 5 mg by mouth daily.        . Multiple Vitamins-Minerals (CENTRUM) tablet Take 1 tablet by mouth daily.          No Known Allergies  Past Medical History  Diagnosis Date  . GERD (gastroesophageal reflux disease)   . Hypertension   . Anxiety   . Alzheimer disease     Past Surgical History  Procedure Date  . Appendectomy 1980  . Eye surgery 2005    laser eye  . Back surgery 1982, 1992  . Shoulder surgery 2004    right  . Cataract extraction, bilateral 2004    Family History  Problem Relation Age of Onset  . Asthma Mother   . Alzheimer's disease Mother   . Kidney disease Father   . COPD Brother   . Alzheimer's disease Brother   . Arthritis Brother     History   Social History  . Marital Status: Married    Spouse Name: N/A    Number of Children: 2  . Years of Education: N/A   Occupational History  . grading work    Social History Main Topics  . Smoking status: Former Smoker -- 50 years    Types: Cigarettes  . Smokeless tobacco: Never Used  . Alcohol Use: No  . Drug Use: Not on file  . Sexually Active: Not on file   Other Topics Concern  . Not on file   Social History Narrative   No living will, would accept resuscitation (  discussion 11/06)   Review of Systems Increasing problems shaving--cuts himself Having occ spells of diarrhea---will be incontinent    Objective:   Physical Exam  Constitutional: He appears well-developed and well-nourished. No distress.       freq coarse cough  HENT:  Head: Normocephalic.  Nose: Nose normal.  Mouth/Throat: Oropharynx is clear and moist. No oropharyngeal exudate.       Cerumen in right canal Left TM normal  Neck: Normal range of motion.  Pulmonary/Chest: Effort normal. No respiratory distress. He has no wheezes. He has no rales.       Scattered rhonchi---clears with cough  Lymphadenopathy:    He has no cervical adenopathy.          Assessment & Plan:

## 2011-04-04 ENCOUNTER — Other Ambulatory Visit: Payer: Self-pay | Admitting: Internal Medicine

## 2011-05-01 ENCOUNTER — Other Ambulatory Visit: Payer: Self-pay | Admitting: Family Medicine

## 2011-05-01 NOTE — Telephone Encounter (Signed)
Okay #90 x 1 

## 2011-05-01 NOTE — Telephone Encounter (Signed)
rx called into pharmacy

## 2011-07-03 ENCOUNTER — Encounter: Payer: Self-pay | Admitting: Internal Medicine

## 2011-07-03 ENCOUNTER — Ambulatory Visit (INDEPENDENT_AMBULATORY_CARE_PROVIDER_SITE_OTHER): Payer: Medicare Other | Admitting: Internal Medicine

## 2011-07-03 VITALS — BP 120/60 | HR 58 | Temp 98.4°F | Resp 18 | Ht 68.0 in | Wt 195.0 lb

## 2011-07-03 DIAGNOSIS — I10 Essential (primary) hypertension: Secondary | ICD-10-CM

## 2011-07-03 DIAGNOSIS — Z111 Encounter for screening for respiratory tuberculosis: Secondary | ICD-10-CM | POA: Diagnosis not present

## 2011-07-03 DIAGNOSIS — F0280 Dementia in other diseases classified elsewhere without behavioral disturbance: Secondary | ICD-10-CM | POA: Diagnosis not present

## 2011-07-03 DIAGNOSIS — F411 Generalized anxiety disorder: Secondary | ICD-10-CM | POA: Diagnosis not present

## 2011-07-03 DIAGNOSIS — G309 Alzheimer's disease, unspecified: Secondary | ICD-10-CM | POA: Diagnosis not present

## 2011-07-03 NOTE — Assessment & Plan Note (Addendum)
Perhaps slow progression Mood controlled Delusions which are harmless at this point  [facility] paperwork done PPD today

## 2011-07-03 NOTE — Progress Notes (Signed)
  Subjective:    Patient ID: [name] [name], male    DOB: [birth year], 78 y.o.   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 ALPRAZolam (XANAX) 0.25 MG tablet Take 1 tablet (0.25 mg total) by mouth 3 (three) times daily as needed for sleep.  90 tablet  1  . citalopram (CELEXA) 20 MG tablet take 1 tablet by mouth daily for anxiety  30 tablet  11  . felodipine (PLENDIL) 5 MG 24 hr tablet take 1 tablet by mouth once daily  30 tablet  12  . Multiple Vitamins-Minerals (CENTRUM) tablet Take 1 tablet by mouth daily.          No Known Allergies  Past Medical History  Diagnosis Date  . GERD (gastroesophageal reflux disease)   . Hypertension   . Anxiety   . Alzheimer disease     Past Surgical History  Procedure Date  . Appendectomy 1980  . Eye surgery 2005    laser eye  . Back surgery 1982, 1992  . Shoulder surgery 2004    right  . Cataract extraction, bilateral 2004    Family History  Problem Relation Age of Onset  . Asthma Mother   . Alzheimer's disease Mother   . Kidney disease Father   . COPD Brother   . Alzheimer's disease Brother   . Arthritis Brother     History   Social History  . Marital Status: Married    Spouse Name: N/A    Number of Children: 2  . Years of Education: N/A   Occupational History  . grading work    Social History Main Topics  . Smoking status: Former Smoker -- 50 years    Types: Cigarettes  . Smokeless tobacco: Never  Used  . Alcohol Use: No  . Drug Use: Not on file  . Sexually Active: Not on file   Other Topics Concern  . Not on file   Social History Narrative   No living will, would accept resuscitation ( discussion 11/06)   Review of Systems Appetite is fine Weight is down a few pounds Sleeps well    Objective:   Physical Exam  Constitutional: He appears well-developed and well-nourished. No distress.  Neck: Normal range of motion. Neck supple.  Cardiovascular: Normal rate, regular rhythm and normal heart sounds.  Exam reveals no gallop.   No murmur heard. Pulmonary/Chest: Effort normal and breath sounds normal. No respiratory distress. He has no wheezes. He has no rales.  Abdominal: Soft. There is no tenderness.  Musculoskeletal: He exhibits no edema and no tenderness.  Lymphadenopathy:    He has no cervical adenopathy.  Neurological:       Repeats himself Talking about his grading customers  Psychiatric:       Good spirits and sense of humor          Assessment & Plan:

## 2011-07-03 NOTE — Patient Instructions (Signed)
Please cancel March visit

## 2011-07-03 NOTE — Progress Notes (Signed)
Addended by: Sueanne Margarita on: 07/03/2011 03:09 PM   Modules accepted: Orders

## 2011-07-03 NOTE — Assessment & Plan Note (Signed)
BP Readings from Last 3 Encounters:  07/03/11 120/60  03/22/11 149/63  02/21/11 158/73   Better today Lab Results  Component Value Date   CREATININE 0.8 02/21/2011   No changes needed

## 2011-07-03 NOTE — Assessment & Plan Note (Signed)
Controlled on citalopram and bid xanax

## 2011-07-05 ENCOUNTER — Ambulatory Visit: Payer: Medicare Other | Admitting: Internal Medicine

## 2011-07-15 ENCOUNTER — Inpatient Hospital Stay (HOSPITAL_COMMUNITY)
Admission: EM | Admit: 2011-07-15 | Discharge: 2011-07-19 | DRG: 690 | Disposition: A | Discharge status: Skilled Nursing Facility | Payer: Medicare Other | Attending: Internal Medicine | Admitting: Internal Medicine

## 2011-07-15 ENCOUNTER — Encounter (HOSPITAL_COMMUNITY): Payer: Self-pay

## 2011-07-15 ENCOUNTER — Emergency Department (HOSPITAL_COMMUNITY): Payer: Medicare Other

## 2011-07-15 ENCOUNTER — Other Ambulatory Visit: Payer: Self-pay

## 2011-07-15 DIAGNOSIS — I872 Venous insufficiency (chronic) (peripheral): Secondary | ICD-10-CM | POA: Diagnosis present

## 2011-07-15 DIAGNOSIS — I10 Essential (primary) hypertension: Secondary | ICD-10-CM | POA: Diagnosis not present

## 2011-07-15 DIAGNOSIS — Z87891 Personal history of nicotine dependence: Secondary | ICD-10-CM

## 2011-07-15 DIAGNOSIS — N390 Urinary tract infection, site not specified: Principal | ICD-10-CM | POA: Diagnosis present

## 2011-07-15 DIAGNOSIS — R5381 Other malaise: Secondary | ICD-10-CM | POA: Diagnosis present

## 2011-07-15 DIAGNOSIS — R339 Retention of urine, unspecified: Secondary | ICD-10-CM | POA: Diagnosis not present

## 2011-07-15 DIAGNOSIS — G309 Alzheimer's disease, unspecified: Secondary | ICD-10-CM | POA: Diagnosis not present

## 2011-07-15 DIAGNOSIS — M129 Arthropathy, unspecified: Secondary | ICD-10-CM

## 2011-07-15 DIAGNOSIS — K219 Gastro-esophageal reflux disease without esophagitis: Secondary | ICD-10-CM | POA: Diagnosis present

## 2011-07-15 DIAGNOSIS — M19079 Primary osteoarthritis, unspecified ankle and foot: Secondary | ICD-10-CM | POA: Diagnosis present

## 2011-07-15 DIAGNOSIS — R404 Transient alteration of awareness: Secondary | ICD-10-CM | POA: Diagnosis not present

## 2011-07-15 DIAGNOSIS — R5383 Other fatigue: Secondary | ICD-10-CM | POA: Diagnosis not present

## 2011-07-15 DIAGNOSIS — F411 Generalized anxiety disorder: Secondary | ICD-10-CM

## 2011-07-15 DIAGNOSIS — A498 Other bacterial infections of unspecified site: Secondary | ICD-10-CM | POA: Diagnosis present

## 2011-07-15 DIAGNOSIS — K5900 Constipation, unspecified: Secondary | ICD-10-CM | POA: Diagnosis present

## 2011-07-15 DIAGNOSIS — M6281 Muscle weakness (generalized): Secondary | ICD-10-CM | POA: Diagnosis not present

## 2011-07-15 DIAGNOSIS — F39 Unspecified mood [affective] disorder: Secondary | ICD-10-CM | POA: Diagnosis present

## 2011-07-15 DIAGNOSIS — F341 Dysthymic disorder: Secondary | ICD-10-CM | POA: Diagnosis present

## 2011-07-15 DIAGNOSIS — R262 Difficulty in walking, not elsewhere classified: Secondary | ICD-10-CM | POA: Diagnosis not present

## 2011-07-15 DIAGNOSIS — R918 Other nonspecific abnormal finding of lung field: Secondary | ICD-10-CM | POA: Diagnosis not present

## 2011-07-15 DIAGNOSIS — J984 Other disorders of lung: Secondary | ICD-10-CM | POA: Diagnosis not present

## 2011-07-15 DIAGNOSIS — F0280 Dementia in other diseases classified elsewhere without behavioral disturbance: Secondary | ICD-10-CM | POA: Diagnosis present

## 2011-07-15 DIAGNOSIS — R279 Unspecified lack of coordination: Secondary | ICD-10-CM | POA: Diagnosis not present

## 2011-07-15 DIAGNOSIS — E876 Hypokalemia: Secondary | ICD-10-CM | POA: Diagnosis not present

## 2011-07-15 DIAGNOSIS — J9819 Other pulmonary collapse: Secondary | ICD-10-CM | POA: Diagnosis not present

## 2011-07-15 DIAGNOSIS — E782 Mixed hyperlipidemia: Secondary | ICD-10-CM | POA: Diagnosis not present

## 2011-07-15 DIAGNOSIS — N281 Cyst of kidney, acquired: Secondary | ICD-10-CM | POA: Diagnosis not present

## 2011-07-15 DIAGNOSIS — E538 Deficiency of other specified B group vitamins: Secondary | ICD-10-CM

## 2011-07-15 DIAGNOSIS — R531 Weakness: Secondary | ICD-10-CM

## 2011-07-15 DIAGNOSIS — Z8719 Personal history of other diseases of the digestive system: Secondary | ICD-10-CM

## 2011-07-15 HISTORY — DX: Alzheimer's disease, unspecified: G30.9

## 2011-07-15 HISTORY — DX: Deficiency of other specified B group vitamins: E53.8

## 2011-07-15 HISTORY — DX: Essential (primary) hypertension: I10

## 2011-07-15 HISTORY — DX: Venous insufficiency (chronic) (peripheral): I87.2

## 2011-07-15 HISTORY — DX: Dementia in other diseases classified elsewhere without behavioral disturbance: F02.80

## 2011-07-15 HISTORY — DX: Personal history of other diseases of the digestive system: Z87.19

## 2011-07-15 HISTORY — DX: Arthropathy, unspecified: M12.9

## 2011-07-15 LAB — URINALYSIS, ROUTINE W REFLEX MICROSCOPIC
Glucose, UA: NEGATIVE mg/dL
Ketones, ur: 15 mg/dL — AB
Protein, ur: NEGATIVE mg/dL
pH: 5.5 (ref 5.0–8.0)

## 2011-07-15 LAB — BASIC METABOLIC PANEL
CO2: 24 mEq/L (ref 19–32)
Calcium: 9.2 mg/dL (ref 8.4–10.5)
Creatinine, Ser: 0.71 mg/dL (ref 0.50–1.35)
GFR calc Af Amer: >90 mL/min (ref >90)
GFR calc non Af Amer: 85 mL/min — ABNORMAL LOW (ref >90)
Sodium: 140 mEq/L (ref 135–145)

## 2011-07-15 LAB — DIFFERENTIAL
Eosinophils Absolute: 0 10*3/uL (ref 0.0–0.7)
Eosinophils Relative: 0 % (ref 0–5)
Lymphocytes Relative: 2 % — ABNORMAL LOW (ref 12–46)
Lymphs Abs: 0.2 10*3/uL — ABNORMAL LOW (ref 0.7–4.0)
Monocytes Absolute: 0.3 10*3/uL (ref 0.1–1.0)

## 2011-07-15 LAB — URINE MICROSCOPIC-ADD ON

## 2011-07-15 LAB — CBC
HCT: 37 % — ABNORMAL LOW (ref 39.0–52.0)
MCH: 33.5 pg (ref 26.0–34.0)
MCV: 96.1 fL (ref 78.0–100.0)
Platelets: 148 10*3/uL — ABNORMAL LOW (ref 150–400)
RBC: 3.85 MIL/uL — ABNORMAL LOW (ref 4.22–5.81)
RDW: 13.2 % (ref 11.5–15.5)

## 2011-07-15 MED ORDER — SODIUM CHLORIDE 0.9 % IV BOLUS (SEPSIS)
500.0000 mL | Freq: Once | INTRAVENOUS | Status: AC
  Administered 2011-07-15: 1000 mL via INTRAVENOUS

## 2011-07-15 MED ORDER — POTASSIUM CHLORIDE CRYS ER 20 MEQ PO TBCR
20.0000 meq | EXTENDED_RELEASE_TABLET | Freq: Once | ORAL | Status: AC
  Administered 2011-07-15: 20 meq via ORAL
  Filled 2011-07-15: qty 1

## 2011-07-15 MED ORDER — POTASSIUM CHLORIDE CRYS ER 20 MEQ PO TBCR: 20.0000 meq | EXTENDED_RELEASE_TABLET | Freq: Once | ORAL | Status: DC

## 2011-07-15 MED ORDER — SODIUM CHLORIDE 0.9 % IV SOLN: INTRAVENOUS | Status: AC

## 2011-07-15 MED ORDER — CIPROFLOXACIN IN D5W 400 MG/200ML IV SOLN
400.0000 mg | Freq: Once | INTRAVENOUS | Status: AC
  Administered 2011-07-15: 400 mg via INTRAVENOUS
  Filled 2011-07-15: qty 200

## 2011-07-15 MED ORDER — ONDANSETRON HCL 4 MG/2ML IJ SOLN: 4.0000 mg | Freq: Three times a day (TID) | INTRAMUSCULAR | Status: DC | PRN

## 2011-07-15 NOTE — ED Provider Notes (Signed)
History     CSN: [id number]  Arrival date & time 07/15/11  1825   First MD Initiated Contact with Patient 07/15/11 1830      Chief Complaint  Patient presents with  . Weakness    (Consider location/radiation/quality/duration/timing/severity/associated sxs/prior treatment) HPI Comments: Patient's wife states that over the last several weeks he has had very foul-smelling urine with frequency.  Patient is a 78 y.o. male presenting with weakness. The history is provided by the patient.  Weakness The primary symptoms include fever. Primary symptoms do not include headaches, syncope, loss of consciousness, focal weakness, nausea or vomiting. Episode onset: Today. The episode lasted 1 day. The symptoms are unchanged. The neurological symptoms are diffuse. Context: Patient started to develop chills today and generalized weakness. Normally he walks without any problem was having a difficult time getting out of his chair due to feeling so weak.  Additional symptoms include weakness. Medical issues do not include seizures, cerebral vascular accident, alcohol use, drug use or diabetes.    Past Medical History  Diagnosis Date  . GERD (gastroesophageal reflux disease)   . Hypertension   . Anxiety   . Alzheimer disease   . DIVERTICULOSIS, COLON, HX OF 09/19/2006  . ARTHRITIS 09/19/2006  . VENOUS INSUFFICIENCY 09/19/2006  . HYPERTENSION 09/19/2006  . ALZHEIMERS DISEASE 09/25/2008  . B12 DEFICIENCY 11/23/2008    Past Surgical History  Procedure Date  . Appendectomy 1980  . Eye surgery 2005    laser eye  . Back surgery 1982, 1992  . Shoulder surgery 2004    right  . Cataract extraction, bilateral 2004    Family History  Problem Relation Age of Onset  . Asthma Mother   . Alzheimer's disease Mother   . Kidney disease Father   . COPD Brother   . Alzheimer's disease Brother   . Arthritis Brother     History  Substance Use Topics  . Smoking status: Former Smoker -- 50 years    Types:  Cigarettes  . Smokeless tobacco: Never Used  . Alcohol Use: No      Review of Systems  Constitutional: Positive for fever.  Cardiovascular: Negative for syncope.  Gastrointestinal: Negative for nausea and vomiting.  Neurological: Positive for weakness. Negative for focal weakness, loss of consciousness and headaches.  All other systems reviewed and are negative.    Allergies  Review of patient's allergies indicates no known allergies.  Home Medications   Current Outpatient Rx  Name Route Sig Dispense Refill  . ACETAMINOPHEN 500 MG PO TABS Oral Take 500 mg by mouth every 6 (six) hours as needed. For pain    . ALPRAZOLAM 0.25 MG PO TABS Oral Take 1 tablet (0.25 mg total) by mouth 3 (three) times daily as needed for sleep. 90 tablet 1  . CITALOPRAM HYDROBROMIDE 20 MG PO TABS  take 1 tablet by mouth daily for anxiety 30 tablet 11  . FELODIPINE ER 5 MG PO TB24  take 1 tablet by mouth once daily 30 tablet 12  . CENTRUM PO TABS Oral Take 1 tablet by mouth daily.        BP 114/61  Pulse 74  Temp(Src) 98.9 F (37.2 C) (Oral)  Resp 20  SpO2 95%  Physical Exam  Nursing note and vitals reviewed. Constitutional: He is oriented to person, place, and time. He appears well-developed and well-nourished. No distress.  HENT:  Head: Normocephalic and atraumatic.  Mouth/Throat: Oropharynx is clear and moist.  Eyes: Conjunctivae and EOM are normal. Pupils  are equal, round, and reactive to light.  Neck: Normal range of motion. Neck supple.  Cardiovascular: Normal rate, regular rhythm and intact distal pulses.   No murmur heard. Pulmonary/Chest: Effort normal and breath sounds normal. No respiratory distress. He has no wheezes. He has no rales.  Abdominal: Soft. He exhibits no distension. There is no tenderness. There is no rebound and no guarding.  Musculoskeletal: Normal range of motion. He exhibits no edema and no tenderness.  Neurological: He is alert and oriented to person, place, and  time. He has normal strength. No cranial nerve deficit or sensory deficit. Coordination normal.  Skin: Skin is warm and dry. No rash noted. No erythema.  Psychiatric: He has a normal mood and affect. His behavior is normal.    ED Course  Procedures (including critical care time)  Labs Reviewed  CBC - Abnormal; Notable for the following:    RBC 3.85 (*)    Hemoglobin 12.9 (*)    HCT 37.0 (*)    Platelets 148 (*)    All other components within normal limits  DIFFERENTIAL - Abnormal; Notable for the following:    Neutrophils Relative 94 (*)    Neutro Abs 7.8 (*)    Lymphocytes Relative 2 (*)    Lymphs Abs 0.2 (*)    All other components within normal limits  URINALYSIS, ROUTINE W REFLEX MICROSCOPIC - Abnormal; Notable for the following:    Color, Urine AMBER (*) BIOCHEMICALS MAY BE AFFECTED BY COLOR   APPearance CLOUDY (*)    Bilirubin Urine SMALL (*)    Ketones, ur 15 (*)    Nitrite POSITIVE (*)    Leukocytes, UA MODERATE (*)    All other components within normal limits  BASIC METABOLIC PANEL - Abnormal; Notable for the following:    Potassium 3.1 (*)    Glucose, Bld 111 (*)    GFR calc non Af Amer 85 (*)    All other components within normal limits  URINE MICROSCOPIC-ADD ON - Abnormal; Notable for the following:    Bacteria, UA MANY (*)    All other components within normal limits  URINE CULTURE   Dg Chest 2 View  07/15/2011  *RADIOLOGY REPORT*  Clinical Data: Confusion.  Weakness.  CHEST - 2 VIEW  Comparison: Two-view chest 11/20/2007.  Findings: The heart size is normal.  Mild emphysematous changes are again noted.  The previously seen hiatal hernia is no longer present.  There is mild dilation of the visualized bowel.  Minimal bibasilar atelectasis is present.  IMPRESSION:  1.  Minimal bibasilar airspace disease likely reflects atelectasis. 2.  The hiatal hernia is no longer visible. 3.  Dilated loops of bowel are present just under the diaphragm.  Original Report  Authenticated By: Jamesetta Orleans. MATTERN, M.D.     1. UTI (lower urinary tract infection)   2. Weakness      Date: 07/15/2011  Rate: 72  Rhythm: normal sinus rhythm  QRS Axis: normal  Intervals: normal  ST/T Wave abnormalities: normal  Conduction Disutrbances:none  Narrative Interpretation:   Old EKG Reviewed: unchanged    MDM   Patient with generalized weakness due to UTI. Normal labs other than a UA consistent with UTI. Given he is having difficulty getting out of bed and walking 2 to diffuse weakness will admit for observation. Patient placed on Cipro and a urine culture sent.        Gwyneth Sprout, MD 07/16/11 [phone]

## 2011-07-15 NOTE — ED Notes (Signed)
Admitting MD at bedside.

## 2011-07-15 NOTE — H&P (Addendum)
[name] [name] [phone]  Outpatient Primary MD for the patient is Tillman Abide, MD, MD  With History of -  Past Medical History  Diagnosis Date  . GERD (gastroesophageal reflux disease)   . Hypertension   . Anxiety   . Alzheimer disease   . DIVERTICULOSIS, COLON, HX OF 09/19/2006  . ARTHRITIS 09/19/2006  . VENOUS INSUFFICIENCY 09/19/2006  . HYPERTENSION 09/19/2006  . ALZHEIMERS DISEASE 09/25/2008  . B12 DEFICIENCY 11/23/2008      Past Surgical History  Procedure Date  . Appendectomy 1980  . Eye surgery 2005    laser eye  . Back surgery 1982, 1992  . Shoulder surgery 2004    right  . Cataract extraction, bilateral 2004    in for   Chief Complaint  Patient presents with  . Weakness     HPI  [name] [name]  is a 78 y.o. male, with history of  Dementia, but otherwise in relatively good health, who has been having some dysuria with foul-smelling urine low-grade fevers generalized weakness for the last 24 hour, comes to the hospital when his weakness got worse and he was unable to stand up without help, in the ER UA suggestive of UTI and I was called to admit the patient. Patient relatively symptom-free except as dictated above.    Review of Systems    In addition to the HPI above,   No Fever-chills, No Headache, No changes with Vision or hearing, No problems swallowing food or Liquids, No Chest pain, Cough or Shortness of Breath, No Abdominal pain, No Nausea or Vommitting, Bowel movements are regular, No Blood in stool or Urine, + dysuria, No new skin rashes or bruises, No new joints pains-aches,  No new weakness focally, no tingling, numbness in any extremity, No recent weight gain or loss, No polyuria, polydypsia or polyphagia, No significant Mental Stressors.  A full 10 point Review of Systems was done, except as stated above, all other Review of Systems were negative.   Social History History  Substance Use Topics  . Smoking status:  Former Smoker -- 50 years    Types: Cigarettes  . Smokeless tobacco: Never Used  . Alcohol Use: No      Family History Family History  Problem Relation Age of Onset  . Asthma Mother   . Alzheimer's disease Mother   . Kidney disease Father   . COPD Brother   . Alzheimer's disease Brother   . Arthritis Brother       Prior to Admission medications   Medication Sig Start Date End Date Taking? Authorizing Provider  acetaminophen (TYLENOL) 500 MG tablet Take 500 mg by mouth every 6 (six) hours as needed. For pain   Yes Historical Provider, MD  ALPRAZolam Prudy Feeler) 0.25 MG tablet Take 1 tablet (0.25 mg total) by mouth 3 (three) times daily as needed for sleep. 05/01/11  Yes Varney Baas, MD  citalopram (CELEXA) 20 MG tablet take 1 tablet by mouth daily for anxiety 11/14/10  Yes Varney Baas, MD  felodipine (PLENDIL) 5 MG 24 hr tablet take 1 tablet by mouth once daily 04/04/11  Yes Varney Baas, MD  Multiple Vitamins-Minerals (CENTRUM) tablet Take 1 tablet by mouth daily.     Yes Historical Provider, MD    No Known Allergies  Physical Exam  Vitals  Blood pressure 114/61, pulse 74, temperature 98.9 F (37.2 C), temperature source Oral, resp. rate 20, SpO2 95.00%.   1. General elderly Caucasian male lying in bed in NAD,  2. Normal affect and insight, Not Suicidal or Homicidal, Awake but pleasantly confused, oriented to person only  3. No F.N deficits, ALL C.Nerves Intact, Strength 5/5 all 4 extremities, Sensation intact all 4 extremities, Plantars down going.  4. Ears and Eyes appear Normal, Conjunctivae clear, PERRLA. Moist Oral Mucosa.  5. Supple Neck, No JVD, No cervical lymphadenopathy appriciated, No Carotid Bruits.  6. Symmetrical Chest wall movement, Good air movement bilaterally, CTAB.  7. RRR, No Gallops, Rubs or Murmurs, No Parasternal Heave.  8. Positive Bowel Sounds, Abdomen Soft, Non tender, No organomegaly appriciated,       No rebound -guarding or  rigidity.  9.  No Cyanosis, Normal Skin Turgor, No Skin Rash or Bruise.  10. Good muscle tone,  joints appear normal , no effusions, Normal ROM.  11. No Palpable Lymph Nodes in Neck or Axillae     Data Review  CBC  Lab 07/15/11 1844  WBC 8.3  HGB 12.9*  HCT 37.0*  PLT 148*  MCV 96.1  MCH 33.5  MCHC 34.9  RDW 13.2  LYMPHSABS 0.2*  MONOABS 0.3  EOSABS 0.0  BASOSABS 0.0  BANDABS --   ------------------------------------------------------------------------------------------------------------------ Chemistries   Lab 07/15/11 1844  NA 140  K 3.1*  CL 107  CO2 24  GLUCOSE 111*  BUN 13  CREATININE 0.71  CALCIUM 9.2  MG --  AST --  ALT --  ALKPHOS --  BILITOT --   ------------------------------------------------------------------------------------------------------------------ CrCl is unknown because both a height and weight (above a minimum accepted value) are required for this calculation. ------------------------------------------------------------------------------------------------------------------ Results for ALDOUS, HOUSEL (MRN [id number]) as of 07/15/2011 20:50  Ref. Range 07/15/2011 19:17  Color, Urine Latest Range: YELLOW  AMBER (A)  APPearance Latest Range: CLEAR  CLOUDY (A)  Specific Gravity, Urine Latest Range: 1.005-1.030  1.024  pH Latest Range: 5.0-8.0  5.5  Glucose, UA Latest Range: NEGATIVE mg/dL NEGATIVE  Bilirubin Urine Latest Range: NEGATIVE  SMALL (A)  Ketones, ur Latest Range: NEGATIVE mg/dL 15 (A)  Protein Latest Range: NEGATIVE mg/dL NEGATIVE  Urobilinogen, UA Latest Range: 0.0-1.0 mg/dL 1.0  Nitrite Latest Range: NEGATIVE  POSITIVE (A)  Leukocytes, UA Latest Range: NEGATIVE  MODERATE (A)  Urine-Other No range found MUCOUS PRESENT  WBC, UA Latest Range: <3 WBC/hpf 11-20  RBC / HPF Latest Range: <3 RBC/hpf 0-2  Squamous Epithelial / LPF Latest Range: RARE  RARE  Bacteria, UA Latest Range: RARE  MANY (A)     Imaging results:   Dg  Chest 2 View  07/15/2011  *RADIOLOGY REPORT*  Clinical Data: Confusion.  Weakness.  CHEST - 2 VIEW  Comparison: Two-view chest 11/20/2007.  Findings: The heart size is normal.  Mild emphysematous changes are again noted.  The previously seen hiatal hernia is no longer present.  There is mild dilation of the visualized bowel.  Minimal bibasilar atelectasis is present.  IMPRESSION:  1.  Minimal bibasilar airspace disease likely reflects atelectasis. 2.  The hiatal hernia is no longer visible. 3.  Dilated loops of bowel are present just under the diaphragm.  Original Report Authenticated By: Jamesetta Orleans. MATTERN, M.D.    My personal review of EKG: Rhythm NSR, Rate 72 /min, QTc411 , no Acute ST changes     Assessment & Plan  1. Dysuria with generalized weakness in the 78 year old male -secondary to UTI, plan is to admit the patient obtain urinalysis and culture, blood cultures if patient is febrile, will obtain renal postvoid ultrasound to rule out urinary retention, IV  antibiotics and gentle IV fluids.   2. generalized weakness due to #1 above-will have PT OT evaluate and treat the patient.   3. Alzheimer's dementia-no acute issues home medications to continue, may have delirium will add when necessary Haldol if needed. Will order fall precautions, bed alarm.   4. History of arthritis, diverticulosis of colon - no acute issues outpatient monitor.   5.Low K - replace and check in am.   DVT Prophylaxis Heparin    AM Labs Ordered, also please review Full Orders  Admission, patients condition and plan of care including tests being ordered have been discussed with the patient and wife who indicate understanding and agree with the plan and Code Status.  Code Status Full  Condition Marinell Blight K M.D on 07/15/2011 at 8:49 PM  [facility] Office  [phone]

## 2011-07-15 NOTE — ED Notes (Signed)
Pt transported by Darius NT.

## 2011-07-15 NOTE — ED Notes (Signed)
Patient denies pain and is resting comfortably.  

## 2011-07-15 NOTE — ED Notes (Signed)
Patient transported to X-ray 

## 2011-07-15 NOTE — ED Notes (Signed)
Water given to py. Okayed by Dr. Anitra Lauth.

## 2011-07-15 NOTE — ED Notes (Addendum)
Pt reports not feeling well and having chills starting 1300 today, per family pt slid out of chair sliding into floor, denies hitting his head, LOC, or any injury d/t fall, grandchildren assisted pt to a standing position, pt unsteady on his feet. Per family pt normally ambulatory w/no assistance, no speech deficits, no one sided weakness. Per spouse pt has productive cough, unsure of color of sputum, having episode of urinary incontinence and foul smelling urine.

## 2011-07-15 NOTE — ED Notes (Signed)
Family at bedside. 

## 2011-07-15 NOTE — ED Notes (Signed)
Made pt and family aware that pt is waiting for bed request. Will continue to monitor.

## 2011-07-15 NOTE — ED Notes (Signed)
Pt to ED via [facility], pt's family called 911 d/t  Weakness, fever, and chills x3 hrs. Pt has hx of dementia, pt able to stand for EMS w/assistance, cbg 99,

## 2011-07-15 NOTE — ED Notes (Addendum)
Received report from [employer]. Pt is complaining of increased weakness. According to pt weakness is a little better. Pt also complaining of urinary frequency. No urinary burning or itching. No back pain.  Currently pt is not in any respiratory distress. NO CP or neuro deficits. Will continue to monitor.

## 2011-07-15 NOTE — ED Notes (Signed)
MD at bedside. EDP [place] 

## 2011-07-16 ENCOUNTER — Inpatient Hospital Stay (HOSPITAL_COMMUNITY): Payer: Medicare Other

## 2011-07-16 LAB — CBC
HCT: 38.4 % — ABNORMAL LOW (ref 39.0–52.0)
Hemoglobin: 13 g/dL (ref 13.0–17.0)
WBC: 9.4 10*3/uL (ref 4.0–10.5)

## 2011-07-16 LAB — BASIC METABOLIC PANEL
Chloride: 107 mEq/L (ref 96–112)
GFR calc Af Amer: >90 mL/min (ref >90)
Potassium: 5 mEq/L (ref 3.5–5.1)

## 2011-07-16 LAB — PROTIME-INR
INR: 1.2 (ref 0.00–1.49)
Prothrombin Time: 15.5 seconds — ABNORMAL HIGH (ref 11.6–15.2)

## 2011-07-16 MED ORDER — ALBUTEROL SULFATE (5 MG/ML) 0.5% IN NEBU: 2.5000 mg | INHALATION_SOLUTION | RESPIRATORY_TRACT | Status: DC | PRN

## 2011-07-16 MED ORDER — ONDANSETRON HCL 4 MG PO TABS: 4.0000 mg | ORAL_TABLET | Freq: Four times a day (QID) | ORAL | Status: DC | PRN

## 2011-07-16 MED ORDER — CIPROFLOXACIN HCL 500 MG PO TABS
500.0000 mg | ORAL_TABLET | Freq: Two times a day (BID) | ORAL | Status: DC
  Filled 2011-07-16 (×2): qty 1

## 2011-07-16 MED ORDER — HEPARIN SODIUM (PORCINE) 5000 UNIT/ML IJ SOLN
5000.0000 [IU] | Freq: Three times a day (TID) | INTRAMUSCULAR | Status: DC
  Administered 2011-07-16 – 2011-07-18 (×7): 5000 [IU] via SUBCUTANEOUS
  Filled 2011-07-16 (×13): qty 1

## 2011-07-16 MED ORDER — CIPROFLOXACIN HCL 500 MG PO TABS
500.0000 mg | ORAL_TABLET | Freq: Two times a day (BID) | ORAL | Status: DC
  Administered 2011-07-16 – 2011-07-19 (×6): 500 mg via ORAL
  Filled 2011-07-16 (×8): qty 1

## 2011-07-16 MED ORDER — CIPROFLOXACIN IN D5W 400 MG/200ML IV SOLN
400.0000 mg | Freq: Two times a day (BID) | INTRAVENOUS | Status: DC
  Filled 2011-07-16 (×2): qty 200

## 2011-07-16 MED ORDER — HYDROCODONE-ACETAMINOPHEN 5-325 MG PO TABS
1.0000 | ORAL_TABLET | Freq: Four times a day (QID) | ORAL | Status: DC | PRN
  Administered 2011-07-16 – 2011-07-18 (×3): 1 via ORAL
  Filled 2011-07-16 (×3): qty 1

## 2011-07-16 MED ORDER — CITALOPRAM HYDROBROMIDE 20 MG PO TABS
20.0000 mg | ORAL_TABLET | Freq: Every day | ORAL | Status: DC
  Administered 2011-07-16 – 2011-07-19 (×4): 20 mg via ORAL
  Filled 2011-07-16 (×4): qty 1

## 2011-07-16 MED ORDER — ALPRAZOLAM 0.25 MG PO TABS
0.2500 mg | ORAL_TABLET | Freq: Three times a day (TID) | ORAL | Status: DC | PRN
  Administered 2011-07-17 – 2011-07-18 (×2): 0.25 mg via ORAL
  Filled 2011-07-16 (×2): qty 1

## 2011-07-16 MED ORDER — ACETAMINOPHEN 325 MG PO TABS: 650.0000 mg | ORAL_TABLET | Freq: Four times a day (QID) | ORAL | Status: DC | PRN

## 2011-07-16 MED ORDER — LORAZEPAM 1 MG PO TABS
1.0000 mg | ORAL_TABLET | Freq: Once | ORAL | Status: AC
  Administered 2011-07-16: 1 mg via ORAL
  Filled 2011-07-16: qty 1

## 2011-07-16 MED ORDER — GUAIFENESIN-DM 100-10 MG/5ML PO SYRP
5.0000 mL | ORAL_SOLUTION | ORAL | Status: DC | PRN
  Filled 2011-07-16: qty 5

## 2011-07-16 MED ORDER — LORAZEPAM 1 MG PO TABS
2.0000 mg | ORAL_TABLET | Freq: Once | ORAL | Status: AC
  Administered 2011-07-16: 2 mg via ORAL
  Filled 2011-07-16: qty 2

## 2011-07-16 MED ORDER — POTASSIUM CHLORIDE IN NACL 20-0.9 MEQ/L-% IV SOLN
INTRAVENOUS | Status: DC
  Filled 2011-07-16: qty 1000

## 2011-07-16 MED ORDER — ONDANSETRON HCL 4 MG/2ML IJ SOLN: 4.0000 mg | Freq: Four times a day (QID) | INTRAMUSCULAR | Status: DC | PRN

## 2011-07-16 MED ORDER — FELODIPINE ER 5 MG PO TB24
5.0000 mg | ORAL_TABLET | Freq: Every day | ORAL | Status: DC
  Administered 2011-07-16 – 2011-07-19 (×4): 5 mg via ORAL
  Filled 2011-07-16 (×4): qty 1

## 2011-07-16 MED ORDER — HALOPERIDOL LACTATE 5 MG/ML IJ SOLN
0.5000 mg | Freq: Four times a day (QID) | INTRAMUSCULAR | Status: DC | PRN
  Administered 2011-07-16: 0.5 mg via INTRAVENOUS
  Filled 2011-07-16: qty 1

## 2011-07-16 NOTE — Evaluation (Signed)
Physical Therapy Evaluation Patient Details Name: [name] [name] MRN: [id number] DOB: 06-21-1926 Today's Date: 07/16/2011  Problem List:  Patient Active Problem List  Diagnoses  . B12 DEFICIENCY  . ANXIETY  . ALZHEIMERS DISEASE  . HYPERTENSION  . VENOUS INSUFFICIENCY  . GERD  . CONSTIPATION  . ARTHRITIS  . DIVERTICULOSIS, COLON, HX OF  . UTI (lower urinary tract infection)    Past Medical History:  Past Medical History  Diagnosis Date  . GERD (gastroesophageal reflux disease)   . Hypertension   . Anxiety   . Alzheimer disease   . DIVERTICULOSIS, COLON, HX OF 09/19/2006  . ARTHRITIS 09/19/2006  . VENOUS INSUFFICIENCY 09/19/2006  . HYPERTENSION 09/19/2006  . ALZHEIMERS DISEASE 09/25/2008  . B12 DEFICIENCY 11/23/2008   Past Surgical History:  Past Surgical History  Procedure Date  . Appendectomy 1980  . Eye surgery 2005    laser eye  . Back surgery 1982, 1992  . Shoulder surgery 2004    right  . Cataract extraction, bilateral 2004    PT Assessment/Plan/Recommendation PT Assessment Clinical Impression Statement: Patient is an 78 yo male admitted with UTI.  Patient also demonstrating general weakness, worsening of dementia, and decreased balance all impacting functional mobility.  Patient requires mod assist for safety with gait.  Per wife, patient is significantly different from his baseline.  Recommend ST-SNF for continued therapy at discharge with goal of mod I level mobility. PT Recommendation/Assessment: Patient will need skilled PT in the acute care venue PT Problem List: Decreased strength;Decreased activity tolerance;Decreased balance;Decreased mobility;Decreased cognition;Decreased knowledge of use of DME;Decreased safety awareness PT Therapy Diagnosis : Difficulty walking;Abnormality of gait;Generalized weakness;Altered mental status PT Plan PT Frequency: Min 3X/week PT Treatment/Interventions: DME instruction;Gait training;Functional mobility training;Therapeutic  activities;Balance training;Patient/family education;Cognitive remediation PT Recommendation Follow Up Recommendations: Skilled nursing facility Equipment Recommended: Defer to next venue PT Goals  Acute Rehab PT Goals PT Goal Formulation: With patient/family Time For Goal Achievement: 2 weeks Pt will Roll Supine to Right Side: Independently PT Goal: Rolling Supine to Right Side - Progress: Goal set today Pt will Roll Supine to Left Side: Independently PT Goal: Rolling Supine to Left Side - Progress: Goal set today Pt will go Supine/Side to Sit: with supervision;with HOB 0 degrees PT Goal: Supine/Side to Sit - Progress: Goal set today Pt will go Sit to Supine/Side: with supervision;with HOB 0 degrees PT Goal: Sit to Supine/Side - Progress: Goal set today Pt will go Sit to Stand: with supervision;with upper extremity assist PT Goal: Sit to Stand - Progress: Goal set today Pt will go Stand to Sit: with supervision;with upper extremity assist PT Goal: Stand to Sit - Progress: Goal set today Pt will Transfer Bed to Chair/Chair to Bed: with supervision PT Transfer Goal: Bed to Chair/Chair to Bed - Progress: Goal set today Pt will Ambulate: >150 feet;with supervision;with least restrictive assistive device PT Goal: Ambulate - Progress: Goal set today  PT Evaluation Precautions/Restrictions  Precautions Precautions: Fall Required Braces or Orthoses: No Restrictions Weight Bearing Restrictions: No Prior Functioning  Home Living Lives With: Spouse Receives Help From: Family Type of Home: House Home Layout: One level Home Access: Stairs to enter Entrance Stairs-Rails: Right Entrance Stairs-Number of Steps: 2 Bathroom Shower/Tub: Engineer, manufacturing systems: Standard Home Adaptive Equipment: Environmental consultant - standard Prior Function Level of Independence: Needs assistance with ADLs;Independent with gait Driving: No Cognition Cognition Arousal/Alertness: Awake/alert Overall Cognitive  Status: Impaired Attention: Impaired Current Attention Level: Focused Attention - Other Comments: Easily distracted from task.  Needs redirection Memory: Appears impaired Memory Deficits: Unable to recall that he is in hospital when given that information 2 minutes prior.  Unable to recall details of his home Orientation Level: Oriented to person;Disoriented to place;Disoriented to time;Disoriented to situation Following Commands: Follows one step commands inconsistently;Follows one step commands with increased time Safety/Judgement: Decreased awareness of safety precautions;Decreased safety judgement for tasks assessed Decreased Safety/Judgement: Impulsive;Decreased awareness of need for assistance Safety/Judgement - Other Comments: Needs repeated commands to hold onto RW during gait (lets go to reach for objects). Awareness of Deficits: Decreased awareness of deficits Awareness of Deficits - Other Comments: Unable to state why he is in hospital Problem Solving: Requires assistance for problem solving Cognition - Other Comments: Wife reports patient is much more confused that his baseline. Sensation/Coordination   Extremity Assessment RUE Assessment RUE Assessment: Within Functional Limits LUE Assessment LUE Assessment: Within Functional Limits RLE Assessment RLE Assessment: Within Functional Limits LLE Assessment LLE Assessment: Within Functional Limits Mobility (including Balance) Bed Mobility Bed Mobility: Yes Rolling Left: With rail;4: Min assist Rolling Left Details (indicate cue type and reason): Repeated verbal and tactile cues to complete task Left Sidelying to Sit: 3: Mod assist;With rails;HOB flat Left Sidelying to Sit Details (indicate cue type and reason): Verbal and tactile cues for technique. Sitting - Scoot to Edge of Bed: 4: Min assist;With rail Sitting - Scoot to [place] of Bed Details (indicate cue type and reason): Cues to keep trunk forward over hips while scooting  - tends to lean posteriorly Transfers Transfers: Yes Sit to Stand: 3: Mod assist;With upper extremity assist;From bed;From chair/3-in-1;With armrests Sit to Stand Details (indicate cue type and reason): Verbal and tactile cues for hand placement.  Assist to keep trunk forward rather than pushing backward. Stand to Sit: 4: Min assist;With upper extremity assist;With armrests;To chair/3-in-1 Stand to Sit Details: Cues for hand placement Ambulation/Gait Ambulation/Gait: Yes Ambulation/Gait Assistance: 3: Mod assist Ambulation/Gait Assistance Details (indicate cue type and reason): Patient leaning posteriorly and to right with gait.  Cues to stand up tall.  Needed repeated cues for safe use of RW - keep hand on RW and stay close to RW.  Wife reports this is very different for patient - he was helping her carry in groceries from car a few days ago. Ambulation Distance (Feet): 64 Feet Assistive device: Rolling walker Gait Pattern: Step-through pattern;Decreased stride length;Shuffle;Trunk flexed Gait velocity: Slow gait speed  Posture/Postural Control Posture/Postural Control: Postural limitations Postural Limitations: Flexed posture in sitting and standing Balance Balance Assessed: Yes Static Standing Balance Static Standing - Balance Support: Bilateral upper extremity supported Static Standing - Level of Assistance: 3: Mod assist Dynamic Standing Balance Dynamic Standing - Balance Support: Bilateral upper extremity supported Dynamic Standing - Level of Assistance: 3: Mod assist Dynamic Standing - Comments: Leans posteriorly and to right. Exercise    End of Session PT - End of Session Equipment Utilized During Treatment: Gait belt Activity Tolerance: Patient limited by fatigue (Limited by decreased cognition/safety) Patient left: in chair;with call bell in reach;with family/visitor present Nurse Communication: Mobility status for transfers General Behavior During Session: [facility] for tasks  performed Cognition: Impaired  Vena Austria [phone] 07/16/2011, 1:50 PM

## 2011-07-16 NOTE — Progress Notes (Signed)
PATIENT DETAILS Name: [name] [name] Age: 78 y.o. Sex: male Date of Birth: 1926-07-16 Admit Date: 07/15/2011 [id number] Alphonsus Sias, MD, MD  Subjective: Confused, but seems to answer some questions appropriately  Objective: Vital signs in last 24 hours: Filed Vitals:   07/15/11 1826 07/15/11 2015 07/15/11 2100 07/16/11 0651  BP: 121/74 114/61 118/65 136/62  Pulse: 75 74 74 68  Temp: 98.9 F (37.2 C) 98.9 F (37.2 C) 98.7 F (37.1 C) 97.6 F (36.4 C)  TempSrc: Oral  Oral Oral  Resp: 18 20 18 20   Height:   5\' 9"  (1.753 m)   Weight:   87.7 kg (193 lb 5.5 oz) 88 kg (194 lb 0.1 oz)  SpO2: 96% 95% 96% 92%    Weight change:   Body mass index is 28.65 kg/(m^2).  Intake/Output from previous day:  Intake/Output Summary (Last 24 hours) at 07/16/11 1042 Last data filed at 07/16/11 0100  Gross per 24 hour  Intake      0 ml  Output    275 ml  Net   -275 ml    PHYSICAL EXAM: Gen Exam: Awake and pleasantly confused with clear speech.   Neck: Supple, No JVD.   Chest: B/L Clear.   CVS: S1 S2 Regular, no murmurs.  Abdomen: soft, BS +, non tender, non distended.  Extremities: no edema, lower extremities warm to touch. Neurologic: Non Focal.   Skin: No Rash.   Wounds: N/A.    CONSULTS:  None  LAB RESULTS: CBC  Lab 07/16/11 0500 07/15/11 1844  WBC 9.4 8.3  HGB 13.0 12.9*  HCT 38.4* 37.0*  PLT 135* 148*  MCV 96.7 96.1  MCH 32.7 33.5  MCHC 33.9 34.9  RDW 13.3 13.2  LYMPHSABS -- 0.2*  MONOABS -- 0.3  EOSABS -- 0.0  BASOSABS -- 0.0  BANDABS -- --    Chemistries   Lab 07/16/11 0500 07/15/11 1844  NA 139 140  K 5.0 3.1*  CL 107 107  CO2 22 24  GLUCOSE 94 111*  BUN 11 13  CREATININE 0.58 0.71  CALCIUM 9.3 9.2  MG -- --    GFR Estimated Creatinine Clearance: 78.1 ml/min (by C-G formula based on Cr of 0.58).  Coagulation profile  Lab 07/16/11 0500  INR 1.20  PROTIME --    Cardiac Enzymes No results found for this basename:  CK:3,CKMB:3,TROPONINI:3,MYOGLOBIN:3 in the last 168 hours  No components found with this basename: POCBNP:3 No results found for this basename: DDIMER:2 in the last 72 hours No results found for this basename: HGBA1C:2 in the last 72 hours No results found for this basename: CHOL:2,HDL:2,LDLCALC:2,TRIG:2,CHOLHDL:2,LDLDIRECT:2 in the last 72 hours No results found for this basename: TSH,T4TOTAL,FREET3,T3FREE,THYROIDAB in the last 72 hours No results found for this basename: VITAMINB12:2,FOLATE:2,FERRITIN:2,TIBC:2,IRON:2,RETICCTPCT:2 in the last 72 hours No results found for this basename: LIPASE:2,AMYLASE:2 in the last 72 hours  Urine Studies No results found for this basename: UACOL:2,UAPR:2,USPG:2,UPH:2,UTP:2,UGL:2,UKET:2,UBIL:2,UHGB:2,UNIT:2,UROB:2,ULEU:2,UEPI:2,UWBC:2,URBC:2,UBAC:2,CAST:2,CRYS:2,UCOM:2,BILUA:2 in the last 72 hours  MICROBIOLOGY: No results found for this or any previous visit (from the past 240 hour(s)).  RADIOLOGY STUDIES/RESULTS: Dg Chest 2 View  07/15/2011  *RADIOLOGY REPORT*  Clinical Data: Confusion.  Weakness.  CHEST - 2 VIEW  Comparison: Two-view chest 11/20/2007.  Findings: The heart size is normal.  Mild emphysematous changes are again noted.  The previously seen hiatal hernia is no longer present.  There is mild dilation of the visualized bowel.  Minimal bibasilar atelectasis is present.  IMPRESSION:  1.  Minimal bibasilar airspace disease likely reflects  atelectasis. 2.  The hiatal hernia is no longer visible. 3.  Dilated loops of bowel are present just under the diaphragm.  Original Report Authenticated By: Jamesetta Orleans. MATTERN, M.D.    MEDICATIONS: Scheduled Meds:   . sodium chloride   Intravenous STAT  . ciprofloxacin  400 mg Intravenous Once  . ciprofloxacin  400 mg Intravenous Q12H  . citalopram  20 mg Oral Daily  . felodipine  5 mg Oral Daily  . heparin  5,000 Units Subcutaneous Q8H  . LORazepam  1 mg Oral Once  . potassium chloride  20 mEq Oral  Once  . potassium chloride  20 mEq Oral Once  . sodium chloride  500 mL Intravenous Once   Continuous Infusions:   . 0.9 % NaCl with KCl 20 mEq / L     PRN Meds:.albuterol, guaiFENesin-dextromethorphan, haloperidol lactate, HYDROcodone-acetaminophen, ondansetron (ZOFRAN) IV, ondansetron, DISCONTD: ondansetron (ZOFRAN) IV  Antibiotics: Anti-infectives     Start     Dose/Rate Route Frequency Ordered Stop   07/16/11 0800   ciprofloxacin (CIPRO) IVPB 400 mg        400 mg 200 mL/hr over 60 Minutes Intravenous Every 12 hours 07/16/11 0202     07/15/11 2015   ciprofloxacin (CIPRO) IVPB 400 mg        400 mg 200 mL/hr over 60 Minutes Intravenous  Once 07/15/11 2005 07/15/11 2112          Assessment/Plan: Patient Active Hospital Problem List: UTI (lower urinary tract infection) -Afebrile -Continue with ciprofloxacin -Await urine culture results  Deconditioning/weakness -Secondary to above -Await PT OT eval  Hypertension -Controlled -Continue with felodipine  Depression with anxiety -Continue with Celexa -Resume as needed Xanax  Alzheimer's dementia -Seems to be at baseline  Osteoarthritis -Likely the cause of mild ankle pain-left. There is no swelling erythema or tenderness on exam -As needed Tylenol or Norco  Disposition: -Remain inpatient-await PT OT eval  DVT Prophylaxis: -Begin prophylactic Lovenox  Code Status: Full code  Maretta Bees,  MD. 07/16/2011, 10:42 AM

## 2011-07-17 NOTE — Progress Notes (Signed)
Clinical Child psychotherapist (CSW) completed psychosocial assessment with pt wife, which can be found in pt shadow chart. CSW informed that pt goes to [facility] a few days a week for daycare and pt wife would like pt to go there for short term rehab or [facility] in [place]. Wife had questions regarding the process of long term care. CSW informed wife that if she feels that pt will need to be placed at a facility for long term care once he completes his days then wife will need to either pay privately for placement or apply for Medicaid. CSW informed wife that the facility will also assist with that process. CSW will fax pt out and follow up with bed offers.   Theresia Bough, MSW, Theresia Majors [phone]

## 2011-07-17 NOTE — Progress Notes (Signed)
Utilization Review Completed.  [name] T  07/17/2011  

## 2011-07-17 NOTE — Evaluation (Signed)
Occupational Therapy Evaluation Patient Details Name: [name] [name] MRN: [id number] DOB: 1927-01-23 Today's Date: 07/17/2011  Problem List:  Patient Active Problem List  Diagnoses  . B12 DEFICIENCY  . ANXIETY  . ALZHEIMERS DISEASE  . HYPERTENSION  . VENOUS INSUFFICIENCY  . GERD  . CONSTIPATION  . ARTHRITIS  . DIVERTICULOSIS, COLON, HX OF  . UTI (lower urinary tract infection)    Past Medical History:  Past Medical History  Diagnosis Date  . GERD (gastroesophageal reflux disease)   . Hypertension   . Anxiety   . Alzheimer disease   . DIVERTICULOSIS, COLON, HX OF 09/19/2006  . ARTHRITIS 09/19/2006  . VENOUS INSUFFICIENCY 09/19/2006  . HYPERTENSION 09/19/2006  . ALZHEIMERS DISEASE 09/25/2008  . B12 DEFICIENCY 11/23/2008   Past Surgical History:  Past Surgical History  Procedure Date  . Appendectomy 1980  . Eye surgery 2005    laser eye  . Back surgery 1982, 1992  . Shoulder surgery 2004    right  . Cataract extraction, bilateral 2004    OT Assessment/Plan/Recommendation OT Assessment Clinical Impression Statement: Pt presents to OT with decreased I with ADL activity due to decreased balance and decreased cognition.  Pt will benefit from skilled OT to increase I with ADL activity and decrease burden on wife OT Recommendation/Assessment: Patient will need skilled OT in the acute care venue OT Problem List: Decreased strength;Decreased activity tolerance;Decreased cognition;Decreased safety awareness OT Therapy Diagnosis : Generalized weakness;Altered mental status OT Plan OT Frequency: Min 1X/week OT Treatment/Interventions: Self-care/ADL training;Patient/family education;Balance training OT Recommendation Equipment Recommended: Defer to next venue Individuals Consulted Consulted and Agree with Results and Recommendations: Patient OT Goals Acute Rehab OT Goals OT Goal Formulation: With patient/family Time For Goal Achievement: 2 weeks ADL Goals Pt Will Perform  Grooming: with set-up;Standing at sink ADL Goal: Grooming - Progress: Goal set today Pt Will Perform Toileting - Clothing Manipulation: with set-up;Standing ADL Goal: Toileting - Clothing Manipulation - Progress: Goal set today  OT Evaluation Precautions/Restrictions  Precautions Precautions: Fall Precaution Comments: Pt with decreased safety awareness Required Braces or Orthoses: No Restrictions Weight Bearing Restrictions: No      ADL ADL Eating/Feeding: Performed;Set up Where Assessed - Eating/Feeding: Chair Grooming: Performed;Minimal assistance Where Assessed - Grooming: Standing at sink Upper Body Bathing: Performed Where Assessed - Upper Body Bathing: Sitting, chair Lower Body Bathing: Moderate assistance Where Assessed - Lower Body Bathing: Sit to stand from chair Upper Body Dressing: Simulated;Minimal assistance Lower Body Dressing: Simulated;Moderate assistance Where Assessed - Lower Body Dressing: Sit to stand from chair Toilet Transfer: Minimal assistance Toilet Transfer Method: Ambulating Toilet Transfer Equipment: Comfort height toilet Toileting - Clothing Manipulation: Simulated;Minimal assistance Where Assessed - Toileting Clothing Manipulation: Standing Toileting - Hygiene: Simulated;Minimal assistance Ambulation Related to ADLs: pt unsteady at times with posterior lean    Cognition Cognition Arousal/Alertness: Awake/alert Overall Cognitive Status: Impaired Current Attention Level: Focused Memory: Appears impaired Orientation Level: Oriented to person Following Commands: Follows one step commands inconsistently Safety/Judgement: Decreased safety judgement for tasks assessed Decreased Safety/Judgement: Impulsive;Decreased awareness of need for assistance Awareness of Deficits: Decreased awareness of deficits    Extremity Assessment RUE Assessment RUE Assessment: Within Functional Limits LUE Assessment LUE Assessment: Within Functional  Limits Mobility  Bed Mobility Bed Mobility: No Transfers Sit to Stand: 4: Min assist    End of Session OT - End of Session Activity Tolerance: Patient tolerated treatment well Patient left: in chair General Behavior During Session: [facility] for tasks performed Cognition: Impaired, at baseline  Alba Cory 07/17/2011, 1:10 PM

## 2011-07-17 NOTE — Progress Notes (Signed)
Physical Therapy Treatment Patient Details Name: [name] [name] MRN: [id number] DOB: 1926-10-01 Today's Date: 07/17/2011  PT Assessment/Plan  PT - Assessment/Plan Comments on Treatment Session: Patient with decreased safety awareness this session. Pt tends to be a "joker" and becomes unsafe (letting go of walker, throwing hands up with laughing) Patients family present throughout and stated that his behavior is closer to baseline but still more confused than prior to admission.  PT Plan: Discharge plan remains appropriate PT Frequency: Min 3X/week Follow Up Recommendations: Skilled nursing facility Equipment Recommended: Defer to next venue PT Goals  Acute Rehab PT Goals PT Goal: Sit to Stand - Progress: Progressing toward goal PT Goal: Stand to Sit - Progress: Progressing toward goal PT Transfer Goal: Bed to Chair/Chair to Bed - Progress: Progressing toward goal PT Goal: Ambulate - Progress: Progressing toward goal  PT Treatment Precautions/Restrictions  Precautions Precautions: Fall Precaution Comments: Pt with decreased safety awareness Required Braces or Orthoses: No Restrictions Weight Bearing Restrictions: No Mobility (including Balance) Bed Mobility Bed Mobility: No Transfers Sit to Stand: 4: Min assist;With armrests;From chair/3-in-1;With upper extremity assist Sit to Stand Details (indicate cue type and reason): Cues for safe hand placement and positioning before standing. A to ensure balance Stand to Sit: 4: Min assist;With upper extremity assist;With armrests;To chair/3-in-1 Stand to Sit Details: Cues to sit slowly and for safe hand placement.  Ambulation/Gait Ambulation/Gait Assistance: 4: Min assist Ambulation/Gait Assistance Details (indicate cue type and reason): A for balance. Pt continued to have posterior lean with ambulation. Cues to stand tall and with upright posture. Pt with decrease safety with RW management Ambulation Distance (Feet): 180 Feet Assistive  device: Rolling walker Gait Pattern: Step-through pattern;Decreased stride length;Trunk flexed Gait velocity: decreased but not formally measured     Exercise    End of Session PT - End of Session Equipment Utilized During Treatment: Gait belt Activity Tolerance: Patient tolerated treatment well Patient left: in chair Nurse Communication: Mobility status for transfers;Mobility status for ambulation General Behavior During Session: [facility] for tasks performed Cognition: Impaired, at baseline  Shail Urbas, Adline Potter 07/17/2011, 1:15 PM  07/17/2011 Fredrich Birks PTA [phone] pager [phone] office

## 2011-07-17 NOTE — Progress Notes (Signed)
PATIENT DETAILS Name: [name] [name] Age: 78 y.o. Sex: male Date of Birth: 21-Oct-1926 Admit Date: 07/15/2011 [id number] Alphonsus Sias, MD, MD  Subjective: No major events overnight Awake and alert at times  Objective: Vital signs in last 24 hours: Filed Vitals:   07/15/11 2100 07/16/11 0651 07/16/11 2101 07/17/11 0631  BP: 118/65 136/62 140/74 138/72  Pulse: 74 68 85 71  Temp: 98.7 F (37.1 C) 97.6 F (36.4 C) 98.4 F (36.9 C) 97.8 F (36.6 C)  TempSrc: Oral Oral Oral Oral  Resp: 18 20 20 20   Height: 5\' 9"  (1.753 m)     Weight: 87.7 kg (193 lb 5.5 oz) 88 kg (194 lb 0.1 oz)  84 kg (185 lb 3 oz)  SpO2: 96% 92% 92% 96%    Weight change: -3.7 kg (-8 lb 2.5 oz)  Body mass index is 27.35 kg/(m^2).  Intake/Output from previous day:  Intake/Output Summary (Last 24 hours) at 07/17/11 1042 Last data filed at 07/17/11 0852  Gross per 24 hour  Intake    840 ml  Output    372 ml  Net    468 ml    PHYSICAL EXAM: Gen Exam: Awake and pleasantly confused with clear speech.   Neck: Supple, No JVD.   Chest: B/L Clear.   CVS: S1 S2 Regular, no murmurs.  Abdomen: soft, BS +, non tender, non distended.  Extremities: no edema, lower extremities warm to touch. Neurologic: Non Focal.   Skin: No Rash.   Wounds: N/A.    CONSULTS:  None  LAB RESULTS: CBC  Lab 07/16/11 0500 07/15/11 1844  WBC 9.4 8.3  HGB 13.0 12.9*  HCT 38.4* 37.0*  PLT 135* 148*  MCV 96.7 96.1  MCH 32.7 33.5  MCHC 33.9 34.9  RDW 13.3 13.2  LYMPHSABS -- 0.2*  MONOABS -- 0.3  EOSABS -- 0.0  BASOSABS -- 0.0  BANDABS -- --    Chemistries   Lab 07/16/11 0500 07/15/11 1844  NA 139 140  K 5.0 3.1*  CL 107 107  CO2 22 24  GLUCOSE 94 111*  BUN 11 13  CREATININE 0.58 0.71  CALCIUM 9.3 9.2  MG -- --    GFR Estimated Creatinine Clearance: 71.2 ml/min (by C-G formula based on Cr of 0.58).  Coagulation profile  Lab 07/16/11 0500  INR 1.20  PROTIME --    Cardiac Enzymes No results found for this  basename: CK:3,CKMB:3,TROPONINI:3,MYOGLOBIN:3 in the last 168 hours  No components found with this basename: POCBNP:3 No results found for this basename: DDIMER:2 in the last 72 hours No results found for this basename: HGBA1C:2 in the last 72 hours No results found for this basename: CHOL:2,HDL:2,LDLCALC:2,TRIG:2,CHOLHDL:2,LDLDIRECT:2 in the last 72 hours  Basename 07/16/11 0500  TSH 2.268  T4TOTAL --  T3FREE --  THYROIDAB --   No results found for this basename: VITAMINB12:2,FOLATE:2,FERRITIN:2,TIBC:2,IRON:2,RETICCTPCT:2 in the last 72 hours No results found for this basename: LIPASE:2,AMYLASE:2 in the last 72 hours  Urine Studies No results found for this basename: UACOL:2,UAPR:2,USPG:2,UPH:2,UTP:2,UGL:2,UKET:2,UBIL:2,UHGB:2,UNIT:2,UROB:2,ULEU:2,UEPI:2,UWBC:2,URBC:2,UBAC:2,CAST:2,CRYS:2,UCOM:2,BILUA:2 in the last 72 hours  MICROBIOLOGY: Recent Results (from the past 240 hour(s))  URINE CULTURE     Status: Normal (Preliminary result)   Collection Time   07/15/11  7:17 PM      Component Value Range Status Comment   Specimen Description URINE, RANDOM   Final    Special Requests NONE   Final    Culture  Setup Time [id number]   Final    Colony Count >=100,000 COLONIES/ML  Final    Culture ESCHERICHIA COLI   Final    Report Status PENDING   Incomplete     RADIOLOGY STUDIES/RESULTS: Dg Chest 2 View  07/15/2011  *RADIOLOGY REPORT*  Clinical Data: Confusion.  Weakness.  CHEST - 2 VIEW  Comparison: Two-view chest 11/20/2007.  Findings: The heart size is normal.  Mild emphysematous changes are again noted.  The previously seen hiatal hernia is no longer present.  There is mild dilation of the visualized bowel.  Minimal bibasilar atelectasis is present.  IMPRESSION:  1.  Minimal bibasilar airspace disease likely reflects atelectasis. 2.  The hiatal hernia is no longer visible. 3.  Dilated loops of bowel are present just under the diaphragm.  Original Report Authenticated By: Jamesetta Orleans.  MATTERN, M.D.    MEDICATIONS: Scheduled Meds:    . sodium chloride   Intravenous STAT  . ciprofloxacin  500 mg Oral BID  . citalopram  20 mg Oral Daily  . felodipine  5 mg Oral Daily  . heparin  5,000 Units Subcutaneous Q8H  . LORazepam  2 mg Oral Once  . potassium chloride  20 mEq Oral Once  . DISCONTD: ciprofloxacin  400 mg Intravenous Q12H  . DISCONTD: ciprofloxacin  400 mg Intravenous Q12H  . DISCONTD: ciprofloxacin  500 mg Oral BID   Continuous Infusions:    . DISCONTD: 0.9 % NaCl with KCl 20 mEq / L     PRN Meds:.acetaminophen, albuterol, ALPRAZolam, guaiFENesin-dextromethorphan, haloperidol lactate, HYDROcodone-acetaminophen, ondansetron (ZOFRAN) IV, ondansetron  Antibiotics: Anti-infectives     Start     Dose/Rate Route Frequency Ordered Stop   07/16/11 2000   ciprofloxacin (CIPRO) tablet 500 mg  Status:  Discontinued        500 mg Oral 2 times daily 07/16/11 1350 07/16/11 1413   07/16/11 2000   ciprofloxacin (CIPRO) tablet 500 mg        500 mg Oral 2 times daily 07/16/11 1737     07/16/11 1500   ciprofloxacin (CIPRO) IVPB 400 mg  Status:  Discontinued        400 mg 200 mL/hr over 60 Minutes Intravenous Every 12 hours 07/16/11 1413 07/16/11 1737   07/16/11 0800   ciprofloxacin (CIPRO) IVPB 400 mg  Status:  Discontinued        400 mg 200 mL/hr over 60 Minutes Intravenous Every 12 hours 07/16/11 0202 07/16/11 1350   07/15/11 2015   ciprofloxacin (CIPRO) IVPB 400 mg        400 mg 200 mL/hr over 60 Minutes Intravenous  Once 07/15/11 2005 07/15/11 2112          Assessment/Plan: Patient Active Hospital Problem List: UTI (lower urinary tract infection) -Afebrile -Continue with ciprofloxacin -Urine culture growing E.Coli-awaiting sensitivities -unfortunately patient has pulled out numerous IV lines, so placed on oral   Deconditioning/weakness -Secondary to above -continue with PT/OT  Hypertension -Controlled -Continue with felodipine  Depression with  anxiety -Continue with Celexa -Resume as needed Xanax  Alzheimer's dementia with delirium -currently stable -as needed haldol  Osteoarthritis -Likely the cause of mild ankle pain-left. There is no swelling erythema or tenderness on exam -As needed Tylenol or Norco  Disposition: -SNF   DVT Prophylaxis: -Begin prophylactic Lovenox  Code Status: Full code  Maretta Bees,  MD. 07/17/2011, 10:42 AM

## 2011-07-18 LAB — URINE CULTURE
Colony Count: >=100000
Colony Count: NO GROWTH
Culture  Setup Time: 201302030206
Culture  Setup Time: 201302040827
Culture: NO GROWTH

## 2011-07-18 MED ORDER — CLONAZEPAM 0.5 MG PO TABS
0.5000 mg | ORAL_TABLET | Freq: Two times a day (BID) | ORAL | Status: DC
  Administered 2011-07-18 – 2011-07-19 (×2): 0.5 mg via ORAL
  Filled 2011-07-18 (×2): qty 1

## 2011-07-18 MED ORDER — ALPRAZOLAM 0.25 MG PO TABS
0.2500 mg | ORAL_TABLET | Freq: Two times a day (BID) | ORAL | Status: DC
  Administered 2011-07-18: 0.25 mg via ORAL
  Filled 2011-07-18: qty 1

## 2011-07-18 NOTE — Progress Notes (Signed)
24hr sitter d/c'd by Dr. Jerral Ralph for placement to SNF tomorrow.  Pt in video room.  Low bed ordered.  Bed alarm on.  Pt alert but very confused.  Wife at bedside presently.  Brought to nurses desk in recliner when wife had to leave.  Continuously trying to get out of chair even with staff at side.  Pt taken back to room by 2 staff members.  Becoming increasingly belligerent.  Also again continuously trying to get out of chair, confused.  1542 xanax 0.25mg  po given as ordered.  Reassured and reminded he is at hospital but pt cannot remember where he is, thinking he needs to "do a job, get on my tractor, fix my truck".  Staff phoned wife so pt could speak to her and be reassured but it did not calm pt down.  No relief with medication even after an hour.  Dr. Jerral Ralph paged for further orders.  Explained pt is definitely a one on one for safety reasons.  New orders received.  Andy CNA in room at present.  1630 AC advised of situation.    Sitter obtained for safety of pt.  1700 pt's grandson here also but unable to stay for long.  Family aware of situation.  Sitter at bedside.   Ebony Hail RN 07/18/11 1812

## 2011-07-18 NOTE — Progress Notes (Signed)
Physical Therapy Treatment Patient Details Name: [name] [name] MRN: [id number] DOB: 1927/05/29 Today's Date: 07/18/2011  PT Assessment/Plan  PT - Assessment/Plan Comments on Treatment Session: Pt continues with confusion and decrease safety awareness. Pt very sweet and agreeable to ambulation PT Plan: Discharge plan remains appropriate PT Frequency: Min 3X/week Follow Up Recommendations: Skilled nursing facility Equipment Recommended: Defer to next venue PT Goals  Acute Rehab PT Goals PT Goal: Rolling Supine to Right Side - Progress: Met PT Goal: Rolling Supine to Left Side - Progress: Met PT Goal: Supine/Side to Sit - Progress: Met PT Goal: Sit to Supine/Side - Progress: Progressing toward goal PT Goal: Sit to Stand - Progress: Progressing toward goal PT Goal: Stand to Sit - Progress: Progressing toward goal PT Transfer Goal: Bed to Chair/Chair to Bed - Progress: Progressing toward goal PT Goal: Ambulate - Progress: Progressing toward goal  PT Treatment Precautions/Restrictions  Precautions Precautions: Fall Precaution Comments: Pt educated on fall risk and use of call bell. Pt has sitter at this time Required Braces or Orthoses: No Restrictions Weight Bearing Restrictions: No Mobility (including Balance) Bed Mobility Rolling Left: 6: Modified independent (Device/Increase time) Left Sidelying to Sit: 6: Modified independent (Device/Increase time) Sitting - Scoot to Edge of Bed: 6: Modified independent (Device/Increase time) Sit to Supine: 6: Modified independent (Device/Increase time) Transfers Sit to Stand: 4: Min assist;Other (comment);From chair/3-in-1;With upper extremity assist Stand to Sit: 4: Min assist;To chair/3-in-1;With upper extremity assist;With armrests Stand to Sit Details: Pt sit <>stand x10 for instruction and strengthening. Cues for safest technique and hand placement. A to ensure balance and to control descent into chair.   Ambulation/Gait Ambulation/Gait: Yes Ambulation/Gait Assistance: 4: Min assist Ambulation/Gait Assistance Details (indicate cue type and reason): Pt continued with posterior lean. Ambulated unsafe with RW and impulsive with use of RW. Ambulated without RW and occasional HHA. pt with 3 LOB when turning.  Ambulation Distance (Feet): 320 Feet Assistive device: 1 person hand held assist Gait Pattern: Trunk flexed;Shuffle;Scissoring Gait velocity: decreased    Exercise  General Exercises - Lower Extremity Long Arc Quad: AROM;Both;10 reps Hip Flexion/Marching: AROM;Both;10 reps End of Session PT - End of Session Equipment Utilized During Treatment: Gait belt Activity Tolerance: Patient tolerated treatment well Patient left: in chair;with call bell in reach;Other (comment) (with sitter) Nurse Communication: Mobility status for transfers;Mobility status for ambulation General Behavior During Session: [facility] for tasks performed Cognition: Impaired, at baseline  Fredrich Birks 07/18/2011, 10:18 AM 07/18/2011 Fredrich Birks PTA [phone] pager [phone] office

## 2011-07-18 NOTE — Progress Notes (Signed)
Clinical Child psychotherapist (CSW) informed that pt has a Recruitment consultant therefore pt will not be able to dc today as pt must be sitter free for 24hrs. CSW informed that [facility] will review pt information and inform CSW if they are able to offer placement for tomorrow. CSW left a message for pt wife to provide update.  Theresia Bough, MSW, Theresia Majors [phone]

## 2011-07-18 NOTE — Progress Notes (Signed)
PATIENT DETAILS Name: [name] [name] Age: 78 y.o. Sex: male Date of Birth: [birth year] Admit Date: 07/15/2011 [id number] Alphonsus Sias, MD, MD  Subjective: No major events overnight-very pleasantly confused Wife at bedside  Objective: Vital signs in last 24 hours: Filed Vitals:   07/16/11 2101 07/17/11 0631 07/17/11 1735 07/18/11 0500  BP: 140/74 138/72 133/75 137/89  Pulse: 85 71 72 88  Temp: 98.4 F (36.9 C) 97.8 F (36.6 C) 98.1 F (36.7 C) 97.1 F (36.2 C)  TempSrc: Oral Oral Oral Oral  Resp: 20 20 20 19   Height:      Weight:  84 kg (185 lb 3 oz)  83.462 kg (184 lb)  SpO2: 92% 96% 95% 94%    Weight change: -0.538 kg (-1 lb 3 oz)  Body mass index is 27.17 kg/(m^2).  Intake/Output from previous day:  Intake/Output Summary (Last 24 hours) at 07/18/11 1442 Last data filed at 07/17/11 1857  Gross per 24 hour  Intake    120 ml  Output    275 ml  Net   -155 ml    PHYSICAL EXAM: Gen Exam: Awake and pleasantly confused with clear speech.   Neck: Supple, No JVD.   Chest: B/L Clear.   CVS: S1 S2 Regular, no murmurs.  Abdomen: soft, BS +, non tender, non distended.  Extremities: no edema, lower extremities warm to touch. Neurologic: Non Focal.   Skin: No Rash.   Wounds: N/A.    CONSULTS:  None  LAB RESULTS: CBC  Lab 07/16/11 0500 07/15/11 1844  WBC 9.4 8.3  HGB 13.0 12.9*  HCT 38.4* 37.0*  PLT 135* 148*  MCV 96.7 96.1  MCH 32.7 33.5  MCHC 33.9 34.9  RDW 13.3 13.2  LYMPHSABS -- 0.2*  MONOABS -- 0.3  EOSABS -- 0.0  BASOSABS -- 0.0  BANDABS -- --    Chemistries   Lab 07/16/11 0500 07/15/11 1844  NA 139 140  K 5.0 3.1*  CL 107 107  CO2 22 24  GLUCOSE 94 111*  BUN 11 13  CREATININE 0.58 0.71  CALCIUM 9.3 9.2  MG -- --    GFR Estimated Creatinine Clearance: 71.2 ml/min (by C-G formula based on Cr of 0.58).  Coagulation profile  Lab 07/16/11 0500  INR 1.20  PROTIME --    Cardiac Enzymes No results found for this basename:  CK:3,CKMB:3,TROPONINI:3,MYOGLOBIN:3 in the last 168 hours  No components found with this basename: POCBNP:3 No results found for this basename: DDIMER:2 in the last 72 hours No results found for this basename: HGBA1C:2 in the last 72 hours No results found for this basename: CHOL:2,HDL:2,LDLCALC:2,TRIG:2,CHOLHDL:2,LDLDIRECT:2 in the last 72 hours  Basename 07/16/11 0500  TSH 2.268  T4TOTAL --  T3FREE --  THYROIDAB --   No results found for this basename: VITAMINB12:2,FOLATE:2,FERRITIN:2,TIBC:2,IRON:2,RETICCTPCT:2 in the last 72 hours No results found for this basename: LIPASE:2,AMYLASE:2 in the last 72 hours  Urine Studies No results found for this basename: UACOL:2,UAPR:2,USPG:2,UPH:2,UTP:2,UGL:2,UKET:2,UBIL:2,UHGB:2,UNIT:2,UROB:2,ULEU:2,UEPI:2,UWBC:2,URBC:2,UBAC:2,CAST:2,CRYS:2,UCOM:2,BILUA:2 in the last 72 hours  MICROBIOLOGY: Recent Results (from the past 240 hour(s))  URINE CULTURE     Status: Normal   Collection Time   07/15/11  7:17 PM      Component Value Range Status Comment   Specimen Description URINE, RANDOM   Final    Special Requests NONE   Final    Culture  Setup Time [id number]   Final    Colony Count >=100,000 COLONIES/ML   Final    Culture ESCHERICHIA COLI   Final  Report Status 07/18/2011 FINAL   Final    Organism ID, Bacteria ESCHERICHIA COLI   Final   URINE CULTURE     Status: Normal   Collection Time   07/17/11  7:18 AM      Component Value Range Status Comment   Specimen Description URINE, CLEAN CATCH   Final    Special Requests NONE   Final    Culture  Setup Time [id number]   Final    Colony Count NO GROWTH   Final    Culture NO GROWTH   Final    Report Status 07/18/2011 FINAL   Final     RADIOLOGY STUDIES/RESULTS: Dg Chest 2 View  07/15/2011  *RADIOLOGY REPORT*  Clinical Data: Confusion.  Weakness.  CHEST - 2 VIEW  Comparison: Two-view chest 11/20/2007.  Findings: The heart size is normal.  Mild emphysematous changes are again noted.  The  previously seen hiatal hernia is no longer present.  There is mild dilation of the visualized bowel.  Minimal bibasilar atelectasis is present.  IMPRESSION:  1.  Minimal bibasilar airspace disease likely reflects atelectasis. 2.  The hiatal hernia is no longer visible. 3.  Dilated loops of bowel are present just under the diaphragm.  Original Report Authenticated By: Jamesetta Orleans. MATTERN, M.D.    MEDICATIONS: Scheduled Meds:    . ciprofloxacin  500 mg Oral BID  . citalopram  20 mg Oral Daily  . felodipine  5 mg Oral Daily  . heparin  5,000 Units Subcutaneous Q8H  . potassium chloride  20 mEq Oral Once   Continuous Infusions:   PRN Meds:.acetaminophen, albuterol, ALPRAZolam, guaiFENesin-dextromethorphan, haloperidol lactate, HYDROcodone-acetaminophen, ondansetron (ZOFRAN) IV, ondansetron  Antibiotics: Anti-infectives     Start     Dose/Rate Route Frequency Ordered Stop   07/16/11 2000   ciprofloxacin (CIPRO) tablet 500 mg  Status:  Discontinued        500 mg Oral 2 times daily 07/16/11 1350 07/16/11 1413   07/16/11 2000   ciprofloxacin (CIPRO) tablet 500 mg        500 mg Oral 2 times daily 07/16/11 1737     07/16/11 1500   ciprofloxacin (CIPRO) IVPB 400 mg  Status:  Discontinued        400 mg 200 mL/hr over 60 Minutes Intravenous Every 12 hours 07/16/11 1413 07/16/11 1737   07/16/11 0800   ciprofloxacin (CIPRO) IVPB 400 mg  Status:  Discontinued        400 mg 200 mL/hr over 60 Minutes Intravenous Every 12 hours 07/16/11 0202 07/16/11 1350   07/15/11 2015   ciprofloxacin (CIPRO) IVPB 400 mg        400 mg 200 mL/hr over 60 Minutes Intravenous  Once 07/15/11 2005 07/15/11 2112          Assessment/Plan: Patient Active Hospital Problem List: UTI (lower urinary tract infection) -Afebrile -Continue with ciprofloxacin -Urine culture growing E.Coli-sensitive to cipro -unfortunately patient has pulled out numerous IV lines, so placed on oral  cipro  Deconditioning/weakness -Secondary to above -continue with PT/OT  Hypertension -Controlled -Continue with felodipine  Depression with anxiety -Continue with Celexa -Change Xanax to BID-per wife he takes it twice daily-scheduled basis  Alzheimer's dementia with delirium -currently stable -as needed haldol  Osteoarthritis -Likely the cause of mild ankle pain-left. There is no swelling erythema or tenderness on exam -As needed Tylenol or Norco  Disposition: -SNF   DVT Prophylaxis: -Begin prophylactic Lovenox  Code Status: Full code  Maretta Bees,  MD.  07/18/2011, 2:42 PM

## 2011-07-19 DIAGNOSIS — K219 Gastro-esophageal reflux disease without esophagitis: Secondary | ICD-10-CM | POA: Diagnosis not present

## 2011-07-19 DIAGNOSIS — N390 Urinary tract infection, site not specified: Secondary | ICD-10-CM | POA: Diagnosis not present

## 2011-07-19 DIAGNOSIS — I10 Essential (primary) hypertension: Secondary | ICD-10-CM | POA: Diagnosis not present

## 2011-07-19 DIAGNOSIS — M6281 Muscle weakness (generalized): Secondary | ICD-10-CM | POA: Diagnosis not present

## 2011-07-19 DIAGNOSIS — R279 Unspecified lack of coordination: Secondary | ICD-10-CM | POA: Diagnosis not present

## 2011-07-19 DIAGNOSIS — R262 Difficulty in walking, not elsewhere classified: Secondary | ICD-10-CM | POA: Diagnosis not present

## 2011-07-19 DIAGNOSIS — G309 Alzheimer's disease, unspecified: Secondary | ICD-10-CM | POA: Diagnosis not present

## 2011-07-19 DIAGNOSIS — F411 Generalized anxiety disorder: Secondary | ICD-10-CM | POA: Diagnosis not present

## 2011-07-19 DIAGNOSIS — R5383 Other fatigue: Secondary | ICD-10-CM | POA: Diagnosis not present

## 2011-07-19 DIAGNOSIS — E876 Hypokalemia: Secondary | ICD-10-CM | POA: Diagnosis not present

## 2011-07-19 DIAGNOSIS — R5381 Other malaise: Secondary | ICD-10-CM | POA: Diagnosis not present

## 2011-07-19 DIAGNOSIS — K5900 Constipation, unspecified: Secondary | ICD-10-CM | POA: Diagnosis not present

## 2011-07-19 DIAGNOSIS — E782 Mixed hyperlipidemia: Secondary | ICD-10-CM | POA: Diagnosis not present

## 2011-07-19 DIAGNOSIS — IMO0002 Reserved for concepts with insufficient information to code with codable children: Secondary | ICD-10-CM | POA: Diagnosis not present

## 2011-07-19 DIAGNOSIS — F0280 Dementia in other diseases classified elsewhere without behavioral disturbance: Secondary | ICD-10-CM | POA: Diagnosis not present

## 2011-07-19 MED ORDER — HALOPERIDOL 1 MG PO TABS
1.0000 mg | ORAL_TABLET | Freq: Four times a day (QID) | ORAL | Status: DC | PRN
  Filled 2011-07-19: qty 1

## 2011-07-19 MED ORDER — HALOPERIDOL LACTATE 5 MG/ML IJ SOLN
0.5000 mg | Freq: Four times a day (QID) | INTRAMUSCULAR | Status: DC | PRN
Start: 2011-07-19 — End: 2011-07-19
  Administered 2011-07-19: 0.5 mg via INTRAMUSCULAR

## 2011-07-19 MED ORDER — HALOPERIDOL 1 MG PO TABS: 1.0000 mg | ORAL_TABLET | Freq: Four times a day (QID) | ORAL | Status: DC | PRN

## 2011-07-19 MED ORDER — HALOPERIDOL LACTATE 5 MG/ML IJ SOLN
INTRAMUSCULAR | Status: AC
  Administered 2011-07-19: 0.5 mg via INTRAMUSCULAR
  Filled 2011-07-19: qty 1

## 2011-07-19 MED ORDER — CIPROFLOXACIN HCL 500 MG PO TABS: 500.0000 mg | ORAL_TABLET | Freq: Two times a day (BID) | ORAL | Status: AC

## 2011-07-19 MED ORDER — GUAIFENESIN-DM 100-10 MG/5ML PO SYRP: 5.0000 mL | ORAL_SOLUTION | ORAL | Status: AC | PRN

## 2011-07-19 NOTE — Progress Notes (Signed)
Clinical Child psychotherapist (CSW) confirmed that [facility] able to offer pt placement today. Facility aware that pt had a sitter until this morning. Pt has been given haldol and pt remains calm in the bed. CSW placed pt dc packet in the [place], informed pt wife and grandson who were in the room. CSW also confirmed pt wife can complete paperwork tomorrow. CSW has contacted [facility] for a 2:30pm pick up. CSW is signing off. Theresia Bough, MSW, Theresia Majors [phone]

## 2011-07-19 NOTE — Discharge Summary (Signed)
Patient ID: [name] [name] MRN: [id number] DOB/AGE: 11/30/1926 78 y.o.  Admit date: 07/15/2011 Discharge date: 07/19/2011  Primary Care Physician:  Tillman Abide, MD, MD  Hospital Course:   Principal Problem:   *UTI (lower urinary tract infection) - patient was started on Ciprofloxacin  - remains afebrile since the admission - we will continue antibiotic on discharge Ciprofloxacin 500 mg daily for 5 more days to end 07/24/2011  Active Problems:   HYPERTENSION - BP 13675, at goal at the time of discharge - continue felodipine 5 mg a day   ALZHEIMERS DISEASE - no changes in mental status - PT/OT evaluation while in hospital with recommendation to be discharged back to SNF  ANXIETY - continue alprazolam on discharge (home medication)  DISPOSITION - To skilled nursing facility  Discharge Diagnoses:    Present on Admission:  .VENOUS INSUFFICIENCY .ANXIETY .ALZHEIMERS DISEASE .HYPERTENSION .GERD .CONSTIPATION .UTI (lower urinary tract infection)  Principal Problem:  *UTI (lower urinary tract infection) Active Problems:  HYPERTENSION  VENOUS INSUFFICIENCY  ANXIETY  ALZHEIMERS DISEASE  GERD  CONSTIPATION   Medication List  As of 07/19/2011 12:42 PM   TAKE these medications         acetaminophen 500 MG tablet   Commonly known as: TYLENOL   Take 500 mg by mouth every 6 (six) hours as needed. For pain      ALPRAZolam 0.25 MG tablet   Commonly known as: XANAX   Take 1 tablet (0.25 mg total) by mouth 3 (three) times daily as needed for sleep.      CENTRUM tablet   Take 1 tablet by mouth daily.      ciprofloxacin 500 MG tablet   Commonly known as: CIPRO   Take 1 tablet (500 mg total) by mouth 2 (two) times daily.      citalopram 20 MG tablet   Commonly known as: CELEXA   take 1 tablet by mouth daily for anxiety      felodipine 5 MG 24 hr tablet   Commonly known as: PLENDIL   take 1 tablet by mouth once daily      guaiFENesin-dextromethorphan 100-10  MG/5ML syrup   Commonly known as: ROBITUSSIN DM   Take 5 mLs by mouth every 4 (four) hours as needed for cough.      haloperidol 1 MG tablet   Commonly known as: HALDOL   Take 1 tablet (1 mg total) by mouth every 6 (six) hours as needed.            Disposition and Follow-up: - patient is medically stable and clinically appears well to be discharged to SNF - follow up with PCP in 1 week prior to discharge  Consults:   1. Physical Therapy  Significant Diagnostic Studies:   Dg Chest 2 View 07/15/2011  IMPRESSION:  1.  Minimal bibasilar airspace disease likely reflects atelectasis. 2.  The hiatal hernia is no longer visible. 3.  Dilated loops of bowel are present just under the diaphragm.    US Renal 07/16/2011   IMPRESSION:  1.  Nonobstructing right interpolar renal collecting system calculus. 2.  Left interpolar 3 cm renal cyst. 3.  Small postvoid residual of 28 ml.    Brief H and P: Patient is a  78 y.o. male, with history of dementia. From SNF  who has been admitted with complaints of burning sensation on urination associated with foul-smelling urine, low-grade fevers and generalized weakness. The signs and symptoms started 1 day prior to admission. The symptoms  were associated with intermittent suprapubic pain, cramp like quality,  4-5/10 in intensity, non radiating. There was no associated blood in urine, no nausea or vomiting. As per patient the generalized weakness was getting progressively worse over a course of few days prior to admission as he was unable to ambulate even from bed to chair. There was no complaints of chest pain, no shortness of breath, no palpitations, no lightheadedness or loss of consciousness.   Physical Exam on Discharge:  Filed Vitals:   07/17/11 1735 07/18/11 0500 07/18/11 2153 07/19/11 0716  BP: 133/75 137/89 129/75 136/75  Pulse: 72 88 68 66  Temp: 98.1 F (36.7 C) 97.1 F (36.2 C) 97 F (36.1 C) 97.1 F (36.2 C)  TempSrc: Oral Oral Oral Oral    Resp: 20 19 20 18   Height:      Weight:  83.462 kg (184 lb)  84.2 kg (185 lb 10 oz)  SpO2: 95% 94% 96% 93%     Intake/Output Summary (Last 24 hours) at 07/19/11 1242 Last data filed at 07/19/11 1039  Gross per 24 hour  Intake     60 ml  Output    200 ml  Net   -140 ml    General:  no acute distress. HEENT: No bruits, no goiter. Heart: Regular rate and rhythm, without murmurs, rubs, gallops. Lungs: Clear to auscultation bilaterally; bilateral air entry; no wheezing Abdomen: Soft, nontender, nondistended, positive bowel sounds. Extremities: No clubbing cyanosis, trace LE edema with positive pedal pulses. Neuro: Grossly intact, nonfocal.  CBC:    Component Value Date/Time   WBC 9.4 07/16/2011 0500   HGB 13.0 07/16/2011 0500   HCT 38.4* 07/16/2011 0500   PLT 135* 07/16/2011 0500   MCV 96.7 07/16/2011 0500   NEUTROABS 7.8* 07/15/2011 1844   LYMPHSABS 0.2* 07/15/2011 1844   MONOABS 0.3 07/15/2011 1844   EOSABS 0.0 07/15/2011 1844   BASOSABS 0.0 07/15/2011 1844    Basic Metabolic Panel:    Component Value Date/Time   NA 139 07/16/2011 0500   K 5.0 07/16/2011 0500   CL 107 07/16/2011 0500   CO2 22 07/16/2011 0500   BUN 11 07/16/2011 0500   CREATININE 0.58 07/16/2011 0500   GLUCOSE 94 07/16/2011 0500   CALCIUM 9.3 07/16/2011 0500    Time spent on Discharge: Greater than 30 minutes  Signed: Manson Passey 07/19/2011, 12:42 PM Pager [phone]

## 2011-07-19 NOTE — Progress Notes (Signed)
Clinical Child psychotherapist (CSW) informed by [facility] skilled nursing facility is able to offer placement today, while understanding pt sitter was dc'ed this morning. Facility stated that as long as pt is calm now it is fine for him

## 2011-07-20 DIAGNOSIS — G309 Alzheimer's disease, unspecified: Secondary | ICD-10-CM

## 2011-07-20 DIAGNOSIS — F0280 Dementia in other diseases classified elsewhere without behavioral disturbance: Secondary | ICD-10-CM | POA: Diagnosis not present

## 2011-07-20 DIAGNOSIS — F411 Generalized anxiety disorder: Secondary | ICD-10-CM

## 2011-07-20 DIAGNOSIS — N390 Urinary tract infection, site not specified: Secondary | ICD-10-CM

## 2011-07-20 DIAGNOSIS — I10 Essential (primary) hypertension: Secondary | ICD-10-CM

## 2011-07-24 DIAGNOSIS — IMO0002 Reserved for concepts with insufficient information to code with codable children: Secondary | ICD-10-CM

## 2011-08-14 ENCOUNTER — Other Ambulatory Visit: Payer: Self-pay | Admitting: Internal Medicine

## 2011-08-14 ENCOUNTER — Ambulatory Visit: Payer: Medicare Other | Admitting: Internal Medicine

## 2011-08-14 NOTE — Telephone Encounter (Signed)
rx called into pharmacy

## 2011-08-14 NOTE — Telephone Encounter (Signed)
Okay #90 x 0 

## 2011-08-16 ENCOUNTER — Ambulatory Visit (INDEPENDENT_AMBULATORY_CARE_PROVIDER_SITE_OTHER): Payer: Medicare Other | Admitting: Internal Medicine

## 2011-08-16 ENCOUNTER — Encounter: Payer: Self-pay | Admitting: Internal Medicine

## 2011-08-16 VITALS — BP 138/60 | HR 56 | Temp 97.8°F | Wt 197.0 lb

## 2011-08-16 DIAGNOSIS — R300 Dysuria: Secondary | ICD-10-CM

## 2011-08-16 DIAGNOSIS — G309 Alzheimer's disease, unspecified: Secondary | ICD-10-CM

## 2011-08-16 DIAGNOSIS — F411 Generalized anxiety disorder: Secondary | ICD-10-CM

## 2011-08-16 DIAGNOSIS — N390 Urinary tract infection, site not specified: Secondary | ICD-10-CM

## 2011-08-16 DIAGNOSIS — F0280 Dementia in other diseases classified elsewhere without behavioral disturbance: Secondary | ICD-10-CM

## 2011-08-16 DIAGNOSIS — I10 Essential (primary) hypertension: Secondary | ICD-10-CM

## 2011-08-16 LAB — POCT URINALYSIS DIPSTICK
Glucose, UA: NEGATIVE
Nitrite, UA: NEGATIVE
Urobilinogen, UA: 0.2

## 2011-08-16 NOTE — Assessment & Plan Note (Signed)
Ongoing Has had good response from xanax--needs to get at [facility] also

## 2011-08-16 NOTE — Assessment & Plan Note (Signed)
BP Readings from Last 3 Encounters:  08/16/11 138/60  07/19/11 140/81  07/03/11 120/60   Good control No changes needed

## 2011-08-16 NOTE — Progress Notes (Signed)
Subjective:    Patient ID: [name] [name], male    DOB: 1926-06-20, 78 y.o.   MRN: [id number]  HPI Back home after hospitalization for change in status Had UTI Rehab at [facility] for 1-2 weeks  Back home  Same problems with care No help  He has urinary frequency still  Mild left flank discomfort occ urinary incontinence--- mild at this point  [place] be going back [place] Back to twice a week soon  No elopement issues--stays in house. Goes with wife for shopping, etc---and stays in car Ongoing anxiety--gets the xanax tid No agitation or safety issues for wife  Wife has to get shower ready then he does it She helps with clothes Goes to bathroom by himself  Current Outpatient Prescriptions on File Prior to Visit  Medication Sig Dispense Refill  . acetaminophen (TYLENOL) 500 MG tablet Take 500 mg by mouth every 6 (six) hours as needed. For pain      . ALPRAZolam (XANAX) 0.25 MG tablet take 1 tablet by mouth three times a day if needed  90 tablet  0  . citalopram (CELEXA) 20 MG tablet take 1 tablet by mouth daily for anxiety  30 tablet  11  . felodipine (PLENDIL) 5 MG 24 hr tablet take 1 tablet by mouth once daily  30 tablet  12  . Multiple Vitamins-Minerals (CENTRUM) tablet Take 1 tablet by mouth daily.          No Known Allergies  Past Medical History  Diagnosis Date  . GERD (gastroesophageal reflux disease)   . Hypertension   . Anxiety   . Alzheimer disease   . DIVERTICULOSIS, COLON, HX OF 09/19/2006  . ARTHRITIS 09/19/2006  . VENOUS INSUFFICIENCY 09/19/2006  . HYPERTENSION 09/19/2006  . ALZHEIMERS DISEASE 09/25/2008  . B12 DEFICIENCY 11/23/2008    Past Surgical History  Procedure Date  . Appendectomy 1980  . Eye surgery 2005    laser eye  . Back surgery 1982, 1992  . Shoulder surgery 2004    right  . Cataract extraction, bilateral 2004    Family History  Problem Relation Age of Onset  . Asthma Mother   . Alzheimer's disease Mother   . Kidney disease  Father   . COPD Brother   . Alzheimer's disease Brother   . Arthritis Brother     History   Social History  . Marital Status: Married    Spouse Name: N/A    Number of Children: 2  . Years of Education: N/A   Occupational History  . grading work    Social History Main Topics  . Smoking status: Former Smoker -- 50 years    Types: Cigarettes  . Smokeless tobacco: Never Used  . Alcohol Use: No  . Drug Use: Not on file  . Sexually Active: Not on file   Other Topics Concern  . Not on file   Social History Narrative   No living will, would accept resuscitation ( discussion 11/06)   Review of Systems Sleeps well for the most part--has xanax in evening which may help Appetite is fine Weight is stable    Objective:   Physical Exam  Constitutional: He appears well-developed and well-nourished. No distress.  Neck: Normal range of motion. Neck supple.  Cardiovascular: Normal rate, regular rhythm and normal heart sounds.  Exam reveals no gallop.   No murmur heard. Pulmonary/Chest: Effort normal and breath sounds normal. No respiratory distress. He has no wheezes. He has no rales.  Abdominal: Soft. There is no tenderness.  Musculoskeletal:       No CVA tenderness  Lymphadenopathy:    He has no cervical adenopathy.  Neurological:       Engages but confused   Psychiatric: He has a normal mood and affect.       Upbeat and cooperative          Assessment & Plan:

## 2011-08-16 NOTE — Assessment & Plan Note (Signed)
Mild Wife having significant caregiver stress Needs to get back to [facility] May want to hire help in AM a couple of times a week

## 2011-08-16 NOTE — Assessment & Plan Note (Signed)
Was hospitalized for systemic symptoms and needed some rehab Back home and doing better now Urinalysis normal now

## 2011-08-22 ENCOUNTER — Ambulatory Visit: Payer: Medicare Other | Admitting: Internal Medicine

## 2011-09-22 ENCOUNTER — Other Ambulatory Visit: Payer: Self-pay | Admitting: Internal Medicine

## 2011-09-22 NOTE — Telephone Encounter (Signed)
rx called into pharmacy

## 2011-09-22 NOTE — Telephone Encounter (Signed)
Okay #90 x 1 

## 2011-10-30 ENCOUNTER — Ambulatory Visit: Payer: Medicare Other | Admitting: Internal Medicine

## 2011-10-31 ENCOUNTER — Ambulatory Visit (INDEPENDENT_AMBULATORY_CARE_PROVIDER_SITE_OTHER): Payer: Medicare Other | Admitting: Internal Medicine

## 2011-10-31 ENCOUNTER — Encounter: Payer: Self-pay | Admitting: Internal Medicine

## 2011-10-31 DIAGNOSIS — L02619 Cutaneous abscess of unspecified foot: Secondary | ICD-10-CM

## 2011-10-31 DIAGNOSIS — L03039 Cellulitis of unspecified toe: Secondary | ICD-10-CM | POA: Diagnosis not present

## 2011-10-31 DIAGNOSIS — L03031 Cellulitis of right toe: Secondary | ICD-10-CM | POA: Insufficient documentation

## 2011-10-31 MED ORDER — CEPHALEXIN 500 MG PO TABS: 500.0000 mg | ORAL_TABLET | Freq: Two times a day (BID) | ORAL | Status: DC

## 2011-10-31 NOTE — Progress Notes (Signed)
  Subjective:    Patient ID: [name] [name], male    DOB: 13-Sep-1926, 78 y.o.   MRN: [id number]  HPI Here with wife Apparently cut his right great toe on recliner in early morning 2 days ago He doesn't remember what happened  Lots of bleeding which has stopped Wife using neosporin  Now has increased redness No sig pain at rest or walking---is sensitive  Current Outpatient Prescriptions on File Prior to Visit  Medication Sig Dispense Refill  . acetaminophen (TYLENOL) 500 MG tablet Take 500 mg by mouth every 6 (six) hours as needed. For pain      . ALPRAZolam (XANAX) 0.25 MG tablet take 1 tablet by mouth three times a day if needed  90 tablet  1  . citalopram (CELEXA) 20 MG tablet take 1 tablet by mouth daily for anxiety  30 tablet  11  . felodipine (PLENDIL) 5 MG 24 hr tablet take 1 tablet by mouth once daily  30 tablet  12  . Multiple Vitamins-Minerals (CENTRUM) tablet Take 1 tablet by mouth daily.          No Known Allergies  Past Medical History  Diagnosis Date  . GERD (gastroesophageal reflux disease)   . Hypertension   . Anxiety   . Alzheimer disease   . DIVERTICULOSIS, COLON, HX OF 09/19/2006  . ARTHRITIS 09/19/2006  . VENOUS INSUFFICIENCY 09/19/2006  . HYPERTENSION 09/19/2006  . ALZHEIMERS DISEASE 09/25/2008  . B12 DEFICIENCY 11/23/2008    Past Surgical History  Procedure Date  . Appendectomy 1980  . Eye surgery 2005    laser eye  . Back surgery 1982, 1992  . Shoulder surgery 2004    right  . Cataract extraction, bilateral 2004    Family History  Problem Relation Age of Onset  . Asthma Mother   . Alzheimer's disease Mother   . Kidney disease Father   . COPD Brother   . Alzheimer's disease Brother   . Arthritis Brother     History   Social History  . Marital Status: Married    Spouse Name: N/A    Number of Children: 2  . Years of Education: N/A   Occupational History  . grading work    Social History Main Topics  . Smoking status: Former Smoker --  50 years    Types: Cigarettes  . Smokeless tobacco: Never Used  . Alcohol Use: No  . Drug Use: Not on file  . Sexually Active: Not on file   Other Topics Concern  . Not on file   Social History Narrative   No living will, would accept resuscitation ( discussion 11/06)   Review of Systems No fever No nausea or vomiting    Objective:   Physical Exam  Constitutional: He appears well-developed and well-nourished. No distress.  Skin:       Horizontal laceration across nail bed on right great toe Sig gapping still with pressure (2-34mm) Some deep red discoloration, warmth and mild tenderness proximal to the wound          Assessment & Plan:

## 2011-10-31 NOTE — Assessment & Plan Note (Signed)
Complicating laceration Wound closed with steristrips to enhance healing  Will treat with keflex

## 2011-11-07 ENCOUNTER — Ambulatory Visit (INDEPENDENT_AMBULATORY_CARE_PROVIDER_SITE_OTHER): Payer: Medicare Other | Admitting: Family Medicine

## 2011-11-07 ENCOUNTER — Encounter: Payer: Self-pay | Admitting: Family Medicine

## 2011-11-07 DIAGNOSIS — L03039 Cellulitis of unspecified toe: Secondary | ICD-10-CM | POA: Diagnosis not present

## 2011-11-07 DIAGNOSIS — L02619 Cutaneous abscess of unspecified foot: Secondary | ICD-10-CM

## 2011-11-07 MED ORDER — DOXYCYCLINE HYCLATE 100 MG PO CAPS: 100.0000 mg | ORAL_CAPSULE | Freq: Two times a day (BID) | ORAL | Status: AC

## 2011-11-07 NOTE — Assessment & Plan Note (Signed)
After laceration presumed on recliner.  Given h/o dementia, some trouble with history. Was on keflex 500mg  bid, but normal kidney function.  Will increase to 500mg  qid as well as add on doxycycline. Will refer to podiatry as may need nail removal or replacement.

## 2011-11-07 NOTE — Progress Notes (Signed)
  Subjective:    Patient ID: [name] [name], male    DOB: [birth year], 78 y.o.   MRN: [id number]  HPI CC: recheck toe  Presents with wife.  See Dr. Cain Saupe prior note for details.  In short, seen 5/21 for stubbed toe (cut toe on recliner 5/19), dx with complicated laceration, edges approximated with steristrips and placed on keflex 500mg  bid x 7 days.   Actually worse.  Worsened erythema, swelling.  Not really tender.  Denies foot numbness.  Denies fevers/chills, nausea, vomiting.  No h/o DM.  H/o dementia.  Past Medical History  Diagnosis Date  . GERD (gastroesophageal reflux disease)   . Hypertension   . Anxiety   . Alzheimer disease   . DIVERTICULOSIS, COLON, HX OF 09/19/2006  . ARTHRITIS 09/19/2006  . VENOUS INSUFFICIENCY 09/19/2006  . HYPERTENSION 09/19/2006  . ALZHEIMERS DISEASE 09/25/2008  . B12 DEFICIENCY 11/23/2008    Review of Systems Per HPI    Objective:   Physical Exam  Nursing note and vitals reviewed. Constitutional: He appears well-developed and well-nourished. No distress.  Cardiovascular:  Pulses:      Dorsalis pedis pulses are 2+ on the right side.       Posterior tibial pulses are 2+ on the right side.  Skin:       Horizontal laceration at base of nail across nailbed proximal to cuticle. nailroot seems to be coming out of toe. Edema of great toe as well as erythema present extending about 2cm distal to nailbed. Sensation intact      Assessment & Plan:

## 2011-11-07 NOTE — Patient Instructions (Signed)
Add on doxycycline twice daily for 10 days.  Continue keflex. Pass by [facility]'s office for referral to podiatrist Please let us know if redness spreading or any worsening.

## 2011-11-10 DIAGNOSIS — L03039 Cellulitis of unspecified toe: Secondary | ICD-10-CM | POA: Diagnosis not present

## 2011-11-10 DIAGNOSIS — L02619 Cutaneous abscess of unspecified foot: Secondary | ICD-10-CM | POA: Diagnosis not present

## 2011-11-14 DIAGNOSIS — L03039 Cellulitis of unspecified toe: Secondary | ICD-10-CM | POA: Diagnosis not present

## 2011-11-16 ENCOUNTER — Ambulatory Visit: Payer: Medicare Other | Admitting: Internal Medicine

## 2011-11-20 ENCOUNTER — Other Ambulatory Visit: Payer: Self-pay | Admitting: Internal Medicine

## 2011-11-27 ENCOUNTER — Other Ambulatory Visit: Payer: Self-pay | Admitting: Internal Medicine

## 2011-11-27 NOTE — Telephone Encounter (Signed)
rx called into pharmacy

## 2011-11-27 NOTE — Telephone Encounter (Signed)
Okay #90 x 1 

## 2012-01-16 ENCOUNTER — Ambulatory Visit: Payer: Medicare Other | Admitting: Internal Medicine

## 2012-01-25 ENCOUNTER — Encounter: Payer: Self-pay | Admitting: Internal Medicine

## 2012-01-25 ENCOUNTER — Ambulatory Visit (INDEPENDENT_AMBULATORY_CARE_PROVIDER_SITE_OTHER): Payer: Medicare Other | Admitting: Internal Medicine

## 2012-01-25 VITALS — BP 118/70 | HR 65 | Temp 97.7°F | Wt 210.0 lb

## 2012-01-25 DIAGNOSIS — I10 Essential (primary) hypertension: Secondary | ICD-10-CM | POA: Diagnosis not present

## 2012-01-25 DIAGNOSIS — M159 Polyosteoarthritis, unspecified: Secondary | ICD-10-CM | POA: Diagnosis not present

## 2012-01-25 DIAGNOSIS — F0280 Dementia in other diseases classified elsewhere without behavioral disturbance: Secondary | ICD-10-CM

## 2012-01-25 DIAGNOSIS — F411 Generalized anxiety disorder: Secondary | ICD-10-CM | POA: Diagnosis not present

## 2012-01-25 DIAGNOSIS — G309 Alzheimer's disease, unspecified: Secondary | ICD-10-CM

## 2012-01-25 MED ORDER — HYDROCODONE-ACETAMINOPHEN 5-325 MG PO TABS: 0.5000 | ORAL_TABLET | Freq: Three times a day (TID) | ORAL | Status: AC | PRN

## 2012-01-25 NOTE — Assessment & Plan Note (Signed)
Still in mild range Wife still managing at home May need to consider in home aide help before too long

## 2012-01-25 NOTE — Progress Notes (Signed)
Subjective:    Patient ID: [name] [name], male    DOB: [birth year], 78 y.o.   MRN: [id number]  HPI Here wit wife Toe did finally heal up Podiatrist did take nail off and cut did heal up   Having trouble getting up and down from chair Having pain in knees and calves some pain while in bed Seems to be okay with walking Uses at least 4 tylenol per day--does seem to help (500mg ) Very slight edema---sock marks only  No chest pain No SOB No dizziness or syncope  Showers with standby assistance---wife has to turn it on and prompt him Wife helps him dress Shaves and brushes teeth with set up and stand by assist by wife Uses the bathroom himself mostly---occ urinary incontinence (occ goes on floor) [place] goes [place] 2 days per week  Current Outpatient Prescriptions on File Prior to Visit  Medication Sig Dispense Refill  . acetaminophen (TYLENOL) 500 MG tablet Take 500 mg by mouth every 6 (six) hours as needed. For pain      . ALPRAZolam (XANAX) 0.25 MG tablet Take 1 tablet (0.25 mg total) by mouth 3 (three) times daily as needed.  90 tablet  1  . citalopram (CELEXA) 20 MG tablet take 1 tablet by mouth daily for anxiety  30 tablet  11  . felodipine (PLENDIL) 5 MG 24 hr tablet take 1 tablet by mouth once daily  30 tablet  12  . Multiple Vitamins-Minerals (CENTRUM) tablet Take 1 tablet by mouth daily.          No Known Allergies  Past Medical History  Diagnosis Date  . GERD (gastroesophageal reflux disease)   . Hypertension   . Anxiety   . Alzheimer disease   . DIVERTICULOSIS, COLON, HX OF 09/19/2006  . ARTHRITIS 09/19/2006  . VENOUS INSUFFICIENCY 09/19/2006  . HYPERTENSION 09/19/2006  . ALZHEIMERS DISEASE 09/25/2008  . B12 DEFICIENCY 11/23/2008    Past Surgical History  Procedure Date  . Appendectomy 1980  . Eye surgery 2005    laser eye  . Back surgery 1982, 1992  . Shoulder surgery 2004    right  . Cataract extraction, bilateral 2004    Family History  Problem  Relation Age of Onset  . Asthma Mother   . Alzheimer's disease Mother   . Kidney disease Father   . COPD Brother   . Alzheimer's disease Brother   . Arthritis Brother     History   Social History  . Marital Status: Married    Spouse Name: N/A    Number of Children: 2  . Years of Education: N/A   Occupational History  . grading work    Social History Main Topics  . Smoking status: Former Smoker -- 50 years    Types: Cigarettes  . Smokeless tobacco: Never Used  . Alcohol Use: No  . Drug Use: Not on file  . Sexually Active: Not on file   Other Topics Concern  . Not on file   Social History Narrative   No living will, would accept resuscitation ( discussion 11/06)   Review of Systems Appetite is good Weight stable Sleeps okay    Objective:   Physical Exam  Constitutional: He appears well-developed and well-nourished. No distress.  Neck: Normal range of motion. Neck supple.  Cardiovascular: Normal rate, regular rhythm and normal heart sounds.  Exam reveals no gallop.   No murmur heard. Pulmonary/Chest: Effort normal and breath sounds normal. No respiratory distress. He has  no wheezes. He has no rales.  Musculoskeletal: He exhibits no edema and no tenderness.       No sig effusions in knees No sig crepitus No calf tenderness  Lymphadenopathy:    He has no cervical adenopathy.  Psychiatric:       Alert and cooperative          Assessment & Plan:

## 2012-01-25 NOTE — Assessment & Plan Note (Signed)
Seems to be doing okay Still needs the xanax bid Will continue the citalopram also

## 2012-01-25 NOTE — Assessment & Plan Note (Signed)
BP Readings from Last 3 Encounters:  01/25/12 118/70  11/07/11 110/68  10/31/11 120/68   At goal on meds No changes needed

## 2012-01-25 NOTE — Assessment & Plan Note (Signed)
Mostly knees More pain lately Will have wife give tylenol regularly and have norco for prn

## 2012-02-26 ENCOUNTER — Other Ambulatory Visit: Payer: Self-pay | Admitting: Internal Medicine

## 2012-02-26 NOTE — Telephone Encounter (Signed)
Okay #90 x 1 

## 2012-02-26 NOTE — Telephone Encounter (Signed)
rx called into pharmacy

## 2012-03-14 ENCOUNTER — Ambulatory Visit (INDEPENDENT_AMBULATORY_CARE_PROVIDER_SITE_OTHER): Payer: Medicare Other

## 2012-03-14 DIAGNOSIS — Z23 Encounter for immunization: Secondary | ICD-10-CM | POA: Diagnosis not present

## 2012-04-08 ENCOUNTER — Other Ambulatory Visit: Payer: Self-pay | Admitting: Internal Medicine

## 2012-05-23 ENCOUNTER — Other Ambulatory Visit: Payer: Self-pay | Admitting: Internal Medicine

## 2012-05-23 NOTE — Telephone Encounter (Signed)
Okay #90 x 0 

## 2012-05-24 NOTE — Telephone Encounter (Signed)
rx called into pharmacy

## 2012-06-17 DIAGNOSIS — M86179 Other acute osteomyelitis, unspecified ankle and foot: Secondary | ICD-10-CM | POA: Diagnosis not present

## 2012-06-19 DIAGNOSIS — S92919B Unspecified fracture of unspecified toe(s), initial encounter for open fracture: Secondary | ICD-10-CM | POA: Diagnosis not present

## 2012-06-19 DIAGNOSIS — M129 Arthropathy, unspecified: Secondary | ICD-10-CM | POA: Diagnosis not present

## 2012-06-19 DIAGNOSIS — L03039 Cellulitis of unspecified toe: Secondary | ICD-10-CM | POA: Diagnosis not present

## 2012-06-19 DIAGNOSIS — L02619 Cutaneous abscess of unspecified foot: Secondary | ICD-10-CM | POA: Diagnosis not present

## 2012-06-19 DIAGNOSIS — S92909A Unspecified fracture of unspecified foot, initial encounter for closed fracture: Secondary | ICD-10-CM | POA: Diagnosis not present

## 2012-06-19 DIAGNOSIS — B470 Eumycetoma: Secondary | ICD-10-CM | POA: Diagnosis not present

## 2012-06-24 DIAGNOSIS — F0390 Unspecified dementia without behavioral disturbance: Secondary | ICD-10-CM | POA: Diagnosis not present

## 2012-06-24 DIAGNOSIS — Z4889 Encounter for other specified surgical aftercare: Secondary | ICD-10-CM | POA: Diagnosis not present

## 2012-06-24 DIAGNOSIS — I10 Essential (primary) hypertension: Secondary | ICD-10-CM | POA: Diagnosis not present

## 2012-06-27 DIAGNOSIS — M86179 Other acute osteomyelitis, unspecified ankle and foot: Secondary | ICD-10-CM | POA: Diagnosis not present

## 2012-07-01 DIAGNOSIS — I10 Essential (primary) hypertension: Secondary | ICD-10-CM | POA: Diagnosis not present

## 2012-07-01 DIAGNOSIS — Z4889 Encounter for other specified surgical aftercare: Secondary | ICD-10-CM | POA: Diagnosis not present

## 2012-07-01 DIAGNOSIS — F0390 Unspecified dementia without behavioral disturbance: Secondary | ICD-10-CM | POA: Diagnosis not present

## 2012-07-02 DIAGNOSIS — F0390 Unspecified dementia without behavioral disturbance: Secondary | ICD-10-CM | POA: Diagnosis not present

## 2012-07-02 DIAGNOSIS — Z4889 Encounter for other specified surgical aftercare: Secondary | ICD-10-CM | POA: Diagnosis not present

## 2012-07-02 DIAGNOSIS — I10 Essential (primary) hypertension: Secondary | ICD-10-CM | POA: Diagnosis not present

## 2012-07-18 ENCOUNTER — Other Ambulatory Visit: Payer: Self-pay | Admitting: Internal Medicine

## 2012-07-18 DIAGNOSIS — B470 Eumycetoma: Secondary | ICD-10-CM | POA: Diagnosis not present

## 2012-07-18 NOTE — Telephone Encounter (Signed)
Okay #90 x 0 

## 2012-07-19 NOTE — Telephone Encounter (Signed)
rx called into pharmacy

## 2012-07-29 ENCOUNTER — Ambulatory Visit (INDEPENDENT_AMBULATORY_CARE_PROVIDER_SITE_OTHER): Payer: Medicare Other | Admitting: Internal Medicine

## 2012-07-29 ENCOUNTER — Encounter: Payer: Self-pay | Admitting: Internal Medicine

## 2012-07-29 VITALS — BP 140/70 | HR 71 | Temp 97.9°F | Wt 207.0 lb

## 2012-07-29 DIAGNOSIS — G309 Alzheimer's disease, unspecified: Secondary | ICD-10-CM | POA: Diagnosis not present

## 2012-07-29 DIAGNOSIS — F0280 Dementia in other diseases classified elsewhere without behavioral disturbance: Secondary | ICD-10-CM | POA: Diagnosis not present

## 2012-07-29 DIAGNOSIS — Z1331 Encounter for screening for depression: Secondary | ICD-10-CM

## 2012-07-29 DIAGNOSIS — M159 Polyosteoarthritis, unspecified: Secondary | ICD-10-CM | POA: Diagnosis not present

## 2012-07-29 DIAGNOSIS — I10 Essential (primary) hypertension: Secondary | ICD-10-CM | POA: Diagnosis not present

## 2012-07-29 DIAGNOSIS — F411 Generalized anxiety disorder: Secondary | ICD-10-CM

## 2012-07-29 NOTE — Assessment & Plan Note (Signed)
Ongoing symptoms that are generally self limited Does use the tylenol regularly

## 2012-07-29 NOTE — Progress Notes (Signed)
Subjective:    Patient ID: [name] [name], male    DOB: September 12, 1926, 79 y.o.   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 acetaminophen (TYLENOL) 500 MG tablet Take 500-1,000 mg by mouth 3 (three) times daily as needed. For pain      . ALPRAZolam (XANAX) 0.25 MG tablet Take 1 tablet (0.25 mg total) by mouth 3 (three) times daily as needed.  90 tablet  0  . citalopram (CELEXA) 20 MG tablet take 1 tablet by mouth daily for anxiety  30 tablet  11  . felodipine (PLENDIL) 5 MG 24 hr tablet take 1 tablet by mouth once daily  30 tablet  12  . Multiple Vitamins-Minerals (CENTRUM) tablet Take 1 tablet by mouth daily.         No current facility-administered medications on file prior to visit.    No Known Allergies  Past Medical History  Diagnosis Date  . GERD (gastroesophageal reflux disease)   . Hypertension   . Anxiety   . Alzheimer disease   . DIVERTICULOSIS, COLON, HX OF 09/19/2006  . ARTHRITIS 09/19/2006  . VENOUS INSUFFICIENCY 09/19/2006  . HYPERTENSION 09/19/2006  . ALZHEIMERS DISEASE 09/25/2008  . B12 DEFICIENCY 11/23/2008     Past Surgical History  Procedure Laterality Date  . Appendectomy  1980  . Eye surgery  2005    laser eye  . Back surgery  1982, 1992  . Shoulder surgery  2004    right  . Cataract extraction, bilateral  2004    Family History  Problem Relation Age of Onset  . Asthma Mother   . Alzheimer's disease Mother   . Kidney disease Father   . COPD Brother   . Alzheimer's disease Brother   . Arthritis Brother     History   Social History  . Marital Status: Married    Spouse Name: N/A    Number of Children: 2  . Years of Education: N/A   Occupational History  . grading work    Social History Main Topics  . Smoking status: Former Smoker -- 50 years    Types: Cigarettes  . Smokeless tobacco: Never Used  . Alcohol Use: No  . Drug Use: Not on file  . Sexually Active: Not on file   Other Topics Concern  . Not on file   Social History Narrative   No living will, would accept resuscitation ( discussion 11/06)   Review of Systems Sleeps okay in general--occ up at night Weight down 3#    Objective:   Physical Exam  Constitutional: He appears well-developed and well-nourished. No distress.  Neck: Normal range of motion. Neck supple. No thyromegaly present.  Cardiovascular: Normal rate, regular rhythm and normal heart sounds.  Exam reveals no gallop.   No murmur heard. Pulmonary/Chest: Effort normal and breath sounds normal. No respiratory distress. He has no wheezes. He has no rales.  Musculoskeletal: He exhibits no edema and no tenderness.  Tip of right great toe amputated---healed well  Lymphadenopathy:    He has no cervical adenopathy.  Neurological:  Engages but illogical speech at times Can stay on task if simple questions  Psychiatric: He has a normal mood and affect. His behavior is normal.          Assessment & Plan:

## 2012-07-29 NOTE — Assessment & Plan Note (Signed)
Slow progression No longer will leave him Daughter and DIL's mother will help for respite May need to consider paid help soon

## 2012-07-29 NOTE — Assessment & Plan Note (Signed)
BP Readings from Last 3 Encounters:  07/29/12 140/70  01/25/12 118/70  11/07/11 110/68   Doing okay with med Due for labs

## 2012-07-29 NOTE — Assessment & Plan Note (Signed)
Has intermittent spells and the xanax will help Seems mostly related to dementia and forgetting where he is, etc

## 2012-07-30 LAB — CBC WITH DIFFERENTIAL/PLATELET
Basophils Absolute: 0.1 K/uL (ref 0.0–0.1)
Basophils Relative: 1.2 % (ref 0.0–3.0)
Eosinophils Absolute: 0.2 K/uL (ref 0.0–0.7)
Eosinophils Relative: 4.2 % (ref 0.0–5.0)
HCT: 41.4 % (ref 39.0–52.0)
Hemoglobin: 14.1 g/dL (ref 13.0–17.0)
Lymphocytes Relative: 26.5 % (ref 12.0–46.0)
Lymphs Abs: 1.4 K/uL (ref 0.7–4.0)
MCHC: 34.1 g/dL (ref 30.0–36.0)
MCV: 95.8 fl (ref 78.0–100.0)
Monocytes Absolute: 0.4 K/uL (ref 0.1–1.0)
Monocytes Relative: 8.1 % (ref 3.0–12.0)
Neutro Abs: 3.1 K/uL (ref 1.4–7.7)
Neutrophils Relative %: 60 % (ref 43.0–77.0)
Platelets: 234 K/uL (ref 150.0–400.0)
RBC: 4.32 Mil/uL (ref 4.22–5.81)
RDW: 13.2 % (ref 11.5–14.6)
WBC: 5.1 K/uL (ref 4.5–10.5)

## 2012-07-30 LAB — HEPATIC FUNCTION PANEL
ALT: 11 U/L (ref 0–53)
AST: 17 U/L (ref 0–37)
Bilirubin, Direct: 0.2 mg/dL (ref 0.0–0.3)
Total Bilirubin: 0.8 mg/dL (ref 0.3–1.2)

## 2012-07-30 LAB — BASIC METABOLIC PANEL
Calcium: 9 mg/dL (ref 8.4–10.5)
Chloride: 110 mEq/L (ref 96–112)
Creatinine, Ser: 0.8 mg/dL (ref 0.4–1.5)
Sodium: 142 mEq/L (ref 135–145)

## 2012-08-01 ENCOUNTER — Encounter: Payer: Self-pay | Admitting: *Deleted

## 2012-09-02 ENCOUNTER — Other Ambulatory Visit: Payer: Self-pay | Admitting: Internal Medicine

## 2012-09-02 NOTE — Telephone Encounter (Signed)
Okay #90 x 0 

## 2012-09-02 NOTE — Telephone Encounter (Signed)
rx called into pharmacy

## 2012-10-21 ENCOUNTER — Other Ambulatory Visit: Payer: Self-pay | Admitting: Internal Medicine

## 2012-10-21 NOTE — Telephone Encounter (Signed)
rx called into pharmacy

## 2012-10-21 NOTE — Telephone Encounter (Signed)
Okay #90 x 0 

## 2012-11-10 ENCOUNTER — Other Ambulatory Visit: Payer: Self-pay | Admitting: Internal Medicine

## 2012-12-08 ENCOUNTER — Other Ambulatory Visit: Payer: Self-pay | Admitting: Internal Medicine

## 2012-12-10 NOTE — Telephone Encounter (Signed)
Okay #90 x 0 

## 2012-12-10 NOTE — Telephone Encounter (Signed)
rx called into pharmacy

## 2013-01-12 ENCOUNTER — Emergency Department (HOSPITAL_COMMUNITY): Payer: Medicare Other

## 2013-01-12 ENCOUNTER — Emergency Department (HOSPITAL_COMMUNITY)
Admission: EM | Admit: 2013-01-12 | Discharge: 2013-01-12 | Disposition: A | Discharge status: Home / Self Care | Payer: Medicare Other | Attending: Emergency Medicine | Admitting: Emergency Medicine

## 2013-01-12 ENCOUNTER — Encounter (HOSPITAL_COMMUNITY): Payer: Self-pay | Admitting: *Deleted

## 2013-01-12 DIAGNOSIS — Z79899 Other long term (current) drug therapy: Secondary | ICD-10-CM | POA: Diagnosis not present

## 2013-01-12 DIAGNOSIS — S199XXA Unspecified injury of neck, initial encounter: Secondary | ICD-10-CM | POA: Diagnosis not present

## 2013-01-12 DIAGNOSIS — F0280 Dementia in other diseases classified elsewhere without behavioral disturbance: Secondary | ICD-10-CM | POA: Insufficient documentation

## 2013-01-12 DIAGNOSIS — I10 Essential (primary) hypertension: Secondary | ICD-10-CM | POA: Insufficient documentation

## 2013-01-12 DIAGNOSIS — G309 Alzheimer's disease, unspecified: Secondary | ICD-10-CM | POA: Insufficient documentation

## 2013-01-12 DIAGNOSIS — S0180XA Unspecified open wound of other part of head, initial encounter: Secondary | ICD-10-CM | POA: Diagnosis not present

## 2013-01-12 DIAGNOSIS — Z8719 Personal history of other diseases of the digestive system: Secondary | ICD-10-CM | POA: Insufficient documentation

## 2013-01-12 DIAGNOSIS — S0990XA Unspecified injury of head, initial encounter: Secondary | ICD-10-CM | POA: Diagnosis not present

## 2013-01-12 DIAGNOSIS — Y939 Activity, unspecified: Secondary | ICD-10-CM | POA: Insufficient documentation

## 2013-01-12 DIAGNOSIS — F411 Generalized anxiety disorder: Secondary | ICD-10-CM | POA: Insufficient documentation

## 2013-01-12 DIAGNOSIS — E538 Deficiency of other specified B group vitamins: Secondary | ICD-10-CM | POA: Diagnosis not present

## 2013-01-12 DIAGNOSIS — Z87891 Personal history of nicotine dependence: Secondary | ICD-10-CM | POA: Diagnosis not present

## 2013-01-12 DIAGNOSIS — S0181XA Laceration without foreign body of other part of head, initial encounter: Secondary | ICD-10-CM

## 2013-01-12 DIAGNOSIS — Z8739 Personal history of other diseases of the musculoskeletal system and connective tissue: Secondary | ICD-10-CM | POA: Insufficient documentation

## 2013-01-12 DIAGNOSIS — Y92009 Unspecified place in unspecified non-institutional (private) residence as the place of occurrence of the external cause: Secondary | ICD-10-CM | POA: Insufficient documentation

## 2013-01-12 DIAGNOSIS — Z8679 Personal history of other diseases of the circulatory system: Secondary | ICD-10-CM | POA: Diagnosis not present

## 2013-01-12 DIAGNOSIS — T1490XA Injury, unspecified, initial encounter: Secondary | ICD-10-CM | POA: Diagnosis not present

## 2013-01-12 DIAGNOSIS — R296 Repeated falls: Secondary | ICD-10-CM | POA: Insufficient documentation

## 2013-01-12 NOTE — ED Notes (Signed)
Pt. Has c/o witnessed fall while being assisted to the toilet. PT. has a laceration to the top left scalp and very small skin tears to the left finger, left wrist, and left elbow.  Pt.'s repetitive questioning is his baseline.

## 2013-01-12 NOTE — ED Provider Notes (Addendum)
I saw and evaluated the patient, reviewed the resident's note and I agree with the findings and plan.   .Face to face Exam:  General:  Awake HEENT: Laceration to forehead Resp:  Normal effort Abd:  Nondistended Neuro:No focal weakness  I was present and available for laceration repair.   Nelia Shi, MD 03/02/13 2204

## 2013-01-12 NOTE — ED Provider Notes (Signed)
CSN: [id number]     Arrival date & time 01/12/13  1405 History     First MD Initiated Contact with Patient 01/12/13 1409     Chief Complaint  Patient presents with  . Fall  . Facial Laceration   HPI 79 year old male with history of Alzheimer's disease presents following a fall this afternoon. Daughter reports that he fell around 1 pm this afternoon.  She was helping him after he finished using the restroom and he subsequently fell on the bathroom floor and hit his head.  He suffered a few skin tears on his left hand/arm and a laceration to his right forehead.  Daughter denies any changes in his mental status.  No associated nausea or vomiting following injury.  Currently, he reports a little pain at the site of his laceration.  No other complaints.  No chest pain, SOB, abdominal pain, headache.  Past Medical History  Diagnosis Date  . GERD (gastroesophageal reflux disease)   . Hypertension   . Anxiety   . Alzheimer disease   . DIVERTICULOSIS, COLON, HX OF 09/19/2006  . ARTHRITIS 09/19/2006  . VENOUS INSUFFICIENCY 09/19/2006  . HYPERTENSION 09/19/2006  . ALZHEIMERS DISEASE 09/25/2008  . B12 DEFICIENCY 11/23/2008   Past Surgical History  Procedure Laterality Date  . Appendectomy  1980  . Eye surgery  2005    laser eye  . Back surgery  1982, 1992  . Shoulder surgery  2004    right  . Cataract extraction, bilateral  2004   Family History  Problem Relation Age of Onset  . Asthma Mother   . Alzheimer's disease Mother   . Kidney disease Father   . COPD Brother   . Alzheimer's disease Brother   . Arthritis Brother    History  Substance Use Topics  . Smoking status: Former Smoker -- 50 years    Types: Cigarettes  . Smokeless tobacco: Never Used  . Alcohol Use: No    Review of Systems  Respiratory: Negative for shortness of breath.   Cardiovascular: Negative for chest pain.  Gastrointestinal: Negative for nausea, vomiting and abdominal pain.  Genitourinary: Negative for  difficulty urinating.  Neurological: Negative for dizziness and light-headedness.  Psychiatric/Behavioral:       Confusion and mental status at baseline per family.   Allergies  Review of patient's allergies indicates no known allergies.  Home Medications   Current Outpatient Rx  Name  Route  Sig  Dispense  Refill  . acetaminophen (TYLENOL) 500 MG tablet   Oral   Take 500-1,000 mg by mouth 3 (three) times daily as needed. For pain         . ALPRAZolam (XANAX) 0.25 MG tablet   Oral   Take 0.25 mg by mouth 3 (three) times daily as needed for sleep or anxiety.         . citalopram (CELEXA) 20 MG tablet   Oral   Take 20 mg by mouth daily.         . Cyanocobalamin (VITAMIN B-12 PO)   Oral   Take 1 tablet by mouth daily.         . felodipine (PLENDIL) 5 MG 24 hr tablet   Oral   Take 5 mg by mouth daily.          BP 107/54  Pulse 64  Temp(Src) 97.8 F (36.6 C) (Oral)  Resp 20  SpO2 99% Physical Exam  Constitutional: He appears well-developed and well-nourished. No distress.  HENT:  Head: Normocephalic.  Small, 2 cm laceration noted on right forehead.  Eyes: EOM are normal. Pupils are equal, round, and reactive to light. No scleral icterus.  Neck: Neck supple.  Cardiovascular: Normal rate and regular rhythm.   No murmur heard. Pulmonary/Chest: Breath sounds normal. No respiratory distress. He has no wheezes. He has no rales.  Abdominal: Soft. He exhibits no distension. There is no tenderness.  Musculoskeletal: He exhibits no edema.  Neurological: He is alert.  Orient to person, not place or time.  Per family this is his baseline neurological status.  Skin: Skin is warm and dry.   ED Course   Procedures (including critical care time) LACERATION REPAIR Performed by: Everlene Other Authorized by: Everlene Other Consent: Verbal consent obtained. Risks and benefits: risks, benefits and alternatives were discussed Consent given by: patient and Daughter Patient  identity confirmed: provided demographic data Prepped and Draped in normal sterile fashion Wound explored  Laceration Location: Right forehead  Laceration Length: 2 cm  No Foreign Bodies seen or palpated  Anesthesia: local infiltration  Local anesthetic: lidocaine 2%  Anesthetic total: 3 ml  Irrigation method: syringe Amount of cleaning: standard  Skin closure: 5.0 Proline   Number of sutures: 3  Technique: Skin closed in sterile fashion with 5.0 proline.   Patient tolerance: Patient tolerated the procedure well with no immediate complications.  Labs Reviewed - No data to display No results found. No diagnosis found.  MDM  79 year old male with Alzheimer's presents after a low impact fall at home today. - Obtaining head CT to rule out bleeding - Laceration repaired (see above).  1600 - CT head negative for acute findings.  Patient medically stable for discharge.  Will discharge home with close follow up with PCP   Tommie Sams, DO 01/12/13 1609

## 2013-01-16 ENCOUNTER — Ambulatory Visit (INDEPENDENT_AMBULATORY_CARE_PROVIDER_SITE_OTHER): Payer: Medicare Other | Admitting: Family Medicine

## 2013-01-16 ENCOUNTER — Encounter: Payer: Self-pay | Admitting: Family Medicine

## 2013-01-16 VITALS — BP 86/58 | HR 68 | Temp 97.6°F | Ht 68.0 in | Wt 197.0 lb

## 2013-01-16 DIAGNOSIS — I959 Hypotension, unspecified: Secondary | ICD-10-CM

## 2013-01-16 DIAGNOSIS — Z5189 Encounter for other specified aftercare: Secondary | ICD-10-CM

## 2013-01-16 DIAGNOSIS — I10 Essential (primary) hypertension: Secondary | ICD-10-CM | POA: Diagnosis not present

## 2013-01-16 DIAGNOSIS — R55 Syncope and collapse: Secondary | ICD-10-CM

## 2013-01-16 DIAGNOSIS — S0181XD Laceration without foreign body of other part of head, subsequent encounter: Secondary | ICD-10-CM

## 2013-01-16 DIAGNOSIS — S0181XA Laceration without foreign body of other part of head, initial encounter: Secondary | ICD-10-CM | POA: Insufficient documentation

## 2013-01-16 LAB — POCT URINALYSIS DIPSTICK
Ketones, UA: NEGATIVE
Leukocytes, UA: NEGATIVE
Nitrite, UA: NEGATIVE
Protein, UA: NEGATIVE
pH, UA: 6

## 2013-01-16 NOTE — Patient Instructions (Addendum)
Hold plendil for now. Follow BPs at home if able. Call if >140/90. Push fluids. Stop at lab on your way out. Keep follow up with Dr. Alphonsus Sias.

## 2013-01-16 NOTE — Assessment & Plan Note (Signed)
Likely contributed to ? Syncope and fall. Hold BP med. Follow Bps at home, follow up with [facility] as scheduled.

## 2013-01-16 NOTE — Assessment & Plan Note (Signed)
Sutures removed without complication. Well healing.

## 2013-01-16 NOTE — Assessment & Plan Note (Signed)
D/C plendil

## 2013-01-16 NOTE — Assessment & Plan Note (Addendum)
UNclear if syncope or not... Wife not sure, she saw blood and left to get help. Pt not talking when she got back, not moved, ? Eyes open or not. When EMS there pt talking.  No clear sign/symtpoms of infection. UA clear.  EKG unchanged from previous.  ? If due to low BP. Will eval with labs... For anemia, electrolyte issue and elevated wbcs.

## 2013-01-16 NOTE — Progress Notes (Signed)
  Subjective:    Patient ID: [name] [name], male    DOB: 1927-06-06, 79 y.o.   MRN: [id number]  HPI  79 year old male pt of Dr. Karle Starch  With moderate severe alzheimer's disease presents with wife after possible syncopal event and fall resulting in head laceration.   Wife reports that he fell around 1 pm on 01/12/2013. She was helping him after he finished using the restroom and he subsequently fell on the bathroom floor and hit his head,  Wife not sure if he lost conciousness for a time, he didn't talk for a while. No seizure. Head CT in ER was negative  No labs done.  BP was 107/54  He has had some increase in confusion, gradual change over the last year. No sudden recent changes prior to or after fall. He sleeps more. No fever. No change in cough. No rash. Some pain in right foot.  Wife has noted increase in urine odor and darkened color in last few months.  No dysuria. No incontinence.   Today BP is low.Marland Kitchen He is on plendil for HTN daily. No recent changes.   Sutures in place  and laceration  Healed well. Sutures removed without incident.  Wife encourages fluids and food, but he doesn't drink much water.  Has follow up with Dr. Alphonsus Sias on 8/18.    Review of Systems  Constitutional: Negative for fever and fatigue.  HENT: Negative for ear pain.   Eyes: Negative for pain.  Respiratory: Negative for shortness of breath.   Cardiovascular: Negative for chest pain, palpitations and leg swelling.  Gastrointestinal: Negative for abdominal pain.       Objective:   Physical Exam        Assessment & Plan:

## 2013-01-16 NOTE — Addendum Note (Signed)
Addended by: Eliezer Bottom on: 01/16/2013 04:32 PM   Modules accepted: Orders

## 2013-01-17 LAB — CBC WITH DIFFERENTIAL/PLATELET
Basophils Absolute: 0 10*3/uL (ref 0.0–0.1)
Eosinophils Relative: 3.7 % (ref 0.0–5.0)
HCT: 42.5 % (ref 39.0–52.0)
Lymphs Abs: 1.1 10*3/uL (ref 0.7–4.0)
MCV: 98.4 fl (ref 78.0–100.0)
Monocytes Absolute: 0.4 10*3/uL (ref 0.1–1.0)
Monocytes Relative: 7 % (ref 3.0–12.0)
Neutrophils Relative %: 68.9 % (ref 43.0–77.0)
Platelets: 266 10*3/uL (ref 150.0–400.0)
RDW: 13.4 % (ref 11.5–14.6)
WBC: 5.6 10*3/uL (ref 4.5–10.5)

## 2013-01-17 LAB — COMPREHENSIVE METABOLIC PANEL WITH GFR
ALT: 13 U/L (ref 0–53)
AST: 20 U/L (ref 0–37)
Albumin: 3.9 g/dL (ref 3.5–5.2)
Alkaline Phosphatase: 55 U/L (ref 39–117)
BUN: 19 mg/dL (ref 6–23)
CO2: 25 meq/L (ref 19–32)
Calcium: 9.4 mg/dL (ref 8.4–10.5)
Chloride: 110 meq/L (ref 96–112)
Creatinine, Ser: 1.1 mg/dL (ref 0.4–1.5)
GFR: 69.19 mL/min
Glucose, Bld: 107 mg/dL — ABNORMAL HIGH (ref 70–99)
Potassium: 4.2 meq/L (ref 3.5–5.1)
Sodium: 141 meq/L (ref 135–145)
Total Bilirubin: 0.8 mg/dL (ref 0.3–1.2)
Total Protein: 6.3 g/dL (ref 6.0–8.3)

## 2013-01-20 ENCOUNTER — Other Ambulatory Visit: Payer: Self-pay | Admitting: Internal Medicine

## 2013-01-20 NOTE — Telephone Encounter (Signed)
Phoned in to pharmacy. 

## 2013-01-20 NOTE — Telephone Encounter (Signed)
Okay #90 x 0 

## 2013-01-24 ENCOUNTER — Encounter: Payer: Self-pay | Admitting: Radiology

## 2013-01-27 ENCOUNTER — Encounter: Payer: Self-pay | Admitting: Internal Medicine

## 2013-01-27 ENCOUNTER — Ambulatory Visit (INDEPENDENT_AMBULATORY_CARE_PROVIDER_SITE_OTHER): Payer: Medicare Other | Admitting: Internal Medicine

## 2013-01-27 VITALS — BP 108/60 | HR 60 | Temp 98.3°F | Wt 196.0 lb

## 2013-01-27 DIAGNOSIS — F0280 Dementia in other diseases classified elsewhere without behavioral disturbance: Secondary | ICD-10-CM | POA: Diagnosis not present

## 2013-01-27 DIAGNOSIS — I10 Essential (primary) hypertension: Secondary | ICD-10-CM | POA: Diagnosis not present

## 2013-01-27 DIAGNOSIS — F411 Generalized anxiety disorder: Secondary | ICD-10-CM | POA: Diagnosis not present

## 2013-01-27 NOTE — Assessment & Plan Note (Signed)
Slow decline Needs help with all ADLs now Regular urine incontinence Discussed need for more help with wife

## 2013-01-27 NOTE — Patient Instructions (Signed)
Please hire some additional help--- I would recommend 2-3 hours of caregivers up to 3 times per week.  Please only use the alprazolam if he really gets upset or agitated.

## 2013-01-27 NOTE — Assessment & Plan Note (Signed)
BP Readings from Last 3 Encounters:  01/27/13 108/60  01/16/13 86/58  01/12/13 107/54   Remains low even off the meds Doesn't need the plendil---will stay off

## 2013-01-27 NOTE — Assessment & Plan Note (Signed)
Seems some better Sleeping a lot Will have wife only give the xanax prn

## 2013-01-27 NOTE — Progress Notes (Signed)
Subjective:    Patient ID: [name] [name], male    DOB: [birth year], 79 y.o.   MRN: [id number]  HPI Here with wife 2 recent falls--- not clear if orthostatic Is off the BP med now  Still bothered by amputated toe Feels discomfort there--- ?phantom pain  Left chest pain where he fell No SOB  [place] Has family member (sister's DIL, et al) to sit with him when she leaves Wife needs to help him with hands on for showers, bathroom and dressing Needs to be shaved and sets up toothbrush Regular urinary incontinence. Still continent of stool (wears pull ups)  Mood seems to be okay  Still gets xanax bid On citalopram also Still thinks he needs to go home or gets confused  Current Outpatient Prescriptions on File Prior to Visit  Medication Sig Dispense Refill  . acetaminophen (TYLENOL) 500 MG tablet Take 500-1,000 mg by mouth 3 (three) times daily as needed. For pain      . citalopram (CELEXA) 20 MG tablet Take 20 mg by mouth daily.      . Cyanocobalamin (VITAMIN B-12 PO) Take 1 tablet by mouth daily.       No current facility-administered medications on file prior to visit.    No Known Allergies  Past Medical History  Diagnosis Date  . GERD (gastroesophageal reflux disease)   . Hypertension   . Anxiety   . Alzheimer disease   . DIVERTICULOSIS, COLON, HX OF 09/19/2006  . ARTHRITIS 09/19/2006  . VENOUS INSUFFICIENCY 09/19/2006  . HYPERTENSION 09/19/2006  . ALZHEIMERS DISEASE 09/25/2008  . B12 DEFICIENCY 11/23/2008    Past Surgical History  Procedure Laterality Date  . Appendectomy  1980  . Eye surgery  2005    laser eye  . Back surgery  1982, 1992  . Shoulder surgery  2004    right  . Cataract extraction, bilateral  2004    Family History  Problem Relation Age of Onset  . Asthma Mother   . Alzheimer's disease Mother   . Kidney disease Father   . COPD Brother   . Alzheimer's disease Brother   . Arthritis Brother     History   Social History   . Marital Status: Married    Spouse Name: N/A    Number of Children: 2  . Years of Education: N/A   Occupational History  . grading work    Social History Main Topics  . Smoking status: Former Smoker -- 50 years    Types: Cigarettes  . Smokeless tobacco: Never Used  . Alcohol Use: No  . Drug Use: No  . Sexual Activity: No   Other Topics Concern  . Not on file   Social History Narrative   No living will, would accept resuscitation ( discussion 11/06)   Review of Systems Appetite is fine Weight stable Sleeps okay--more lately    Objective:   Physical Exam  Constitutional: He appears well-developed and well-nourished. No distress.  Neck: Normal range of motion. Neck supple. No thyromegaly present.  Cardiovascular: Normal rate, regular rhythm and normal heart sounds.  Exam reveals no gallop.   No murmur heard. Pulmonary/Chest: Effort normal and breath sounds normal. No respiratory distress. He has no wheezes. He has no rales.  Musculoskeletal: He exhibits no edema and no tenderness.  Lymphadenopathy:    He has no cervical adenopathy.  Psychiatric: He has a normal mood and affect. His behavior is normal.  Assessment & Plan:

## 2013-03-12 ENCOUNTER — Ambulatory Visit (INDEPENDENT_AMBULATORY_CARE_PROVIDER_SITE_OTHER): Payer: Medicare Other

## 2013-03-12 DIAGNOSIS — Z23 Encounter for immunization: Secondary | ICD-10-CM | POA: Diagnosis not present

## 2013-04-14 ENCOUNTER — Other Ambulatory Visit: Payer: Self-pay | Admitting: Internal Medicine

## 2013-04-14 NOTE — Telephone Encounter (Signed)
rx called into pharmacy

## 2013-04-14 NOTE — Telephone Encounter (Signed)
Alprazolam should be 1/2-1 tid prn  #90 x 0

## 2013-04-29 ENCOUNTER — Encounter: Payer: Self-pay | Admitting: Radiology

## 2013-04-29 ENCOUNTER — Ambulatory Visit: Payer: Medicare Other | Admitting: Internal Medicine

## 2013-04-30 ENCOUNTER — Ambulatory Visit (INDEPENDENT_AMBULATORY_CARE_PROVIDER_SITE_OTHER): Payer: Medicare Other | Admitting: Internal Medicine

## 2013-04-30 ENCOUNTER — Encounter: Payer: Self-pay | Admitting: Internal Medicine

## 2013-04-30 VITALS — BP 110/60 | HR 61 | Temp 98.4°F | Wt 191.0 lb

## 2013-04-30 DIAGNOSIS — F0280 Dementia in other diseases classified elsewhere without behavioral disturbance: Secondary | ICD-10-CM | POA: Diagnosis not present

## 2013-04-30 NOTE — Assessment & Plan Note (Signed)
Now not sleeping at times Incontinent of bowel and bladder Needs memory care or skilled care May need skilled care to qualify for Medicaid--discussed with wife She will figure this out--then look for places to go

## 2013-04-30 NOTE — Progress Notes (Signed)
Pre-visit discussion using our clinic review tool. No additional management support is needed unless otherwise documented below in the visit note.  

## 2013-04-30 NOTE — Progress Notes (Signed)
  Subjective:    Patient ID: [name] [name], male    DOB: May 25, 1927, 79 y.o.   MRN: [id number]  HPI Here with wife  Has ben doing worse Last week, awoke and talked all day and night Wouldn't rest--- going out "I gotta go home" Finally will wears out after 24 hours Happened 2 days in the past week Goes through the closets and messes up everything Incontinent of urine around the house Now incontinent of stool at times  Needs wife to shave him, brush his teeth Will dress himself at times if set up properly--but still needs help with buttons, etc She needs to bathe him  She went to [facility] health dept SSI too high for assistance  No threatening behavior or agitation  Current Outpatient Prescriptions on File Prior to Visit  Medication Sig Dispense Refill  . acetaminophen (TYLENOL) 500 MG tablet Take 500-1,000 mg by mouth 3 (three) times daily as needed. For pain      . ALPRAZolam (XANAX) 0.25 MG tablet Take 0.5-1 tablets (0.125-0.25 mg total) by mouth 3 (three) times daily as needed.  90 tablet  0  . citalopram (CELEXA) 20 MG tablet Take 20 mg by mouth daily.      . Cyanocobalamin (VITAMIN B-12 PO) Take 1 tablet by mouth daily.       No current facility-administered medications on file prior to visit.    No Known Allergies  Past Medical History  Diagnosis Date  . GERD (gastroesophageal reflux disease)   . Hypertension   . Anxiety   . Alzheimer disease   . DIVERTICULOSIS, COLON, HX OF 09/19/2006  . ARTHRITIS 09/19/2006  . VENOUS INSUFFICIENCY 09/19/2006  . HYPERTENSION 09/19/2006  . ALZHEIMERS DISEASE 09/25/2008  . B12 DEFICIENCY 11/23/2008    Past Surgical History  Procedure Laterality Date  . Appendectomy  1980  . Eye surgery  2005    laser eye  . Back surgery  1982, 1992  . Shoulder surgery  2004    right  . Cataract extraction, bilateral  2004    Family History  Problem Relation Age of Onset  . Asthma Mother   . Alzheimer's disease Mother   . Kidney disease  Father   . COPD Brother   . Alzheimer's disease Brother   . Arthritis Brother     History   Social History  . Marital Status: Married    Spouse Name: N/A    Number of Children: 2  . Years of Education: N/A   Occupational History  . grading work    Social History Main Topics  . Smoking status: Former Smoker -- 50 years    Types: Cigarettes  . Smokeless tobacco: Never Used  . Alcohol Use: No  . Drug Use: No  . Sexual Activity: No   Other Topics Concern  . Not on file   Social History Narrative   No living will, would accept resuscitation ( discussion 11/06)   Review of Systems Sleeps through some of his meals-- eats okay otherwise. Will feed himself after set up Has complained of his legs Has fallen--she has to call rescue to get help    Objective:   Physical Exam  Neurological:  Quiet and passive   Psychiatric:  Mood appears neutral friendly          Assessment & Plan:

## 2013-04-30 NOTE — Patient Instructions (Signed)
Please find out if he needs skilled care or assisted living to qualify for Medicaid. If assisted living, you can look at [facility], [place], [place], etc  If he needs skilled care, check into [address], [address], [facility], [place], etc Once you have a place picked out, I can do the FL-2

## 2013-05-28 ENCOUNTER — Encounter: Payer: Self-pay | Admitting: Radiology

## 2013-05-29 ENCOUNTER — Encounter: Payer: Self-pay | Admitting: Internal Medicine

## 2013-05-29 ENCOUNTER — Ambulatory Visit (INDEPENDENT_AMBULATORY_CARE_PROVIDER_SITE_OTHER): Payer: Medicare Other | Admitting: Internal Medicine

## 2013-05-29 VITALS — BP 120/77 | HR 68 | Temp 97.7°F | Wt 189.0 lb

## 2013-05-29 DIAGNOSIS — F0280 Dementia in other diseases classified elsewhere without behavioral disturbance: Secondary | ICD-10-CM

## 2013-05-29 DIAGNOSIS — F411 Generalized anxiety disorder: Secondary | ICD-10-CM

## 2013-05-29 NOTE — Assessment & Plan Note (Signed)
FL-2 done for SNF Wife is looking into Medicaid, etc

## 2013-05-29 NOTE — Assessment & Plan Note (Signed)
Stable Will need to change alprazolam to lorazepam after January 1st

## 2013-05-29 NOTE — Progress Notes (Signed)
   Subjective:    Patient ID: [name] [name], male    DOB: September 02, 1926, 79 y.o.   MRN: [id number]  HPI Here with wife  Having more falls Resists care Larey Seat over curb the other day---wouldn't accept help No major injury  Looking into placing at [facility] Needs the FL-2 Needs secure unit  Needs personal care help for all ADLs Needs to be cleaned after the bathroom Still continent in day, but incontinent of urine at night He feeds himself  Current Outpatient Prescriptions on File Prior to Visit  Medication Sig Dispense Refill  . acetaminophen (TYLENOL) 500 MG tablet Take 500-1,000 mg by mouth 3 (three) times daily as needed. For pain      . ALPRAZolam (XANAX) 0.25 MG tablet Take 0.5-1 tablets (0.125-0.25 mg total) by mouth 3 (three) times daily as needed.  90 tablet  0  . citalopram (CELEXA) 20 MG tablet Take 20 mg by mouth daily.      . Cyanocobalamin (VITAMIN B-12 PO) Take 1 tablet by mouth daily.       No current facility-administered medications on file prior to visit.    No Known Allergies  Past Medical History  Diagnosis Date  . GERD (gastroesophageal reflux disease)   . Hypertension   . Anxiety   . Alzheimer disease   . DIVERTICULOSIS, COLON, HX OF 09/19/2006  . ARTHRITIS 09/19/2006  . VENOUS INSUFFICIENCY 09/19/2006  . HYPERTENSION 09/19/2006  . ALZHEIMERS DISEASE 09/25/2008  . B12 DEFICIENCY 11/23/2008    Past Surgical History  Procedure Laterality Date  . Appendectomy  1980  . Eye surgery  2005    laser eye  . Back surgery  1982, 1992  . Shoulder surgery  2004    right  . Cataract extraction, bilateral  2004    Family History  Problem Relation Age of Onset  . Asthma Mother   . Alzheimer's disease Mother   . Kidney disease Father   . COPD Brother   . Alzheimer's disease Brother   . Arthritis Brother     History   Social History  . Marital Status: Married    Spouse Name: N/A    Number of Children: 2  . Years of Education: N/A   Occupational History   . grading work    Social History Main Topics  . Smoking status: Former Smoker -- 50 years    Types: Cigarettes  . Smokeless tobacco: Never Used  . Alcohol Use: No  . Drug Use: No  . Sexual Activity: No   Other Topics Concern  . Not on file   Social History Narrative   No living will, would accept resuscitation ( discussion 11/06)   Review of Systems Appetite is okay Weight is stable    Objective:   Physical Exam  Constitutional: He appears well-developed and well-nourished. No distress.  Psychiatric:  Calm and passive as usual Social handshake and greeting          Assessment & Plan:

## 2013-05-29 NOTE — Progress Notes (Signed)
Pre-visit discussion using our clinic review tool. No additional management support is needed unless otherwise documented below in the visit note.  

## 2013-05-29 NOTE — Patient Instructions (Signed)
Please call Sue Lush at [facility] about beds there-- [phone]

## 2013-06-30 ENCOUNTER — Other Ambulatory Visit: Payer: Self-pay | Admitting: Internal Medicine

## 2013-06-30 NOTE — Telephone Encounter (Signed)
Okay #90 x 0 

## 2013-06-30 NOTE — Telephone Encounter (Signed)
rx called into pharmacy

## 2013-06-30 NOTE — Telephone Encounter (Signed)
Last office visit 05/29/2013.  Ok to refill? 

## 2013-07-03 ENCOUNTER — Telehealth: Payer: Self-pay | Admitting: *Deleted

## 2013-07-03 NOTE — Telephone Encounter (Signed)
We don't have the capability of doing the FL-2 electronically (the social workers [facility] do it that way but we don't from the office)  Let the wife know that the [place] social worker needs the form (can fax him a copy from our records)

## 2013-07-03 NOTE — Telephone Encounter (Signed)
Social services calling asking if we sent the FL-2 form to the state electronically? I told him no that we normally give to the pt or family to give to the facility that they will be going to, if i'm correct. He states the wife is confused on what her next move is, pt is still at home he thinks. Please advise

## 2013-07-03 NOTE — Telephone Encounter (Signed)
Message left on my VM [name] something about pt's FL-2 form.   Left message to have [name] [name] return my call

## 2013-07-04 NOTE — Telephone Encounter (Signed)
He has the form and will contact wife to see what she's going to do.

## 2013-09-22 ENCOUNTER — Other Ambulatory Visit: Payer: Self-pay | Admitting: Internal Medicine

## 2013-09-22 NOTE — Telephone Encounter (Signed)
rx called into pharmacy

## 2013-09-22 NOTE — Telephone Encounter (Signed)
06/30/2013 

## 2013-09-22 NOTE — Telephone Encounter (Signed)
Okay #90 x 0 

## 2013-10-02 ENCOUNTER — Encounter: Payer: Self-pay | Admitting: Internal Medicine

## 2013-10-02 ENCOUNTER — Ambulatory Visit (INDEPENDENT_AMBULATORY_CARE_PROVIDER_SITE_OTHER): Payer: Medicare Other | Admitting: Internal Medicine

## 2013-10-02 VITALS — BP 110/60 | HR 75 | Temp 98.2°F | Wt 185.0 lb

## 2013-10-02 DIAGNOSIS — F22 Delusional disorders: Secondary | ICD-10-CM | POA: Diagnosis not present

## 2013-10-02 DIAGNOSIS — G309 Alzheimer's disease, unspecified: Principal | ICD-10-CM

## 2013-10-02 DIAGNOSIS — F39 Unspecified mood [affective] disorder: Secondary | ICD-10-CM

## 2013-10-02 DIAGNOSIS — F0280 Dementia in other diseases classified elsewhere without behavioral disturbance: Secondary | ICD-10-CM | POA: Diagnosis not present

## 2013-10-02 DIAGNOSIS — I10 Essential (primary) hypertension: Secondary | ICD-10-CM | POA: Diagnosis not present

## 2013-10-02 NOTE — Progress Notes (Signed)
Pre visit review using our clinic review tool, if applicable. No additional management support is needed unless otherwise documented below in the visit note. 

## 2013-10-02 NOTE — Assessment & Plan Note (Signed)
BP Readings from Last 3 Encounters:  10/02/13 110/60  05/29/13 120/77  04/30/13 110/60   Okay without meds

## 2013-10-02 NOTE — Assessment & Plan Note (Signed)
Talking to his "workers" ?hallucinations No need for meds at this point

## 2013-10-02 NOTE — Assessment & Plan Note (Signed)
Discussed with wife She probably needs more help or consider memory care unit---discussed the finances

## 2013-10-02 NOTE — Progress Notes (Signed)
Subjective:    Patient ID: [name] [name], male    DOB: 08/12/1926, 80 y.o.   MRN: [id number]  HPI Here with wife  Has been falling a lot still Constantly  Just gets sore--and had spot on leg--but they have healed Has to call for help to get him back up again  Looked into placement but not doing that now Couldn't get Medicaid until he was in facility Now has aides come in to help with bathing (twice a week for 3 hours) Independent with bathroom--but she has to clean after bowels She brushes his teeth, combs hair He still feeds himself after food is prepared Often doesn't know who wife is No agitated or violent behavior  Wets bed  He pulls off the diapers and will pee on floor and blanket She has to get up and down trying to take him to toilet (this is somewhat effective)  Current Outpatient Prescriptions on File Prior to Visit  Medication Sig Dispense Refill  . acetaminophen (TYLENOL) 500 MG tablet Take 500-1,000 mg by mouth 3 (three) times daily as needed. For pain      . ALPRAZolam (XANAX) 0.25 MG tablet take 1/2 to 1 tablet by mouth three times a day if needed  90 tablet  0  . citalopram (CELEXA) 20 MG tablet Take 20 mg by mouth daily.      . Cyanocobalamin (VITAMIN B-12 PO) Take 1 tablet by mouth daily.       No current facility-administered medications on file prior to visit.    No Known Allergies  Past Medical History  Diagnosis Date  . GERD (gastroesophageal reflux disease)   . Hypertension   . Anxiety   . Alzheimer disease   . DIVERTICULOSIS, COLON, HX OF 09/19/2006  . ARTHRITIS 09/19/2006  . VENOUS INSUFFICIENCY 09/19/2006  . HYPERTENSION 09/19/2006  . ALZHEIMERS DISEASE 09/25/2008  . B12 DEFICIENCY 11/23/2008    Past Surgical History  Procedure Laterality Date  . Appendectomy  1980  . Eye surgery  2005    laser eye  . Back surgery  1982, 1992  . Shoulder surgery  2004    right  . Cataract extraction, bilateral  2004    Family History  Problem  Relation Age of Onset  . Asthma Mother   . Alzheimer's disease Mother   . Kidney disease Father   . COPD Brother   . Alzheimer's disease Brother   . Arthritis Brother     History   Social History  . Marital Status: Married    Spouse Name: N/A    Number of Children: 2  . Years of Education: N/A   Occupational History  . grading work    Social History Main Topics  . Smoking status: Former Smoker -- 50 years    Types: Cigarettes  . Smokeless tobacco: Never Used  . Alcohol Use: No  . Drug Use: No  . Sexual Activity: No   Other Topics Concern  . Not on file   Social History Narrative   No living will, would accept resuscitation ( discussion 11/06)   Review of Systems Sleeps okay in general. Occasionally has spells of talking to people in the room, etc. Thinks it is people who work for him--so not afraid, etc Weight is stable (down 4#)     Objective:   Physical Exam  Constitutional: He appears well-developed and well-nourished. No distress.  Neck: Normal range of motion. Neck supple. No thyromegaly present.  Cardiovascular: Normal rate, regular rhythm  and normal heart sounds.  Exam reveals no gallop.   No murmur heard. Pulmonary/Chest: Effort normal and breath sounds normal. No respiratory distress. He has no wheezes. He has no rales.  Musculoskeletal: He exhibits no edema and no tenderness.  Lymphadenopathy:    He has no cervical adenopathy.  Neurological:  Passive but friendly Answers when directly asked questions          Assessment & Plan:

## 2013-10-02 NOTE — Assessment & Plan Note (Signed)
Mostly anxiety at times Does okay on current meds Gets the alprazolam once a day usually

## 2013-10-03 ENCOUNTER — Telehealth: Payer: Self-pay | Admitting: Internal Medicine

## 2013-10-03 NOTE — Telephone Encounter (Signed)
Relevant patient education mailed to patient.  

## 2013-11-02 ENCOUNTER — Other Ambulatory Visit: Payer: Self-pay | Admitting: Internal Medicine

## 2013-11-04 NOTE — Telephone Encounter (Signed)
09/22/13 for the xanax

## 2013-11-04 NOTE — Telephone Encounter (Signed)
Okay 1 year for citalopram #90 x 0 for the alprazolam

## 2013-11-04 NOTE — Telephone Encounter (Signed)
rx called into pharmacy rx sent to pharmacy by e-script  

## 2013-11-05 ENCOUNTER — Telehealth: Payer: Self-pay | Admitting: *Deleted

## 2013-11-05 ENCOUNTER — Encounter: Payer: Self-pay | Admitting: Internal Medicine

## 2013-11-05 ENCOUNTER — Ambulatory Visit (INDEPENDENT_AMBULATORY_CARE_PROVIDER_SITE_OTHER): Payer: Medicare Other | Admitting: Internal Medicine

## 2013-11-05 VITALS — BP 128/60 | HR 68 | Temp 97.7°F | Wt 193.0 lb

## 2013-11-05 DIAGNOSIS — J019 Acute sinusitis, unspecified: Secondary | ICD-10-CM | POA: Diagnosis not present

## 2013-11-05 MED ORDER — AMOXICILLIN 500 MG PO TABS: 1000.0000 mg | ORAL_TABLET | Freq: Two times a day (BID) | ORAL | Status: DC

## 2013-11-05 NOTE — Assessment & Plan Note (Signed)
Seems to be secondary bacterial infection Will treat with amoxil If his eyes don't improve, will treat with drops later in the week

## 2013-11-05 NOTE — Progress Notes (Signed)
   Subjective:    Patient ID: [name] [name], male    DOB: 22-Sep-1926, 80 y.o.   MRN: [id number]  HPI Here with wife  Wife has had a bad cold--diagnosed with bacterial infection and now on Rx He is sick also Eyes very red with lots of discharge Lots of cough--productive Sick for about a week  No fever No dyspnea No night sweats or chills that wife has noted  Wife giving tylenol  Current Outpatient Prescriptions on File Prior to Visit  Medication Sig Dispense Refill  . acetaminophen (TYLENOL) 500 MG tablet Take 500-1,000 mg by mouth 3 (three) times daily as needed. For pain      . ALPRAZolam (XANAX) 0.25 MG tablet take 1/2 to 1 tablet by mouth three times a day if needed  90 tablet  0  . citalopram (CELEXA) 20 MG tablet take 1 tablet by mouth once daily for anxiety  30 tablet  11  . Cyanocobalamin (VITAMIN B-12 PO) Take 1 tablet by mouth daily.       No current facility-administered medications on file prior to visit.    No Known Allergies  Past Medical History  Diagnosis Date  . GERD (gastroesophageal reflux disease)   . Hypertension   . Anxiety   . Alzheimer disease   . DIVERTICULOSIS, COLON, HX OF 09/19/2006  . ARTHRITIS 09/19/2006  . VENOUS INSUFFICIENCY 09/19/2006  . HYPERTENSION 09/19/2006  . ALZHEIMERS DISEASE 09/25/2008  . B12 DEFICIENCY 11/23/2008    Past Surgical History  Procedure Laterality Date  . Appendectomy  1980  . Eye surgery  2005    laser eye  . Back surgery  1982, 1992  . Shoulder surgery  2004    right  . Cataract extraction, bilateral  2004    Family History  Problem Relation Age of Onset  . Asthma Mother   . Alzheimer's disease Mother   . Kidney disease Father   . COPD Brother   . Alzheimer's disease Brother   . Arthritis Brother     History   Social History  . Marital Status: Married    Spouse Name: N/A    Number of Children: 2  . Years of Education: N/A   Occupational History  . grading work    Social History Main Topics  .  Smoking status: Former Smoker -- 50 years    Types: Cigarettes  . Smokeless tobacco: Never Used  . Alcohol Use: No  . Drug Use: No  . Sexual Activity: No   Other Topics Concern  . Not on file   Social History Narrative   No living will, would accept resuscitation ( discussion 11/06)   Review of Systems 2 more recent falls Some left knee pain Appetite is off some No vomiting or diarrhea    Objective:   Physical Exam  Constitutional: He appears well-developed and well-nourished. No distress.  HENT:  Mouth/Throat: Oropharynx is clear and moist. No oropharyngeal exudate.  No sinus tenderness Moderate nasal congestion TMs normal  Eyes:  Conjunctiva red bilaterally and some discharge  Neck: Normal range of motion. Neck supple.  Pulmonary/Chest: Effort normal and breath sounds normal. No respiratory distress. He has no wheezes. He has no rales.  Lymphadenopathy:    He has no cervical adenopathy.          Assessment & Plan:

## 2013-11-05 NOTE — Patient Instructions (Signed)
Please call me later in the week if his eyes aren't better  You can call Sue Lush, the admissions coordinator at [facility] --- [phone]--- to see if they can take him.

## 2013-11-05 NOTE — Telephone Encounter (Signed)
Pt's wife spoke with [facility] and they will need a FL-2 form. Please advise

## 2013-11-05 NOTE — Telephone Encounter (Signed)
I will take care of this when I am back in the office

## 2013-11-05 NOTE — Progress Notes (Signed)
Pre visit review using our clinic review tool, if applicable. No additional management support is needed unless otherwise documented below in the visit note. 

## 2013-11-07 ENCOUNTER — Telehealth: Payer: Self-pay

## 2013-11-07 MED ORDER — SULFACETAMIDE SODIUM 10 % OP SOLN: 2.0000 [drp] | Freq: Four times a day (QID) | OPHTHALMIC | Status: DC

## 2013-11-07 NOTE — Telephone Encounter (Signed)
Spoke with patient's wife and advised results  

## 2013-11-07 NOTE — Telephone Encounter (Signed)
Mrs Wigton left v/m; pt seen on 11/05/13; pts eyes have not improved and request eye drops sent to [facility]. Mrs Comar request cb.

## 2013-11-07 NOTE — Telephone Encounter (Signed)
Please let her know I sent the Rx for his eyes I will also give the FL-2 to Sue Lush at [facility] on [address] is done

## 2013-11-07 NOTE — Telephone Encounter (Signed)
Form done I will bring it to [facility]

## 2013-11-28 ENCOUNTER — Telehealth: Payer: Self-pay | Admitting: *Deleted

## 2013-11-28 ENCOUNTER — Ambulatory Visit (INDEPENDENT_AMBULATORY_CARE_PROVIDER_SITE_OTHER): Payer: Medicare Other | Admitting: *Deleted

## 2013-11-28 DIAGNOSIS — Z111 Encounter for screening for respiratory tuberculosis: Secondary | ICD-10-CM | POA: Diagnosis not present

## 2013-11-28 NOTE — Telephone Encounter (Signed)
Pt's wife never heard back from [facility] and [facility], she has seen his room and likes everything there. They will need another FL-2 for filled out ( on your desk with the old one attached) I did advise wife that this may take a day or so because Dr. Alphonsus SiasLetvak is just coming back from vacation and she understood and [facility] Dr[facility] Alphonsus Siasletvak and will still admit pt per wife.

## 2013-12-01 LAB — TB SKIN TEST
Induration: 0 mm
TB SKIN TEST: NEGATIVE

## 2013-12-01 NOTE — Telephone Encounter (Signed)
Done Send copy of my last OV also

## 2013-12-01 NOTE — Telephone Encounter (Signed)
[facility]

## 2013-12-03 ENCOUNTER — Other Ambulatory Visit: Payer: Self-pay | Admitting: *Deleted

## 2013-12-03 ENCOUNTER — Telehealth: Payer: Self-pay | Admitting: *Deleted

## 2013-12-03 NOTE — Telephone Encounter (Signed)
[name] [name] is assisted living--not skilled nursing

## 2013-12-03 NOTE — Telephone Encounter (Signed)
rx called into pharmacy

## 2013-12-03 NOTE — Telephone Encounter (Signed)
Per wife patient would have to be moved to skilled nursing in order to get help with the bill, per wife pt gets about $1800.00 a month and the lawyer states he would need skilled nursing. Wife still hasn't heard anything [facility]. Please advise

## 2013-12-03 NOTE — Telephone Encounter (Signed)
Wife calling wanting to know [facility]? Wife has contacted a [facility] and they state pt can't get any help unless he's in skilled nursing. Please advise

## 2013-12-04 NOTE — Telephone Encounter (Signed)
She can try calling Sue Lushndrea again to see if there is a bed open for a man now [phone]

## 2013-12-04 NOTE — Telephone Encounter (Signed)
Phone just rang, no answering machine, will try again later  

## 2013-12-05 NOTE — Telephone Encounter (Signed)
No answer and no answering machine, will try again later. 

## 2013-12-26 ENCOUNTER — Telehealth: Payer: Self-pay | Admitting: Internal Medicine

## 2013-12-26 NOTE — Telephone Encounter (Signed)
Discussed this with [name] [name]--the unit coordinator. This has just been in the past couple of days Otherwise okay Will just watch for now May need eval if persists

## 2013-12-26 NOTE — Telephone Encounter (Signed)
[name] [name] from [facility]:  [name] CheMargaret states every time pt sits down to use the bathroom he is having a "blow out".  It was checked "no" for Immodium on the standing orders, but she thinks he may need it.  [phone]

## 2013-12-29 NOTE — Telephone Encounter (Signed)
I would not give imodium or the like unless I saw him If he is still having diarrhea, he needs a visit here

## 2013-12-29 NOTE — Telephone Encounter (Signed)
appt scheduled for 12/30/13

## 2013-12-29 NOTE — Telephone Encounter (Signed)
Mrs Harlon FlorWhitaker left v/m; pt has had diarrhea for a week or so and she believes pt needs med for diarrhea. Mrs Harlon FlorWhitaker said that pt has had watery stools for a week and Mrs Harlon FlorWhitaker gave pt Pepto Bismol once a day for the past 3 days without any change in diarrhea. Mrs Harlon FlorWhitaker request cb at [phone].

## 2013-12-30 ENCOUNTER — Encounter: Payer: Self-pay | Admitting: Internal Medicine

## 2013-12-30 ENCOUNTER — Ambulatory Visit (INDEPENDENT_AMBULATORY_CARE_PROVIDER_SITE_OTHER): Payer: Medicare Other | Admitting: Internal Medicine

## 2013-12-30 VITALS — BP 120/70 | HR 60 | Temp 98.2°F | Wt 190.0 lb

## 2013-12-30 DIAGNOSIS — R197 Diarrhea, unspecified: Secondary | ICD-10-CM

## 2013-12-30 MED ORDER — CHOLESTYRAMINE LIGHT 4 G PO PACK: 4.0000 g | PACK | Freq: Every day | ORAL | Status: DC

## 2013-12-30 NOTE — Progress Notes (Signed)
Pre visit review using our clinic review tool, if applicable. No additional management support is needed unless otherwise documented below in the visit note. 

## 2013-12-30 NOTE — Assessment & Plan Note (Signed)
Not sick In discussing with wife, he may have had some degree of regularly loose stools Will try cholestyramine If this doesn't work, will add imodium

## 2013-12-30 NOTE — Progress Notes (Signed)
   Subjective:    Patient ID: [name] [name], male    DOB: 04/16/1927, 80 y.o.   MRN: [id number]  HPI Here with wife  Having some diarrhea--started a week ago Already went 3 times today---brown and watery No abdominal pain No blood in stool No fever  Appetite is fine  Having some leg pain Larey SeatFell out of bed a week ago--had some head pain (but improving)  Current Outpatient Prescriptions on File Prior to Visit  Medication Sig Dispense Refill  . acetaminophen (TYLENOL) 500 MG tablet Take 500-1,000 mg by mouth 3 (three) times daily as needed. For pain      . ALPRAZolam (XANAX) 0.25 MG tablet take 1/2 to 1 tablet by mouth three times a day if needed  90 tablet  0  . citalopram (CELEXA) 20 MG tablet take 1 tablet by mouth once daily for anxiety  30 tablet  11   No current facility-administered medications on file prior to visit.    Allergies  Allergen Reactions  . Codeine Other (See Comments)    Other Reaction: Not Assessed    Past Medical History  Diagnosis Date  . GERD (gastroesophageal reflux disease)   . Hypertension   . Anxiety   . Alzheimer disease   . DIVERTICULOSIS, COLON, HX OF 09/19/2006  . ARTHRITIS 09/19/2006  . VENOUS INSUFFICIENCY 09/19/2006  . HYPERTENSION 09/19/2006  . ALZHEIMERS DISEASE 09/25/2008  . B12 DEFICIENCY 11/23/2008    Past Surgical History  Procedure Laterality Date  . Appendectomy  1980  . Eye surgery  2005    laser eye  . Back surgery  1982, 1992  . Shoulder surgery  2004    right  . Cataract extraction, bilateral  2004    Family History  Problem Relation Age of Onset  . Asthma Mother   . Alzheimer's disease Mother   . Kidney disease Father   . COPD Brother   . Alzheimer's disease Brother   . Arthritis Brother     History   Social History  . Marital Status: Married    Spouse Name: N/A    Number of Children: 2  . Years of Education: N/A   Occupational History  . grading work    Social History Main Topics  . Smoking status:  Former Smoker -- 50 years    Types: Cigarettes  . Smokeless tobacco: Never Used  . Alcohol Use: No  . Drug Use: No  . Sexual Activity: No   Other Topics Concern  . Not on file   Social History Narrative   No living will, would accept resuscitation ( discussion 11/06)   Review of Systems Weight stable or slightly down Sleeping okay    Objective:   Physical Exam  Constitutional: He appears well-developed and well-nourished. No distress.  Appears his normal self  Abdominal: Soft. Bowel sounds are normal. He exhibits no distension. There is no tenderness. There is no rebound and no guarding.          Assessment & Plan:

## 2014-01-12 ENCOUNTER — Telehealth: Payer: Self-pay | Admitting: Internal Medicine

## 2014-01-12 NOTE — Telephone Encounter (Signed)
[facility] and she will try robitussin overnight to see if that helps, she will call the wife to have her schedule an appt here if that does't. Advised debbie that Dr. Alphonsus SiasLetvak won't be back until wednesday

## 2014-01-12 NOTE — Telephone Encounter (Signed)
Caller: Margaret/ [employer] Phone: [phone] is calling from [facility]; Reason for Call: Cough/congestion.  Triage not completed.  [facility], in office, per office request on practice profile.

## 2014-01-15 ENCOUNTER — Encounter: Payer: Self-pay | Admitting: Internal Medicine

## 2014-01-15 ENCOUNTER — Non-Acute Institutional Stay: Payer: Medicare Other | Admitting: Internal Medicine

## 2014-01-15 VITALS — BP 135/70 | HR 72 | Temp 96.8°F | Resp 18 | Wt 190.0 lb

## 2014-01-15 DIAGNOSIS — G309 Alzheimer's disease, unspecified: Secondary | ICD-10-CM | POA: Diagnosis not present

## 2014-01-15 DIAGNOSIS — M150 Primary generalized (osteo)arthritis: Secondary | ICD-10-CM

## 2014-01-15 DIAGNOSIS — R197 Diarrhea, unspecified: Secondary | ICD-10-CM

## 2014-01-15 DIAGNOSIS — M159 Polyosteoarthritis, unspecified: Secondary | ICD-10-CM

## 2014-01-15 DIAGNOSIS — F0280 Dementia in other diseases classified elsewhere without behavioral disturbance: Secondary | ICD-10-CM

## 2014-01-15 DIAGNOSIS — F39 Unspecified mood [affective] disorder: Secondary | ICD-10-CM | POA: Diagnosis not present

## 2014-01-15 DIAGNOSIS — Z7189 Other specified counseling: Secondary | ICD-10-CM

## 2014-01-15 DIAGNOSIS — I10 Essential (primary) hypertension: Secondary | ICD-10-CM

## 2014-01-15 NOTE — Assessment & Plan Note (Signed)
Mood has been good Will consider trying to wean citalopram at next visit if doing well

## 2014-01-15 NOTE — Assessment & Plan Note (Signed)
Slow decline Has adjusted well to being here

## 2014-01-15 NOTE — Assessment & Plan Note (Signed)
Vs some muscle aches also Mostly in AM Will try some regular tylenol arthritis

## 2014-01-15 NOTE — Assessment & Plan Note (Signed)
See social history 

## 2014-01-15 NOTE — Assessment & Plan Note (Signed)
Better with the cholestyramine 

## 2014-01-15 NOTE — Assessment & Plan Note (Signed)
Still fine without meds BP Readings from Last 3 Encounters:  01/15/14 135/70  12/30/13 120/70  11/05/13 128/60

## 2014-01-15 NOTE — Progress Notes (Signed)
Subjective:    Patient ID: [name] [name], male    DOB: June 13, 1926, 80 y.o.   MRN: [id number]  HPI Wife is here  No concerns from staff--now here about 2 months Wife is satisfied--but may run out of money by the end of the year He is happy here Needs help with all ADLs except eating Still walks around  He does have regular leg pain Mostly in the mornings Mostly muscle aches--doesn't seem to be predominantly in knees or hips Hasn't been getting any acetaminophen  Diarrhea seems to be better with the cholestyramine Appetite is good Weight is stable  Occasional anxiety --uses the alprazolam in AM Often gets 1 prn dose sometime in the day  Current Outpatient Prescriptions on File Prior to Visit  Medication Sig Dispense Refill  . cholestyramine light (PREVALITE) 4 G packet Take 1 packet (4 g total) by mouth daily.  30 packet  11  . citalopram (CELEXA) 20 MG tablet take 1 tablet by mouth once daily for anxiety  30 tablet  11   No current facility-administered medications on file prior to visit.    Allergies  Allergen Reactions  . Codeine Other (See Comments)    Other Reaction: Not Assessed    Past Medical History  Diagnosis Date  . GERD (gastroesophageal reflux disease)   . Hypertension   . Anxiety   . Alzheimer disease   . DIVERTICULOSIS, COLON, HX OF 09/19/2006  . ARTHRITIS 09/19/2006  . VENOUS INSUFFICIENCY 09/19/2006  . HYPERTENSION 09/19/2006  . ALZHEIMERS DISEASE 09/25/2008  . B12 DEFICIENCY 11/23/2008    Past Surgical History  Procedure Laterality Date  . Appendectomy  1980  . Eye surgery  2005    laser eye  . Back surgery  1982, 1992  . Shoulder surgery  2004    right  . Cataract extraction, bilateral  2004    Family History  Problem Relation Age of Onset  . Asthma Mother   . Alzheimer's disease Mother   . Kidney disease Father   . COPD Brother   . Alzheimer's disease Brother   . Arthritis Brother     History   Social History  . Marital Status:  Married    Spouse Name: N/A    Number of Children: 2  . Years of Education: N/A   Occupational History  . grading work    Social History Main Topics  . Smoking status: Former Smoker -- 50 years    Types: Cigarettes  . Smokeless tobacco: Never Used  . Alcohol Use: No  . Drug Use: No  . Sexual Activity: No   Other Topics Concern  . Not on file   Social History Narrative   No living will, would accept resuscitation ( discussion 11/06)  Reviewed advanced directives Note addended  Review of Systems Seems to be sleeping better here No trouble voiding No chest pain No SOB No sig edema    Objective:   Physical Exam  Constitutional: He appears well-developed and well-nourished. No distress.  Neck: Normal range of motion. Neck supple. No thyromegaly present.  Cardiovascular: Normal rate, regular rhythm and normal heart sounds.  Exam reveals no gallop.   No murmur heard. Pulmonary/Chest: Effort normal and breath sounds normal. No respiratory distress. He has no wheezes. He has no rales.  Abdominal: Soft. There is no tenderness.  Musculoskeletal: He exhibits no edema and no tenderness.  Lymphadenopathy:    He has no cervical adenopathy.  Neurological:  Responds to questions but doesn't  volunteer any info.    Psychiatric:  Clearly seems satisfied and content          Assessment & Plan:

## 2014-01-17 DIAGNOSIS — M6281 Muscle weakness (generalized): Secondary | ICD-10-CM | POA: Diagnosis not present

## 2014-01-17 DIAGNOSIS — R262 Difficulty in walking, not elsewhere classified: Secondary | ICD-10-CM | POA: Diagnosis not present

## 2014-01-17 DIAGNOSIS — M545 Low back pain, unspecified: Secondary | ICD-10-CM | POA: Diagnosis not present

## 2014-01-22 DIAGNOSIS — M6281 Muscle weakness (generalized): Secondary | ICD-10-CM | POA: Diagnosis not present

## 2014-01-22 DIAGNOSIS — R262 Difficulty in walking, not elsewhere classified: Secondary | ICD-10-CM | POA: Diagnosis not present

## 2014-01-22 DIAGNOSIS — M545 Low back pain, unspecified: Secondary | ICD-10-CM | POA: Diagnosis not present

## 2014-01-27 DIAGNOSIS — M545 Low back pain, unspecified: Secondary | ICD-10-CM | POA: Diagnosis not present

## 2014-01-27 DIAGNOSIS — M6281 Muscle weakness (generalized): Secondary | ICD-10-CM | POA: Diagnosis not present

## 2014-01-27 DIAGNOSIS — R262 Difficulty in walking, not elsewhere classified: Secondary | ICD-10-CM | POA: Diagnosis not present

## 2014-01-29 DIAGNOSIS — M545 Low back pain, unspecified: Secondary | ICD-10-CM | POA: Diagnosis not present

## 2014-01-29 DIAGNOSIS — R262 Difficulty in walking, not elsewhere classified: Secondary | ICD-10-CM | POA: Diagnosis not present

## 2014-01-29 DIAGNOSIS — M6281 Muscle weakness (generalized): Secondary | ICD-10-CM | POA: Diagnosis not present

## 2014-02-02 ENCOUNTER — Ambulatory Visit: Payer: Medicare Other | Admitting: Internal Medicine

## 2014-02-04 DIAGNOSIS — M545 Low back pain, unspecified: Secondary | ICD-10-CM | POA: Diagnosis not present

## 2014-02-04 DIAGNOSIS — M6281 Muscle weakness (generalized): Secondary | ICD-10-CM | POA: Diagnosis not present

## 2014-02-04 DIAGNOSIS — R262 Difficulty in walking, not elsewhere classified: Secondary | ICD-10-CM | POA: Diagnosis not present

## 2014-02-12 ENCOUNTER — Other Ambulatory Visit: Payer: Self-pay | Admitting: Internal Medicine

## 2014-02-20 ENCOUNTER — Telehealth: Payer: Self-pay | Admitting: Internal Medicine

## 2014-02-20 NOTE — Telephone Encounter (Signed)
Patient Information:  Caller Name: [name]  Phone: [phone]  Patient: [name] [name], [name] [name]  Gender: Male  DOB: 17-May-1927  Age: 80 Years  PCP: Tillman Abide [facility])  Office Follow Up:  Does the office need to follow up with this patient?: Yes  Instructions For The Office: Wife needs return call regarding plan of care  RN Note:  Wife calling regarding [name] who has had a change in bowel habits.  C/O loose stools for past month sometimes having BM 6 - 8 times a day.  Symptoms  Reason For Call & Symptoms: loose stools  Reviewed Health History In EMR: Yes  Reviewed Medications In EMR: Yes  Reviewed Allergies In EMR: Yes  Reviewed Surgeries / Procedures: Yes  Date of Onset of Symptoms: 01/20/2014  Guideline(s) Used:  No Protocol Available - Information Only  Disposition Per Guideline:   Discuss with PCP and Callback by Nurse Today  Reason For Disposition Reached:   Nursing judgment  Advice Given:  N/A  Patient Will Follow Care Advice:  YES

## 2014-02-20 NOTE — Telephone Encounter (Signed)
Spoke with wife and she will let [facility] know about the diarrhea, I advised pt has standing orders and they should be giving him something for diarrhea. Per wife she will contact the nursing home and let us know.

## 2014-02-21 NOTE — Telephone Encounter (Signed)
The diarrhea is not new  I will increase his cholestyramine to bid Please prepare a Leanor Kail order stating this and I will sign on Monday

## 2014-02-23 ENCOUNTER — Ambulatory Visit (INDEPENDENT_AMBULATORY_CARE_PROVIDER_SITE_OTHER): Payer: Medicare Other | Admitting: Internal Medicine

## 2014-02-23 ENCOUNTER — Encounter: Payer: Self-pay | Admitting: Internal Medicine

## 2014-02-23 VITALS — BP 128/70 | HR 73 | Temp 98.0°F | Wt 197.0 lb

## 2014-02-23 DIAGNOSIS — R197 Diarrhea, unspecified: Secondary | ICD-10-CM | POA: Diagnosis not present

## 2014-02-23 DIAGNOSIS — Z23 Encounter for immunization: Secondary | ICD-10-CM

## 2014-02-23 NOTE — Assessment & Plan Note (Signed)
Doesn't appear to be infectious Certainly no dehydration--weight up and appetite is great Will try increasing cholestyramine If doesn't respond to this, will add imodium

## 2014-02-23 NOTE — Telephone Encounter (Signed)
Wife scheduled an appt today at 2:00 should I call and cancel the appt or she can bring him on in?

## 2014-02-23 NOTE — Progress Notes (Signed)
Pre visit review using our clinic review tool, if applicable. No additional management support is needed unless otherwise documented below in the visit note. 

## 2014-02-23 NOTE — Telephone Encounter (Signed)
Check with her about whether she thinks he still needs it If diarrhea persists, keep the appt

## 2014-02-23 NOTE — Progress Notes (Signed)
   Subjective:    Patient ID: [name] [name], male    DOB: 24-Apr-1927, 80 y.o.   MRN: [id number]  HPI Here with wife Still having frequent loose stools Can have a small amount of stool, lots of gas and lots of small particles  No abdominal pain No fever No blood in stool that has been reported  Appetite is good  Current Outpatient Prescriptions on File Prior to Visit  Medication Sig Dispense Refill  . ALPRAZolam (XANAX) 0.25 MG tablet 1 tablet each morning AND  1 tablet by mouth three times a day if needed      . cholestyramine light (PREVALITE) 4 G packet Take 1 packet (4 g total) by mouth daily.  30 packet  11  . citalopram (CELEXA) 20 MG tablet take 1 tablet by mouth once daily for anxiety  30 tablet  11  . MAPAP ARTHRITIS PAIN 650 MG CR tablet TAKE ONE TABLET BY MOUTH 3 TIMES A DAY *DO NOT CRUSH*  90 tablet  11  . vitamin B-12 (CYANOCOBALAMIN) 1000 MCG tablet Take 1,000 mcg by mouth daily.       No current facility-administered medications on file prior to visit.    Allergies  Allergen Reactions  . Codeine Other (See Comments)    Other Reaction: Not Assessed    Past Medical History  Diagnosis Date  . GERD (gastroesophageal reflux disease)   . Hypertension   . Anxiety   . Alzheimer disease   . DIVERTICULOSIS, COLON, HX OF 09/19/2006  . ARTHRITIS 09/19/2006  . VENOUS INSUFFICIENCY 09/19/2006  . HYPERTENSION 09/19/2006  . ALZHEIMERS DISEASE 09/25/2008  . B12 DEFICIENCY 11/23/2008    Past Surgical History  Procedure Laterality Date  . Appendectomy  1980  . Eye surgery  2005    laser eye  . Back surgery  1982, 1992  . Shoulder surgery  2004    right  . Cataract extraction, bilateral  2004    Family History  Problem Relation Age of Onset  . Asthma Mother   . Alzheimer's disease Mother   . Kidney disease Father   . COPD Brother   . Alzheimer's disease Brother   . Arthritis Brother     History   Social History  . Marital Status: Married    Spouse Name: N/A    Number of Children: 2  . Years of Education: N/A   Occupational History  . grading work    Social History Main Topics  . Smoking status: Former Smoker -- 50 years    Types: Cigarettes  . Smokeless tobacco: Never Used  . Alcohol Use: No  . Drug Use: No  . Sexual Activity: No   Other Topics Concern  . Not on file   Social History Narrative   Wife is health care POA   She requests at least resuscitation attempts for now.   No prolonged ventilation and no tube feeds   Review of Systems Weight is up some from [facility] No recent travel    Objective:   Physical Exam  Constitutional: He appears well-developed and well-nourished. No distress.  Abdominal: Soft. He exhibits no distension. There is no tenderness. There is no rebound and no guarding.          Assessment & Plan:

## 2014-02-23 NOTE — Telephone Encounter (Signed)
Wife would like to keep the appt, she thinks pt may have bacteria in his stool, wife states he's having "blow outs" and it's real messy, wife spoke with debbie today and she thought Dr. Alphonsus Sias would want to see him.

## 2014-02-23 NOTE — Addendum Note (Signed)
Addended by: Sueanne Margarita on: 02/23/2014 04:09 PM   Modules accepted: Orders

## 2014-05-05 ENCOUNTER — Telehealth: Payer: Self-pay | Admitting: Internal Medicine

## 2014-05-05 NOTE — Telephone Encounter (Signed)
Discussed with Claris [facility]. Will add loperamide as planned

## 2014-05-05 NOTE — Telephone Encounter (Signed)
Debbie from [facility] called for pt wife because she is still really concerned pt is having really bad "blow outs". Please advise

## 2014-06-18 ENCOUNTER — Non-Acute Institutional Stay: Payer: Medicare Other | Admitting: Internal Medicine

## 2014-06-18 ENCOUNTER — Encounter: Payer: Self-pay | Admitting: Internal Medicine

## 2014-06-18 VITALS — BP 120/78 | HR 60 | Resp 18 | Wt 195.0 lb

## 2014-06-18 DIAGNOSIS — G309 Alzheimer's disease, unspecified: Secondary | ICD-10-CM | POA: Diagnosis not present

## 2014-06-18 DIAGNOSIS — M159 Polyosteoarthritis, unspecified: Secondary | ICD-10-CM

## 2014-06-18 DIAGNOSIS — F0280 Dementia in other diseases classified elsewhere without behavioral disturbance: Secondary | ICD-10-CM

## 2014-06-18 DIAGNOSIS — M150 Primary generalized (osteo)arthritis: Secondary | ICD-10-CM | POA: Diagnosis not present

## 2014-06-18 DIAGNOSIS — R197 Diarrhea, unspecified: Secondary | ICD-10-CM | POA: Diagnosis not present

## 2014-06-18 DIAGNOSIS — I10 Essential (primary) hypertension: Secondary | ICD-10-CM | POA: Diagnosis not present

## 2014-06-18 DIAGNOSIS — F39 Unspecified mood [affective] disorder: Secondary | ICD-10-CM | POA: Diagnosis not present

## 2014-06-18 NOTE — Assessment & Plan Note (Signed)
Moderate with sig care needs but hasn't declined Wife is going to have money issues--and may need to take him home again later this year

## 2014-06-18 NOTE — Assessment & Plan Note (Signed)
Mentions leg and foot pain but doesn't seem bad Will just continue the acetaminophen for now

## 2014-06-18 NOTE — Progress Notes (Signed)
   Subjective:    Patient ID: [name] [name], male    DOB: 01/03/1927, 81 y.o.   MRN: [id number]  HPI Seen with wife and unit coordinator Debbie Follow up of dementia and other chronic medical problems  Has been fairly stable Still walks with minimal assist Did fall out of chair some time ago--no injury Continues to require assist with all ADLs--but will feed himself  Still has frequent stools Now soft and lots of air Often incontinent  Mood is better here than at home Settles easier Not as agitated Often talks to other residents a lot---seems to keep him occupied  No chest pain No SOB No apparent dizziness  Still with some feet and leg pain Seems to get some relief with the acetaminophen Doesn't seem too bad though  Current Outpatient Prescriptions on File Prior to Visit  Medication Sig Dispense Refill  . ALPRAZolam (XANAX) 0.25 MG tablet 1 tablet each morning AND  1 tablet by mouth three times a day if needed    . cholestyramine light (PREVALITE) 4 G packet Take 4 g by mouth 2 (two) times daily.    . citalopram (CELEXA) 20 MG tablet take 1 tablet by mouth once daily for anxiety 30 tablet 11  . loperamide (IMODIUM) 2 MG capsule Take 2 mg by mouth daily. Repeat ~noon if still having loose stools    . MAPAP ARTHRITIS PAIN 650 MG CR tablet TAKE ONE TABLET BY MOUTH 3 TIMES A DAY *DO NOT CRUSH* 90 tablet 11  . vitamin B-12 (CYANOCOBALAMIN) 1000 MCG tablet Take 1,000 mcg by mouth daily.     No current facility-administered medications on file prior to visit.    Allergies  Allergen Reactions  . Codeine Other (See Comments)    Other Reaction: Not Assessed    Past Medical History  Diagnosis Date  . GERD (gastroesophageal reflux disease)   . Hypertension   . Anxiety   . Alzheimer disease   . DIVERTICULOSIS, COLON, HX OF 09/19/2006  . ARTHRITIS 09/19/2006  . VENOUS INSUFFICIENCY 09/19/2006  . HYPERTENSION 09/19/2006  . ALZHEIMERS DISEASE 09/25/2008  . B12 DEFICIENCY 11/23/2008     Past Surgical History  Procedure Laterality Date  . Appendectomy  1980  . Eye surgery  2005    laser eye  . Back surgery  1982, 1992  . Shoulder surgery  2004    right  . Cataract extraction, bilateral  2004    Family History  Problem Relation Age of Onset  . Asthma Mother   . Alzheimer's disease Mother   . Kidney disease Father   . COPD Brother   . Alzheimer's disease Brother   . Arthritis Brother      Review of Systems Appetite is good Weight is stable Sleeps well in general    Objective:   Physical Exam  Constitutional: He appears well-developed and well-nourished. No distress.  Neck: Normal range of motion. Neck supple. No thyromegaly present.  Cardiovascular: Normal rate, regular rhythm and normal heart sounds.  Exam reveals no gallop.   No murmur heard. Pulmonary/Chest: Effort normal and breath sounds normal. No respiratory distress. He has no wheezes. He has no rales.  Abdominal: Soft. There is no tenderness.  Musculoskeletal: He exhibits no edema or tenderness.  Lymphadenopathy:    He has no cervical adenopathy.  Psychiatric:  Usual joking manner  Engages fairly well          Assessment & Plan:

## 2014-06-18 NOTE — Assessment & Plan Note (Signed)
BP Readings from Last 3 Encounters:  06/18/14 120/78  02/23/14 128/70  01/15/14 135/70   Still okay without meds

## 2014-06-18 NOTE — Assessment & Plan Note (Signed)
Mood has stablized Sleeping better here Not as anxious Will continue the citalopram and AM alprazolam---hasn't really needed the prn

## 2014-06-18 NOTE — Assessment & Plan Note (Signed)
Stools are more formed with cholestyramine

## 2014-07-03 ENCOUNTER — Telehealth: Payer: Self-pay | Admitting: Internal Medicine

## 2014-07-03 NOTE — Telephone Encounter (Signed)
[facility] Client TELEPHONE ADVICE RECORD [facility]  Patient Name: [name] [name]  Gender: Male  DOB: 09/27/1926   Age: 81 Y 8 M 16 D  Return Phone Number:   Address:   City/State/Zip: [place]    Client [facility]  [facility]  Physician Alphonsus SiasLetvak, Richard   Contact Type Fax  Call Type Triage / Clinical  Caller Name Tonia  Relationship To Patient Care Giver  Return Phone Number Unavailable  Chief Complaint Hoarseness (non urgent symptom)  Initial Comment [facility] @ [phone] states patient has cough, congestion and is hoarse     PreDisposition Call Doctor      Nurse Assessment  Nurse: Deloria LairHamilton, RN, Trish Date/Time Lamount Cohen(Eastern Time): 07/03/2014 2:45:03 PM  Please select the assessment type ---Standing order  Additional Documentation ---[facility]  Other current medications? ---Yes  List current medications. ---[phone] Fax [phone]  Medication allergies? ---Yes  List medication allergies. ---codine  Pharmacy name and phone number. ---[facility] [phone] Fax [phone]    Nurse: Deloria LairHamilton, RN, Trish Date/Time Lamount Cohen(Eastern Time): 07/03/2014 2:35:21 PM  Confirm and document reason for call. If symptomatic, describe symptoms. ---Care taker calling to report congestion horse voice and no temp. Eating without problems. voids and stools normal.  Has the patient traveled out of the country within the last 30 days? ---No  Does the patient require triage? ---Yes  Related visit to physician within the last 2 weeks? ---No  Does the PT have any chronic conditions? (i.e. diabetes, asthma, etc.) ---Yes  List chronic conditions. ---Essential hypertension, benign 81 wk Karie SchwalbeLetvak, Richard I, MD Overview Qualifier: Diagnosis of By: Pasty ArchLaws LPN, Regina VENOUS INSUFFICIENCY 81 yr Overview Qualifier: Diagnosis of By: Pasty ArchLaws LPN, Regina Digestive  GERD 81 yr Overview Qualifier: Diagnosis of By: Pasty ArchLaws LPN, Regina DIVERTICULOSIS, COLON, HX OF 81 yr Alison Murrayevine, Alma M, MD Overview Qualifier: Diagnosis of By: Pasty ArchLaws LPN, Regina Nervous and Auditory Alzheimer's dementia without behavioral disturbance 81 wk Karie SchwalbeLetvak, Richard I, MD Overview Qualifier: Diagnosis of By: Alphonsus SiasLetvak MD, Ronnette Hilaichard Ira Musculoskeletal and Integument Osteoarthritis, multiple sites 81 wk Karie SchwalbeLetvak, Richard I, MD Overview Qualifier: Diagnosis of By: Pasty ArchLaws LPN, Regina Other B12 DEFICIENCY 81 yr Alison Murrayevine, Alma M, MD Overview Qualifier: Diagnosis of By: Mills KollerWalsh, Terri Episodic mood disorder 81 wk Karie SchwalbeLetvak, Richard I, MD Overview Qualifier: Diagnosis of By: Alphonsus SiasLetvak MD, Ronnette Hilaichard Ira Delusions 81 mo Karie SchwalbeLetvak, Richard I, MD Diarrhea 81 wk Karie SchwalbeLetvak, Richard I, MD Counseling regarding advanced directives     Guidelines      Guideline Title Affirmed Question Affirmed Notes Nurse Date/Time Lamount Cohen(Eastern Time)  Cough - Acute Productive SEVERE coughing spells (e.g., whooping sound after coughing, vomiting after coughing)  Deloria LairHamilton, RN, Trish 07/03/2014 2:38:41 PM   Disp. Time Lamount Cohen(Eastern Time) Disposition Final User   07/03/2014 1:45:18 PM Send To Clinical Follow Up Rochel BromeQueue  McAtee, Shannon    07/03/2014 2:44:14 PM See Physician within 24 Hours Yes Deloria LairHamilton, RN, Trish        Caller Understands: Yes  Disagree/Comply: Comply     Care Advice Given Per Guideline    SEE PHYSICIAN WITHIN 24 HOURS: COUGHING SPELLS: * Drink warm fluids. Inhale warm mist. (Reason: both relax the airway and loosen up the phlegm) HUMIDIFIER: If the air is dry, use a humidifier in the bedroom. (Reason: dry air makes coughs worse) CALL BACK IF: * Difficulty breathing occurs * You become  worse. CARE ADVICE given per Cough - Acute Productive (Adult) guideline.       After Care Instructions Given     Call Event Type User Date / Time Description        Referrals  GO TO FACILITY REFUSED

## 2014-07-06 NOTE — Telephone Encounter (Signed)
Please check on him today with Eunice Blaseebbie or Claris CheMargaret Can add him on today if needed

## 2014-07-06 NOTE — Telephone Encounter (Signed)
Spoke with margaret and she states pt is doing better.

## 2014-07-13 ENCOUNTER — Telehealth: Payer: Self-pay | Admitting: *Deleted

## 2014-07-13 NOTE — Telephone Encounter (Signed)
PLEASE NOTE: All timestamps contained within this report are represented as [place] Standard Time. CONFIDENTIALTY NOTICE: This fax transmission is intended only for the addressee. It contains information that is legally privileged, confidential or otherwise protected from use or disclosure. If you are not the intended recipient, you are strictly prohibited from reviewing, disclosing, copying using or disseminating any of this information or taking any action in reliance on or regarding this information. If you have received this fax in error, please notify us immediately by telephone so that we can arrange for its return to us. Phone: [phone], Toll-Free: [phone], Fax: [phone] Page: 1 of 3 Call Id: [id number] [facility] Client TELEPHONE ADVICE RECORD [facility] Patient Name: [name] [name] Gender: Male DOB: 08/14/1926 Age: 81 Y 8 M 25 D Return Phone Number: [phone] (Primary), [phone] (Secondary) Address: City/State/Zip: [place] Client [facility] Physician Tillman AbideLetvak, Richard Contact Type Call Call Type Triage / Clinical Caller Name tara Relationship To Patient Spouse Return Phone Number [phone] (Primary) Chief Complaint Prescription Refill or Medication Request (non symptomatic) Initial Comment Caller states RX not at pharmacy really needs them can not wait Nurse Assessment Nurse: Jacqulyn Lineramos, Victoria Date/Time Lamount Cohen(Eastern Time): 07/03/2014 5:18:35 PM Confirm and document reason for call. If symptomatic, describe symptoms. ---Caller states RX not at pharmacy really needs them can not wait. Cough medicine Tussin. Has the patient traveled out of the country within the last 30 days? ---Not Applicable Does the patient require triage? ---No Please document clinical information provided and list any resource used. ---[facility]  [phone]. Nurse: Jacqulyn Lineramos, Victoria Date/Time Lamount Cohen(Eastern Time): 07/03/2014 5:43:08 PM Please select the assessment type ---Verbal order / New medication order Does the client directives allow for assistance with medications after hours? ---Yes Other current medications? ---No Medication allergies? ---Unknown Pharmacy name and phone number. ---[facility] [phone] Lovett Soxick Wagener RPH Does the client directive allow for RN to call in the medication order to the pharmacy? ---Yes Additional Documentation ---Tensalon Pearls 100mg  every 8 hours as needed for cough, #10 Guidelines Guideline Title Affirmed Question Affirmed Notes Nurse Date/Time (Eastern Time) Disp. Time Lamount Cohen(Eastern Time) Disposition Final User 07/03/2014 5:11:28 PM Send To RN Personal Josie SaundersGerard, RN, Jane 07/03/2014 5:53:52 PM Pharmacy Call Ethelene Halamos, Benetta SparVictoria PLEASE NOTE: All timestamps contained within this report are represented as [place] Standard Time. CONFIDENTIALTY NOTICE: This fax transmission is intended only for the addressee. It contains information that is legally privileged, confidential or otherwise protected from use or disclosure. If you are not the intended recipient, you are strictly prohibited from reviewing, disclosing, copying using or disseminating any of this information or taking any action in reliance on or regarding this information. If you have received this fax in error, please notify us immediately by telephone so that we can arrange for its return to us. Phone: [phone], Toll-Free: [phone], Fax: [phone] Page: 2 of 3 Call Id: [id number] Disp. Time Lamount Cohen(Eastern Time) Disposition Final User Reason: Lovett SoxRick Wagener, [facility] - verified they would be open for delivery 07/03/2014 5:54:53 PM Called On-Call Provider Jacqulyn Lineramos, Victoria Reason: Dr. Kriste BasqueHannah Kim - Verbal order for medication, tensalon Pearls. Call in to pharmacy [facility] [phone] 07/03/2014 5:56:02 PM Pharmacy Call Jacqulyn Lineramos, Victoria Reason: Spoke with  RPH, Lovett Soxick Wagener. Medication called in 100mg  every 8 hours PRN cough, Tensalon Pearls. Start when available from Pharmacy. #10 VO Dr. Kriste BasqueHannah Kim 07/12/2014 12:17:02 PM Clinical Call Yes Carleene OverlieMasterson, RN, [facility] After Care Instructions  Given Call Event Type User Date / Time Description Verbal Orders/Maintenance Medications Medication Refill Route Dosage Regime Duration Admin Instructions User Name Tensalon Pearls Oral 100 mg every 8 hours as needed for cough Hours  every 8 hours as needed for cough. Start when available from pharmacy. Jacqulyn Liner PLEASE NOTE: All timestamps contained within this report are represented as [place] Standard Time. CONFIDENTIALTY NOTICE: This fax transmission is intended only for the addressee. It contains information that is legally privileged, confidential or otherwise protected from use or disclosure. If you are not the intended recipient, you are strictly prohibited from reviewing, disclosing, copying using or disseminating any of this information or taking any action in reliance on or regarding this information. If you have received this fax in error, please notify us immediately by telephone so that we can arrange for its return to [place]. Phone: [phone], Toll-Free: [phone], Fax: [phone] Page: 3 of 3 Call Id: [id number] [facility] [address], [address] [place], [place] [address] [phone] Fax: [phone] MEDICATION ORDER [facility] Night - Client [facility] - Night Date: 07/12/2014 From: QI Department To: Tillman Abide Please sign the order for the approved drug(s) given by our call center nurse on your behalf. Fax to [phone] within 5 business days. Thank you. Date Lamount Cohen Time): 07/03/2014 4:02:48 PM Triage RN: Sylvie Farrier, RN NAME: [name] Sheldon PHONE NUMBER: [phone] (Primary), [phone] (Secondary) BIRTHDATE:  13-Oct-1926 ADDRESS: CITY/STATE/ZIP: [place] CALLER: Spouse NAME: tara Rx Given Medication Refill Route Dosage Regime Duration Admin Instructions Tensalon Pearls Oral 100 mg every 8 hours as needed for cough Hours  every 8 hours needed for cough. Start available from pharmacy. MD Signature Date

## 2014-07-13 NOTE — Telephone Encounter (Signed)
Please check to see how he is doing and to see if he needs more of the cough med

## 2014-07-13 NOTE — Telephone Encounter (Signed)
[facility]. Called x3 and line was busy all 3 times. Will try again later.

## 2014-07-14 NOTE — Telephone Encounter (Signed)
Spoke to Ameren CorporationDebbie [phone]) [facility] and was advised that the patient is doing better and she does not think that he needs any more cough medication. Eunice BlaseDebbie stated that she faxed paperwork over to you today and needs a refill on Ativan called to [facility].

## 2014-07-15 NOTE — Telephone Encounter (Signed)
I have alprazolam on list--not lorazepam Please confirm med and sig  Okay to fill #90 x 5 to [facility] for whichever he is really on

## 2014-07-16 ENCOUNTER — Encounter: Payer: Self-pay | Admitting: Podiatry

## 2014-07-16 ENCOUNTER — Ambulatory Visit: Payer: Medicare Other

## 2014-07-16 ENCOUNTER — Ambulatory Visit (INDEPENDENT_AMBULATORY_CARE_PROVIDER_SITE_OTHER): Payer: Medicare Other | Admitting: Podiatry

## 2014-07-16 ENCOUNTER — Ambulatory Visit (INDEPENDENT_AMBULATORY_CARE_PROVIDER_SITE_OTHER): Payer: Medicare Other

## 2014-07-16 DIAGNOSIS — R52 Pain, unspecified: Secondary | ICD-10-CM

## 2014-07-16 DIAGNOSIS — B351 Tinea unguium: Secondary | ICD-10-CM | POA: Diagnosis not present

## 2014-07-16 DIAGNOSIS — L84 Corns and callosities: Secondary | ICD-10-CM | POA: Diagnosis not present

## 2014-07-16 MED ORDER — ALPRAZOLAM 0.25 MG PO TABS: ORAL_TABLET | ORAL | Status: DC

## 2014-07-16 NOTE — Telephone Encounter (Signed)
[facility], pt is on alprazolam 0.25mg . I called rx in to [facility].

## 2014-07-20 NOTE — Progress Notes (Signed)
Patient ID: [name] [name], male   DOB: 01/29/1927, 81 y.o.   MRN: [id number]  Subjective: 81 year old male presents the office today with his wife. Per the patient's wife his facility states that he has been complaining of foot pain over the last several months. The patient does have Alzheimer's and he is unable to recall if he has any pain or is localized. Patient's wife denies any recent injury or trauma for which she is aware of. No other complaints at this time  Objective: Awake, NAD DP/PT pulses palpable, CRT less than 3 seconds Achilles tendon reflex intact bilaterally. There does not appear to be any areas of pinpoint bony tenderness or pain with vibratory sensation of bilateral lower extremities. There is no overlying edema, erythema, increase in warmth. Range of motion is intact and appears to be pain-free. There is hammertoe contractures identified. In the right second digit there is a thick hyperkeratotic lesion with evidence of dried blood underneath the lesion. Upon debridement there very superficial opening the skin was identified. There is no drainage, erythema or other clinical signs of infection. Nails are elongated, dystrophic, elongated, discolored. 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 No other open lesions or pre-ulcerative lesions identified. No pain with calf compression, swelling, warmth, erythema.  Assessment: 81 year old male with symptomatic onychomycosis, pre-ulcerative callus right second toe.  Plan; -X-rays were obtained and reviewed. There does not appear to be any acute fracture identified. -Hyperkeratotic lesions or pre-debrided without complications. Discussed with his wife to apply analytic ointment and a Band-Aid daily over this area and continue to monitor. Various pads were dispensed to help offload the area. -Nail sharply and debrided 9 without complication/bleeding. -Follow-up in 3 months or sooner should any problems  arise. In the meantime, occurs call the office with any questions, concerns, change in symptoms. Discussed that if the area on the right toes does not heal within 2 weeks to call the office for follow-up appointment.

## 2014-10-15 ENCOUNTER — Ambulatory Visit: Payer: Medicare Other | Admitting: Podiatry

## 2014-11-05 ENCOUNTER — Non-Acute Institutional Stay: Payer: Medicare Other | Admitting: Internal Medicine

## 2014-11-05 ENCOUNTER — Encounter: Payer: Self-pay | Admitting: Internal Medicine

## 2014-11-05 VITALS — BP 103/52 | HR 76 | Resp 16

## 2014-11-05 DIAGNOSIS — F0280 Dementia in other diseases classified elsewhere without behavioral disturbance: Secondary | ICD-10-CM

## 2014-11-05 DIAGNOSIS — F39 Unspecified mood [affective] disorder: Secondary | ICD-10-CM

## 2014-11-05 DIAGNOSIS — M159 Polyosteoarthritis, unspecified: Secondary | ICD-10-CM

## 2014-11-05 DIAGNOSIS — M150 Primary generalized (osteo)arthritis: Secondary | ICD-10-CM | POA: Diagnosis not present

## 2014-11-05 DIAGNOSIS — F22 Delusional disorders: Secondary | ICD-10-CM | POA: Diagnosis not present

## 2014-11-05 DIAGNOSIS — G309 Alzheimer's disease, unspecified: Secondary | ICD-10-CM | POA: Diagnosis not present

## 2014-11-05 DIAGNOSIS — R197 Diarrhea, unspecified: Secondary | ICD-10-CM | POA: Diagnosis not present

## 2014-11-05 NOTE — Assessment & Plan Note (Signed)
Okay with the tylenol regularly

## 2014-11-05 NOTE — Assessment & Plan Note (Signed)
Fairly stable Doing okay in AL setting in memory care Moderate care needs

## 2014-11-05 NOTE — Progress Notes (Signed)
Subjective:    Patient ID: [name] [name], male    DOB: 12/07/1926, 81 y.o.   MRN: [id number]  HPI Visit for follow up of chronic medical conditions Reviewed status with unit coordinator Trinna PostAlex Wife is here  He has been doing well here No new concerns Continues to walk around but having more trouble getting out of a chair No recent falls--but has had in the past year Staff bring him to bathroom ---but still incontinent Needs help with dressing and bathing Feeds himself Sometimes doesn't know who wife is  Mood has been good No major anxiety Doesn't seem depressed now Hasn't needed the prn alprazolam for the most part  Bowels are still frequent and loose Not as bad as in the past  Some delusions Trouble "getting things figured out" per wife Thinks he is still working  Still on arthritis tylenol Seems to be controlled  Current Outpatient Prescriptions on File Prior to Visit  Medication Sig Dispense Refill  . ALPRAZolam (XANAX) 0.25 MG tablet 1 tablet each morning AND  1 tablet by mouth three times a day if needed 90 tablet 5  . cholestyramine light (PREVALITE) 4 G packet Take 4 g by mouth 2 (two) times daily.    . citalopram (CELEXA) 20 MG tablet take 1 tablet by mouth once daily for anxiety 30 tablet 11  . ketoconazole (NIZORAL) 2 % cream   98  . loperamide (IMODIUM) 2 MG capsule Take 2 mg by mouth daily. Repeat ~noon if still having loose stools    . MAPAP ARTHRITIS PAIN 650 MG CR tablet TAKE ONE TABLET BY MOUTH 3 TIMES A DAY *DO NOT CRUSH* 90 tablet 11  . vitamin B-12 (CYANOCOBALAMIN) 1000 MCG tablet Take 1,000 mcg by mouth daily.     No current facility-administered medications on file prior to visit.    Allergies  Allergen Reactions  . Codeine Other (See Comments)    Other Reaction: Not Assessed    Past Medical History  Diagnosis Date  . GERD (gastroesophageal reflux disease)   . Hypertension   . Anxiety   . Alzheimer disease   . DIVERTICULOSIS, COLON, HX  OF 09/19/2006  . ARTHRITIS 09/19/2006  . VENOUS INSUFFICIENCY 09/19/2006  . HYPERTENSION 09/19/2006  . ALZHEIMERS DISEASE 09/25/2008  . B12 DEFICIENCY 11/23/2008    Past Surgical History  Procedure Laterality Date  . Appendectomy  1980  . Eye surgery  2005    laser eye  . Back surgery  1982, 1992  . Shoulder surgery  2004    right  . Cataract extraction, bilateral  2004    Family History  Problem Relation Age of Onset  . Asthma Mother   . Alzheimer's disease Mother   . Kidney disease Father   . COPD Brother   . Alzheimer's disease Brother   . Arthritis Brother     History   Social History  . Marital Status: Married    Spouse Name: N/A  . Number of Children: 2  . Years of Education: N/A   Occupational History  . grading work    Social History Main Topics  . Smoking status: Former Smoker -- 50 years    Types: Cigarettes  . Smokeless tobacco: Never Used  . Alcohol Use: No  . Drug Use: No  . Sexual Activity: No   Other Topics Concern  . Not on file   Social History Narrative   Wife is health care POA   DNR order done 06/18/14  No prolonged ventilation and no tube feeds   Review of Systems Appetite is good Weight seems stable--no recent weight this month No SOB, chest pain, apparent dizziness Sleeps well    Objective:   Physical Exam  Constitutional: He appears well-developed. No distress.  Neck: Normal range of motion. Neck supple. No thyromegaly present.  Cardiovascular: Normal rate, regular rhythm and normal heart sounds.  Exam reveals no gallop.   No murmur heard. Pulmonary/Chest: Effort normal and breath sounds normal. No respiratory distress. He has no wheezes. He has no rales.  Abdominal: Soft. There is no tenderness.  Musculoskeletal: He exhibits no edema or tenderness.  Lymphadenopathy:    He has no cervical adenopathy.  Neurological:  Engages but repetitive speech and not really on task  Psychiatric:  Upbeat and pleasant          Assessment  & Plan:

## 2014-11-05 NOTE — Assessment & Plan Note (Signed)
Seems level  Will continue on citalopram for now

## 2014-11-05 NOTE — Assessment & Plan Note (Signed)
Doing okay on current regimen 

## 2014-11-05 NOTE — Assessment & Plan Note (Signed)
Persist but no agitation No Rx  needed

## 2014-11-18 ENCOUNTER — Emergency Department
Admission: EM | Admit: 2014-11-18 | Discharge: 2014-11-19 | Disposition: A | Discharge status: Home / Self Care | Payer: Medicare Other | Attending: Emergency Medicine | Admitting: Emergency Medicine

## 2014-11-18 ENCOUNTER — Other Ambulatory Visit: Payer: Self-pay

## 2014-11-18 ENCOUNTER — Encounter: Payer: Self-pay | Admitting: *Deleted

## 2014-11-18 DIAGNOSIS — I10 Essential (primary) hypertension: Secondary | ICD-10-CM | POA: Diagnosis not present

## 2014-11-18 DIAGNOSIS — R4182 Altered mental status, unspecified: Secondary | ICD-10-CM | POA: Insufficient documentation

## 2014-11-18 DIAGNOSIS — R401 Stupor: Secondary | ICD-10-CM | POA: Diagnosis not present

## 2014-11-18 DIAGNOSIS — Z87891 Personal history of nicotine dependence: Secondary | ICD-10-CM | POA: Insufficient documentation

## 2014-11-18 DIAGNOSIS — Z8659 Personal history of other mental and behavioral disorders: Secondary | ICD-10-CM | POA: Diagnosis not present

## 2014-11-18 DIAGNOSIS — Z79899 Other long term (current) drug therapy: Secondary | ICD-10-CM | POA: Diagnosis not present

## 2014-11-18 NOTE — ED Provider Notes (Signed)
[facility] Emergency Department Provider Note  ____________________________________________  Time seen: 11:35 PM  I have reviewed the triage vital signs and the nursing notes.   HISTORY  Chief Complaint Altered Mental Status      HPI [name] [name] is a 81 y.o. male present with altered mental status noted tonight. Patient is a resident of [facility]. Per family patient has had no changes in his mentation over the past few days. Although the patient has a history of dementia and this is been a notable change per the family. Family bedside states patient has complained of some lower abdominal discomfort over the past few days as well currently patient has no complaints     Past Medical History  Diagnosis Date  . GERD (gastroesophageal reflux disease)   . Hypertension   . Anxiety   . Alzheimer disease   . DIVERTICULOSIS, COLON, HX OF 09/19/2006  . ARTHRITIS 09/19/2006  . VENOUS INSUFFICIENCY 09/19/2006  . HYPERTENSION 09/19/2006  . ALZHEIMERS DISEASE 09/25/2008  . B12 DEFICIENCY 11/23/2008    Patient Active Problem List   Diagnosis Date Noted  . Counseling regarding advanced directives 01/15/2014  . Diarrhea 12/30/2013  . Delusions 10/02/2013  . Episodic mood disorder 12/09/2009  . B12 DEFICIENCY 11/23/2008  . Alzheimer's dementia without behavioral disturbance 09/25/2008  . Essential hypertension, benign 09/19/2006  . VENOUS INSUFFICIENCY 09/19/2006  . Osteoarthritis, multiple sites 09/19/2006  . DIVERTICULOSIS, COLON, HX OF 09/19/2006  . GERD 03/01/2005    Past Surgical History  Procedure Laterality Date  . Appendectomy  1980  . Eye surgery  2005    laser eye  . Back surgery  1982, 1992  . Shoulder surgery  2004    right  . Cataract extraction, bilateral  2004    Current Outpatient Rx  Name  Route  Sig  Dispense  Refill  . ALPRAZolam (XANAX) 0.25 MG tablet      1 tablet each morning AND  1 tablet by mouth three times a  day if needed   90 tablet   5   . cholestyramine light (PREVALITE) 4 G packet   Oral   Take 4 g by mouth 2 (two) times daily.         . citalopram (CELEXA) 20 MG tablet      take 1 tablet by mouth once daily for anxiety   30 tablet   11   . ketoconazole (NIZORAL) 2 % cream            98   . loperamide (IMODIUM) 2 MG capsule   Oral   Take 2 mg by mouth daily. Repeat ~noon if still having loose stools         . MAPAP ARTHRITIS PAIN 650 MG CR tablet      TAKE ONE TABLET BY MOUTH 3 TIMES A DAY *DO NOT CRUSH*   90 tablet   11   . vitamin B-12 (CYANOCOBALAMIN) 1000 MCG tablet   Oral   Take 1,000 mcg by mouth daily.           Allergies Codeine  Family History  Problem Relation Age of Onset  . Asthma Mother   . Alzheimer's disease Mother   . Kidney disease Father   . COPD Brother   . Alzheimer's disease Brother   . Arthritis Brother     Social History History  Substance Use Topics  . Smoking status: Former Smoker -- 50 years    Types: Cigarettes  .  Smokeless tobacco: Never Used  . Alcohol Use: No    Review of Systems  Constitutional: Negative for fever. Eyes: Negative for visual changes. ENT: Negative for sore throat. Cardiovascular: Negative for chest pain. Respiratory: Negative for shortness of breath. Gastrointestinal: Negative for abdominal pain, vomiting and diarrhea. Genitourinary: Negative for dysuria. Musculoskeletal: Negative for back pain. Skin: Negative for rash. Neurological: Positive for altered mental status   10-point ROS otherwise negative.  ____________________________________________   PHYSICAL EXAM:  VITAL SIGNS: ED Triage Vitals  Enc Vitals Group     BP --      Pulse Rate 11/18/14 2238 60     Resp 11/18/14 2238 22     Temp 11/18/14 2238 97.7 F (36.5 C)     Temp Source 11/18/14 2238 Oral     SpO2 11/18/14 2238 97 %     Weight 11/18/14 2238 200 lb (90.719 kg)     Height 11/18/14 2238 6' (1.829 m)     Head Cir  --      Peak Flow --      Pain Score --      Pain Loc --      Pain Edu? --      Excl. in GC? --      Constitutional: Alert and oriented. Well appearing and in no distress. Eyes: Conjunctivae are normal. PERRL. Normal extraocular movements. ENT   Head: Normocephalic and atraumatic.   Nose: No congestion/rhinnorhea.   Mouth/Throat: Mucous membranes are moist.   Neck: No stridor. Cardiovascular: Normal rate, regular rhythm. Normal and symmetric distal pulses are present in all extremities. No murmurs, rubs, or gallops. Respiratory: Normal respiratory effort without tachypnea nor retractions. Breath sounds are clear and equal bilaterally. No wheezes/rales/rhonchi. Gastrointestinal: Soft and nontender. No distention. There is no CVA tenderness. Genitourinary: deferred Musculoskeletal: Nontender with normal range of motion in all extremities. No joint effusions.  No lower extremity tenderness nor edema. Neurologic:  Normal speech and language. No gross focal neurologic deficits are appreciated. Speech is normal.  Skin:  Skin is warm, dry and intact. No rash noted. Psychiatric: Mood and affect are normal. Speech and behavior are normal. Patient exhibits appropriate insight and judgment.  ____________________________________________    LABS (pertinent positives/negatives)  Labs Reviewed  CBC - Abnormal; Notable for the following:    RBC 4.35 (*)    All other components within normal limits  COMPREHENSIVE METABOLIC PANEL - Abnormal; Notable for the following:    Total Protein 6.4 (*)    ALT 12 (*)    All other components within normal limits  URINALYSIS COMPLETEWITH MICROSCOPIC ([facility] ONLY) - Abnormal; Notable for the following:    Color, Urine YELLOW (*)    APPearance CLEAR (*)    Bacteria, UA RARE (*)    All other components within normal limits     ______  RADIOLOGY  CT head negative for acute pathology per  radiologist  ____________________________________________      INITIAL IMPRESSION / ASSESSMENT AND PLAN / ED COURSE  Pertinent labs & imaging results that were available during my care of the patient were reviewed by me and considered in my medical decision making (see chart for details).  History of physical exam CT scan lab data revealed no clear etiology for patient's altered mental status. While in the emergency department patient remained at his baseline mentation throughout per family  ____________________________________________   FINAL CLINICAL IMPRESSION(S) / ED DIAGNOSES  Final diagnoses:  Altered mental status, unspecified altered mental status type  Darci Current, MD 11/20/14 [phone]

## 2014-11-18 NOTE — ED Notes (Signed)
[name]. Per EMS staff at residence found pt unresponsive, unable to arouse. Per EMS pt responded to painful stimuli. Pt is AAO per baseline dementia at this time. Vitals wnl at this time. Pt denies any pain.

## 2014-11-19 ENCOUNTER — Emergency Department: Payer: Medicare Other

## 2014-11-19 DIAGNOSIS — R4182 Altered mental status, unspecified: Secondary | ICD-10-CM | POA: Diagnosis not present

## 2014-11-19 DIAGNOSIS — Z8659 Personal history of other mental and behavioral disorders: Secondary | ICD-10-CM | POA: Diagnosis not present

## 2014-11-19 LAB — COMPREHENSIVE METABOLIC PANEL
ALT: 12 U/L — AB (ref 17–63)
AST: 19 U/L (ref 15–41)
Albumin: 3.9 g/dL (ref 3.5–5.0)
Alkaline Phosphatase: 63 U/L (ref 38–126)
Anion gap: 9 (ref 5–15)
BUN: 17 mg/dL (ref 6–20)
CALCIUM: 9.1 mg/dL (ref 8.9–10.3)
CO2: 24 mmol/L (ref 22–32)
CREATININE: 0.75 mg/dL (ref 0.61–1.24)
Chloride: 111 mmol/L (ref 101–111)
Glucose, Bld: 89 mg/dL (ref 65–99)
POTASSIUM: 3.9 mmol/L (ref 3.5–5.1)
Sodium: 144 mmol/L (ref 135–145)
Total Bilirubin: 0.9 mg/dL (ref 0.3–1.2)
Total Protein: 6.4 g/dL — ABNORMAL LOW (ref 6.5–8.1)

## 2014-11-19 LAB — CBC
HCT: 41.9 % (ref 40.0–52.0)
Hemoglobin: 14.4 g/dL (ref 13.0–18.0)
MCH: 33.1 pg (ref 26.0–34.0)
MCHC: 34.3 g/dL (ref 32.0–36.0)
MCV: 96.4 fL (ref 80.0–100.0)
PLATELETS: 193 10*3/uL (ref 150–440)
RBC: 4.35 MIL/uL — AB (ref 4.40–5.90)
RDW: 13.3 % (ref 11.5–14.5)
WBC: 6.7 10*3/uL (ref 3.8–10.6)

## 2014-11-19 LAB — URINALYSIS COMPLETE WITH MICROSCOPIC (ARMC ONLY)
Bilirubin Urine: NEGATIVE
Glucose, UA: NEGATIVE mg/dL
Hgb urine dipstick: NEGATIVE
Ketones, ur: NEGATIVE mg/dL
Leukocytes, UA: NEGATIVE
NITRITE: NEGATIVE
PH: 5 (ref 5.0–8.0)
PROTEIN: NEGATIVE mg/dL
Specific Gravity, Urine: 1.019 (ref 1.005–1.030)
Squamous Epithelial / LPF: NONE SEEN

## 2014-11-19 NOTE — Discharge Instructions (Signed)

## 2014-11-19 NOTE — ED Notes (Signed)
Report called to Marily Memos, Med Tech at [facility] in [place],  She states that she will send someone over to pick pt up for transport back to facility.

## 2014-12-15 ENCOUNTER — Telehealth: Payer: Self-pay

## 2014-12-15 NOTE — Telephone Encounter (Signed)
Algis GreenhouseAlex King [facility] left v/m requesting cb. Spoke with Trinna PostAlex; for 3 days pt complaining of pain lower left side of torso; had bulge there on 12/13/14 but no bulge seen since 12/13/14. No N &V and no urinary symptoms. No fever. Alex request cb with what to do. Pts wife cannot bring to the office and alex wants to know if needs to send out for eval. Alex request cb. If pt condition changes or worsens prior to cb Trinna Postlex will call [facility] back.

## 2014-12-15 NOTE — Telephone Encounter (Signed)
He has a known ventral hernia that he will complain about at times. If this is intermittent (and he still eats), no evaluation is needed. If it is more persistent they should arrange to bring him here for evaluation (ER is not appropriate)

## 2014-12-15 NOTE — Telephone Encounter (Signed)
Spoke with [name] [name] and advised results, she will let pt's wife know.

## 2014-12-23 ENCOUNTER — Ambulatory Visit (INDEPENDENT_AMBULATORY_CARE_PROVIDER_SITE_OTHER): Payer: Medicare Other | Admitting: Internal Medicine

## 2014-12-23 ENCOUNTER — Encounter: Payer: Self-pay | Admitting: Internal Medicine

## 2014-12-23 VITALS — BP 120/70 | HR 68 | Temp 98.1°F | Wt 201.0 lb

## 2014-12-23 DIAGNOSIS — F0280 Dementia in other diseases classified elsewhere without behavioral disturbance: Secondary | ICD-10-CM

## 2014-12-23 DIAGNOSIS — G309 Alzheimer's disease, unspecified: Secondary | ICD-10-CM

## 2014-12-23 DIAGNOSIS — F39 Unspecified mood [affective] disorder: Secondary | ICD-10-CM | POA: Diagnosis not present

## 2014-12-23 DIAGNOSIS — R1032 Left lower quadrant pain: Secondary | ICD-10-CM

## 2014-12-23 NOTE — Assessment & Plan Note (Signed)
Vague and transient No apparent GI problems now Observation only

## 2014-12-23 NOTE — Progress Notes (Signed)
Pre visit review using our clinic review tool, if applicable. No additional management support is needed unless otherwise documented below in the visit note. 

## 2014-12-23 NOTE — Progress Notes (Signed)
Subjective:    Patient ID: [name] [name], male    DOB: 10/16/1926, 81 y.o.   MRN: [id number]  HPI Here with wife Has complaints of lower abdominal pain at times ?from known ventral hernia Seems to be sleeping more--- most of the day sometimes Not walking as well Has walker but just leaves it and doesn't use  Appetite is very good--eating all the time No vomiting Bowels still loose--no sig change  Mood has been okay Still gets alprazolam every morning  Current Outpatient Prescriptions on File Prior to Visit  Medication Sig Dispense Refill  . ALPRAZolam (XANAX) 0.25 MG tablet 1 tablet each morning AND  1 tablet by mouth three times a day if needed 90 tablet 5  . benzonatate (TESSALON) 100 MG capsule Take 100 mg by mouth every 8 (eight) hours as needed for cough.    . cholestyramine light (PREVALITE) 4 G packet Take 4 g by mouth 2 (two) times daily.    . citalopram (CELEXA) 20 MG tablet take 1 tablet by mouth once daily for anxiety 30 tablet 11  . guaifenesin (ROBITUSSIN) 100 MG/5ML syrup Take 200 mg by mouth every 6 (six) hours as needed for cough.    . loperamide (IMODIUM) 2 MG capsule Take 2 mg by mouth daily. Repeat ~noon if still having loose stools    . MAPAP ARTHRITIS PAIN 650 MG CR tablet TAKE ONE TABLET BY MOUTH 3 TIMES A DAY *DO NOT CRUSH* 90 tablet 11  . vitamin B-12 (CYANOCOBALAMIN) 1000 MCG tablet Take 1,000 mcg by mouth daily.     No current facility-administered medications on file prior to visit.    Allergies  Allergen Reactions  . Codeine Other (See Comments)    Other Reaction: Not Assessed    Past Medical History  Diagnosis Date  . GERD (gastroesophageal reflux disease)   . Hypertension   . Anxiety   . Alzheimer disease   . DIVERTICULOSIS, COLON, HX OF 09/19/2006  . ARTHRITIS 09/19/2006  . VENOUS INSUFFICIENCY 09/19/2006  . HYPERTENSION 09/19/2006  . ALZHEIMERS DISEASE 09/25/2008  . B12 DEFICIENCY 11/23/2008    Past Surgical History  Procedure  Laterality Date  . Appendectomy  1980  . Eye surgery  2005    laser eye  . Back surgery  1982, 1992  . Shoulder surgery  2004    right  . Cataract extraction, bilateral  2004    Family History  Problem Relation Age of Onset  . Asthma Mother   . Alzheimer's disease Mother   . Kidney disease Father   . COPD Brother   . Alzheimer's disease Brother   . Arthritis Brother     History   Social History  . Marital Status: Married    Spouse Name: N/A  . Number of Children: 2  . Years of Education: N/A   Occupational History  . grading work    Social History Main Topics  . Smoking status: Former Smoker -- 50 years    Types: Cigarettes  . Smokeless tobacco: Never Used  . Alcohol Use: No  . Drug Use: No  . Sexual Activity: No   Other Topics Concern  . Not on file   Social History Narrative   Wife is health care POA   DNR order done 06/18/14   No prolonged ventilation and no tube feeds    Review of Systems No fever Rare cough but no breathing problems    Objective:   Physical Exam  Constitutional: He appears  well-developed. No distress.  Abdominal: Soft. He exhibits no distension. There is no tenderness. There is no rebound and no guarding.  Genitourinary:  No inguinal hernia of note  Psychiatric:  Cooperative Confabulates as usual          Assessment & Plan:

## 2014-12-23 NOTE — Assessment & Plan Note (Signed)
Sleeping more and declining Will stop the AM alprazolam Consider decreasing citalopram at next visit

## 2014-12-23 NOTE — Assessment & Plan Note (Signed)
Continued decline and more dependent

## 2015-01-27 ENCOUNTER — Telehealth: Payer: Self-pay | Admitting: Internal Medicine

## 2015-01-27 NOTE — Telephone Encounter (Signed)
granddaughter called, wanted to make sure that pt could get a FL2 for for skilled nursing.   cb number [phone]

## 2015-01-28 NOTE — Telephone Encounter (Signed)
If he is being transferred from [facility] probably can do it there. But I can do it also if needed (I need to sign either way)

## 2015-01-28 NOTE — Telephone Encounter (Signed)
Spoke with [name] (granddaughter) and advised results

## 2015-03-04 ENCOUNTER — Encounter: Payer: Self-pay | Admitting: Internal Medicine

## 2015-03-04 ENCOUNTER — Non-Acute Institutional Stay: Payer: Medicare Other | Admitting: Internal Medicine

## 2015-03-04 VITALS — BP 115/70 | HR 70 | Resp 18 | Wt 202.0 lb

## 2015-03-04 DIAGNOSIS — G309 Alzheimer's disease, unspecified: Secondary | ICD-10-CM | POA: Diagnosis not present

## 2015-03-04 DIAGNOSIS — M159 Polyosteoarthritis, unspecified: Secondary | ICD-10-CM

## 2015-03-04 DIAGNOSIS — M150 Primary generalized (osteo)arthritis: Secondary | ICD-10-CM

## 2015-03-04 DIAGNOSIS — I10 Essential (primary) hypertension: Secondary | ICD-10-CM

## 2015-03-04 DIAGNOSIS — F39 Unspecified mood [affective] disorder: Secondary | ICD-10-CM

## 2015-03-04 DIAGNOSIS — R197 Diarrhea, unspecified: Secondary | ICD-10-CM

## 2015-03-04 DIAGNOSIS — F0280 Dementia in other diseases classified elsewhere without behavioral disturbance: Secondary | ICD-10-CM

## 2015-03-04 MED ORDER — CITALOPRAM HYDROBROMIDE 10 MG PO TABS: 10.0000 mg | ORAL_TABLET | Freq: Every day | ORAL | Status: DC

## 2015-03-04 NOTE — Assessment & Plan Note (Signed)
Moderate May need SNF due to increased care needs and incontinence Wife is looking into [facility]

## 2015-03-04 NOTE — Assessment & Plan Note (Signed)
Mood seems more neutral and he is more used to it here Will try to wean the citalopram

## 2015-03-04 NOTE — Assessment & Plan Note (Signed)
Fair control with the regular imodium

## 2015-03-04 NOTE — Assessment & Plan Note (Signed)
Seems to be okay with the acetaminophen but more problems transferring out of chair, etc No change for now

## 2015-03-04 NOTE — Assessment & Plan Note (Signed)
BP Readings from Last 3 Encounters:  03/04/15 115/70  12/23/14 120/70  11/19/14 136/89   Still fine without meds

## 2015-03-04 NOTE — Progress Notes (Signed)
Subjective:    Patient ID: [name] [name], male    DOB: December 25, 1926, 81 y.o.   MRN: [id number]  HPI Visit for follow up of dementia and other medical conditions Reviewed status with Margaret--supervisor here Wife is here  Doing okay No new concerns from staff here Needs help with showering and dressing Will initiate bathroom with help--but still incontinent at times  Has some pain in feet and otherwise More trouble getting up and around Won't use walker-- no recent falls though Pain does seem to be adequately controlled with the acetaminophen  Still very loquacious Lots of questions, confused about where he is, etc Not clearly  Still moves bowels regularly Still mostly loose and some gas Diarrhea seems controlled with the imodium  Current Outpatient Prescriptions on File Prior to Visit  Medication Sig Dispense Refill  . cholestyramine light (PREVALITE) 4 G packet Take 4 g by mouth 2 (two) times daily.    . citalopram (CELEXA) 20 MG tablet take 1 tablet by mouth once daily for anxiety 30 tablet 11  . guaifenesin (ROBITUSSIN) 100 MG/5ML syrup Take 200 mg by mouth every 6 (six) hours as needed for cough.    . loperamide (IMODIUM) 2 MG capsule Take 2 mg by mouth daily. Repeat ~noon if still having loose stools    . MAPAP ARTHRITIS PAIN 650 MG CR tablet TAKE ONE TABLET BY MOUTH 3 TIMES A DAY *DO NOT CRUSH* 90 tablet 11  . vitamin B-12 (CYANOCOBALAMIN) 1000 MCG tablet Take 1,000 mcg by mouth daily.     No current facility-administered medications on file prior to visit.    Allergies  Allergen Reactions  . Codeine Other (See Comments)    Other Reaction: Not Assessed    Past Medical History  Diagnosis Date  . GERD (gastroesophageal reflux disease)   . Hypertension   . Anxiety   . Alzheimer disease   . DIVERTICULOSIS, COLON, HX OF 09/19/2006  . ARTHRITIS 09/19/2006  . VENOUS INSUFFICIENCY 09/19/2006  . HYPERTENSION 09/19/2006  . ALZHEIMERS DISEASE 09/25/2008  . B12 DEFICIENCY  11/23/2008    Past Surgical History  Procedure Laterality Date  . Appendectomy  1980  . Eye surgery  2005    laser eye  . Back surgery  1982, 1992  . Shoulder surgery  2004    right  . Cataract extraction, bilateral  2004    Family History  Problem Relation Age of Onset  . Asthma Mother   . Alzheimer's disease Mother   . Kidney disease Father   . COPD Brother   . Alzheimer's disease Brother   . Arthritis Brother     Social History   Social History  . Marital Status: Married    Spouse Name: N/A  . Number of Children: 2  . Years of Education: N/A   Occupational History  . grading work    Social History Main Topics  . Smoking status: Former Smoker -- 50 years    Types: Cigarettes  . Smokeless tobacco: Never Used  . Alcohol Use: No  . Drug Use: No  . Sexual Activity: No   Other Topics Concern  . Not on file   Social History Narrative   Wife is health care POA   DNR order done 06/18/14   No prolonged ventilation and no tube feeds   Review of Systems Appetite is good Weight stable Sleeps well Using cream on his bottom--- still stays red but no ulcers. Staff use 2 pull ups at a time  to ease changes    Objective:   Physical Exam  Constitutional: He appears well-developed and well-nourished. No distress.  Neck: Normal range of motion. Neck supple. No thyromegaly present.  Cardiovascular: Normal rate, regular rhythm and normal heart sounds.  Exam reveals no gallop.   No murmur heard. Pulmonary/Chest: Breath sounds normal. No respiratory distress. He has no wheezes. He has no rales.  Abdominal: Soft. There is no tenderness.  Musculoskeletal: He exhibits no edema or tenderness.  Lymphadenopathy:    He has no cervical adenopathy.  Psychiatric:  Mood is neutral--affect appropriate          Assessment & Plan:

## 2015-03-23 ENCOUNTER — Telehealth: Payer: Self-pay | Admitting: Internal Medicine

## 2015-03-23 NOTE — Telephone Encounter (Signed)
Message left Will have to try back tomorrow

## 2015-03-23 NOTE — Telephone Encounter (Signed)
Wife called - she is wanting FL2 filled out. Please call wife back [phone] thank you

## 2015-03-24 DIAGNOSIS — Z0279 Encounter for issue of other medical certificate: Secondary | ICD-10-CM

## 2015-03-24 NOTE — Telephone Encounter (Signed)
Form done Let wife know it is ready  $20 charge

## 2015-03-24 NOTE — Telephone Encounter (Signed)
I notified patient's wife form is ready and of charge.

## 2015-03-26 DIAGNOSIS — Z23 Encounter for immunization: Secondary | ICD-10-CM | POA: Diagnosis not present

## 2015-04-20 ENCOUNTER — Telehealth: Payer: Self-pay | Admitting: *Deleted

## 2015-04-20 NOTE — Telephone Encounter (Signed)
Granddaughter casey calling asking for another FL-2 form, she stated that the original was sent to social services so that pt can get medicaid. She also states that the "NUTRITION STATUS" was not filled out, I advised that pt is on a regular diet ( per [facility]) she stated Dr.Letvak needs to check the "diet" box and right in regular and also they need the height and weight, per social services. Granddaughter states they are still looking for a place for pt that has a bed available for skilled nursing and she would need the original FL-2.  ( see copy for fl-2 on your desk)

## 2015-04-21 NOTE — Telephone Encounter (Signed)
Form redone No charge

## 2015-04-21 NOTE — Telephone Encounter (Signed)
I left a message on [name] [name]'s voice mail form ready for pick up.

## 2015-06-03 ENCOUNTER — Telehealth: Payer: Self-pay | Admitting: Internal Medicine

## 2015-06-03 NOTE — Telephone Encounter (Signed)
Note from director Claris CheMargaret Bad congestion without fever--hard to breathe from the congestion Cough--robitussin not really helping  Spoke to [place] Only sick since yesterday Lots of runny nose Probably the viral infection going around---if worsens, will need to be seen

## 2015-06-09 ENCOUNTER — Telehealth: Payer: Self-pay

## 2015-06-09 NOTE — Telephone Encounter (Signed)
Ms Harlon FlorWhitaker said [facility] and wants to know [facility]. Advised Ms Harlon FlorWhitaker to ck with [facility] and see what is their policy and if any refills needed she will contact [facility].

## 2015-07-15 ENCOUNTER — Encounter: Payer: Self-pay | Admitting: Internal Medicine

## 2015-07-15 ENCOUNTER — Non-Acute Institutional Stay: Payer: Medicare Other | Admitting: Internal Medicine

## 2015-07-15 VITALS — BP 140/78 | HR 70 | Temp 98.1°F | Resp 18 | Wt 205.0 lb

## 2015-07-15 DIAGNOSIS — F22 Delusional disorders: Secondary | ICD-10-CM | POA: Diagnosis not present

## 2015-07-15 DIAGNOSIS — F39 Unspecified mood [affective] disorder: Secondary | ICD-10-CM

## 2015-07-15 DIAGNOSIS — G309 Alzheimer's disease, unspecified: Secondary | ICD-10-CM

## 2015-07-15 DIAGNOSIS — I10 Essential (primary) hypertension: Secondary | ICD-10-CM

## 2015-07-15 DIAGNOSIS — M159 Polyosteoarthritis, unspecified: Secondary | ICD-10-CM

## 2015-07-15 DIAGNOSIS — M150 Primary generalized (osteo)arthritis: Secondary | ICD-10-CM

## 2015-07-15 DIAGNOSIS — F0280 Dementia in other diseases classified elsewhere without behavioral disturbance: Secondary | ICD-10-CM

## 2015-07-15 NOTE — Assessment & Plan Note (Signed)
BP Readings from Last 3 Encounters:  07/15/15 140/78  03/04/15 115/70  12/23/14 120/70   Fine without meds

## 2015-07-15 NOTE — Progress Notes (Signed)
Subjective:    Patient ID: [name] [name], male    DOB: Nov 06, 1926, 82 y.o.   MRN: [id number]  HPI Visit for review of dementia and other chronic medical conditions Reviewed status with [name] [name] the unit manager Wife is here as [name] [name]  Doing fairly well Still walks himself Feeds himself with good appetite--but increasing problems using utensils. Needs some prompting at times  Needs help getting to bathroom--- occasional incontinence Does need help dressing and bathing  Still moves bowels a lot--despite the meds Passes a lot of gas No worse though  Mood generally okay Continues on the medication  Didn't qualify for Medicaid Children helping with loan to keep him here Wife may need to eventually take him home  Current Outpatient Prescriptions on File Prior to Visit  Medication Sig Dispense Refill  . cholestyramine light (PREVALITE) 4 G packet Take 4 g by mouth 2 (two) times daily.    . citalopram (CELEXA) 10 MG tablet Take 1 tablet (10 mg total) by mouth daily. 30 tablet 11  . guaifenesin (ROBITUSSIN) 100 MG/5ML syrup Take 200 mg by mouth every 6 (six) hours as needed for cough.    . loperamide (IMODIUM) 2 MG capsule Take 2 mg by mouth daily. Repeat ~noon if still having loose stools    . MAPAP ARTHRITIS PAIN 650 MG CR tablet TAKE ONE TABLET BY MOUTH 3 TIMES A DAY *DO NOT CRUSH* 90 tablet 11  . vitamin B-12 (CYANOCOBALAMIN) 1000 MCG tablet Take 1,000 mcg by mouth daily.     No current facility-administered medications on file prior to visit.    Allergies  Allergen Reactions  . Codeine Other (See Comments)    Other Reaction: Not Assessed    Past Medical History  Diagnosis Date  . GERD (gastroesophageal reflux disease)   . Hypertension   . Anxiety   . Alzheimer disease   . DIVERTICULOSIS, COLON, HX OF 09/19/2006  . ARTHRITIS 09/19/2006  . VENOUS INSUFFICIENCY 09/19/2006  . HYPERTENSION 09/19/2006  . ALZHEIMERS DISEASE 09/25/2008  . B12 DEFICIENCY 11/23/2008    Past  Surgical History  Procedure Laterality Date  . Appendectomy  1980  . Eye surgery  2005    laser eye  . Back surgery  1982, 1992  . Shoulder surgery  2004    right  . Cataract extraction, bilateral  2004    Family History  Problem Relation Age of Onset  . Asthma Mother   . Alzheimer's disease Mother   . Kidney disease Father   . COPD Brother   . Alzheimer's disease Brother   . Arthritis Brother     Social History   Social History  . Marital Status: Married    Spouse Name: N/A  . Number of Children: 2  . Years of Education: N/A   Occupational History  . grading work    Social History Main Topics  . Smoking status: Former Smoker -- 50 years    Types: Cigarettes  . Smokeless tobacco: Never Used  . Alcohol Use: No  . Drug Use: No  . Sexual Activity: No   Other Topics Concern  . Not on file   Social History Narrative   Wife is health care POA   DNR order done 06/18/14   No prolonged ventilation and no tube feeds  'Review of Systems No sig joint pain No skin problems No heartburn or swallowing problems    Objective:   Physical Exam  Constitutional: He appears well-developed and well-nourished. No distress.  Neck: Normal range of motion. Neck supple. No thyromegaly present.  Cardiovascular: Normal rate, regular rhythm and normal heart sounds.  Exam reveals no gallop.   No murmur heard. Pulmonary/Chest: Effort normal and breath sounds normal. No respiratory distress. He has no wheezes. He has no rales.  Abdominal: Soft. There is no tenderness.  Musculoskeletal: He exhibits no edema or tenderness.  Lymphadenopathy:    He has no cervical adenopathy.  Skin: No rash noted.  Psychiatric:  Loquacious and pleasant          Assessment & Plan:

## 2015-07-15 NOTE — Assessment & Plan Note (Signed)
Mild issues now Will continue the meds

## 2015-07-15 NOTE — Assessment & Plan Note (Signed)
Controlled with tylenol only

## 2015-07-15 NOTE — Assessment & Plan Note (Signed)
Requiring more care and increased incontinence Reportedly doesn't qualify for SNF though Wife will keep him here for a while on loans--but cannot sustain

## 2015-07-15 NOTE — Assessment & Plan Note (Signed)
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

## 2015-11-04 ENCOUNTER — Encounter: Payer: Self-pay | Admitting: Internal Medicine

## 2015-11-04 ENCOUNTER — Non-Acute Institutional Stay: Payer: Medicare Other | Admitting: Internal Medicine

## 2015-11-04 VITALS — BP 112/62 | HR 62 | Temp 98.2°F | Resp 16 | Wt 205.0 lb

## 2015-11-04 DIAGNOSIS — G309 Alzheimer's disease, unspecified: Secondary | ICD-10-CM

## 2015-11-04 DIAGNOSIS — R197 Diarrhea, unspecified: Secondary | ICD-10-CM

## 2015-11-04 DIAGNOSIS — F22 Delusional disorders: Secondary | ICD-10-CM | POA: Diagnosis not present

## 2015-11-04 DIAGNOSIS — F0280 Dementia in other diseases classified elsewhere without behavioral disturbance: Secondary | ICD-10-CM

## 2015-11-04 DIAGNOSIS — I10 Essential (primary) hypertension: Secondary | ICD-10-CM

## 2015-11-04 DIAGNOSIS — F39 Unspecified mood [affective] disorder: Secondary | ICD-10-CM | POA: Diagnosis not present

## 2015-11-04 MED ORDER — CITALOPRAM HYDROBROMIDE 10 MG PO TABS: 10.0000 mg | ORAL_TABLET | ORAL | Status: DC

## 2015-11-04 NOTE — Assessment & Plan Note (Signed)
Constipation at times Will stop the regular imodium--just have prn

## 2015-11-04 NOTE — Assessment & Plan Note (Signed)
Persistent delusional system but no agitation Some hallucinations but doesn't distress him No Rx for this

## 2015-11-04 NOTE — Assessment & Plan Note (Signed)
Moderate but still doing okay in AL setting

## 2015-11-04 NOTE — Progress Notes (Signed)
Subjective:    Patient ID: [name] [name], male    DOB: [birth year], 82 y.o.   MRN: [id number]  HPI  Visit for review of dementia and other medical conditions Reviewed status with [facility] coordinator Wife is here  Has been stable Walks around himself Needs help with bathroom--but will initiate still at times  Mood is good Still believes he is still working in [employer] people ---like in his room (mom and dad still alive, etc) Not really agitated Not having depression either Discussed weaning his med  No heartburn No swallowing problems  Still private paying Ongoing legal issues because of property they have  Current Outpatient Prescriptions on File Prior to Visit  Medication Sig Dispense Refill  . cholestyramine light (PREVALITE) 4 G packet Take 4 g by mouth 2 (two) times daily.    . citalopram (CELEXA) 10 MG tablet Take 1 tablet (10 mg total) by mouth daily. 30 tablet 11  . guaifenesin (ROBITUSSIN) 100 MG/5ML syrup Take 200 mg by mouth every 6 (six) hours as needed for cough.    . loperamide (IMODIUM) 2 MG capsule Take 2 mg by mouth daily. Repeat ~noon if still having loose stools    . MAPAP ARTHRITIS PAIN 650 MG CR tablet TAKE ONE TABLET BY MOUTH 3 TIMES A DAY *DO NOT CRUSH* 90 tablet 11  . vitamin B-12 (CYANOCOBALAMIN) 1000 MCG tablet Take 1,000 mcg by mouth daily.     No current facility-administered medications on file prior to visit.    Allergies  Allergen Reactions  . Codeine Other (See Comments)    Other Reaction: Not Assessed    Past Medical History  Diagnosis Date  . GERD (gastroesophageal reflux disease)   . Hypertension   . Anxiety   . Alzheimer disease   . DIVERTICULOSIS, COLON, HX OF 09/19/2006  . ARTHRITIS 09/19/2006  . VENOUS INSUFFICIENCY 09/19/2006  . HYPERTENSION 09/19/2006  . ALZHEIMERS DISEASE 09/25/2008  . B12 DEFICIENCY 11/23/2008    Past Surgical History  Procedure Laterality Date  . Appendectomy  1980  . Eye surgery   2005    laser eye  . Back surgery  1982, 1992  . Shoulder surgery  2004    right  . Cataract extraction, bilateral  2004    Family History  Problem Relation Age of Onset  . Asthma Mother   . Alzheimer's disease Mother   . Kidney disease Father   . COPD Brother   . Alzheimer's disease Brother   . Arthritis Brother     Social History   Social History  . Marital Status: Married    Spouse Name: N/A  . Number of Children: 2  . Years of Education: N/A   Occupational History  . grading work    Social History Main Topics  . Smoking status: Former Smoker -- 50 years    Types: Cigarettes  . Smokeless tobacco: Never Used  . Alcohol Use: No  . Drug Use: No  . Sexual Activity: No   Other Topics Concern  . Not on file   Social History Narrative   Wife is health care POA   DNR order done 06/18/14   No prolonged ventilation and no tube feeds   Review of Systems Constipated last week--better now Discussed decreasing meds Gets rash in groin with sweating--wife using baby powder which is helping some Appetite still very good Weight stable or slightly up Sleeping okay Voids okay    Objective:   Physical Exam  Constitutional:  He appears well-developed. No distress.  Neck: Normal range of motion. Neck supple. No thyromegaly present.  Cardiovascular: Normal rate, regular rhythm and normal heart sounds.  Exam reveals no gallop.   No murmur heard. Pulmonary/Chest: Effort normal and breath sounds normal. No respiratory distress. He has no wheezes. He has no rales.  Abdominal: Soft. There is no tenderness.  Musculoskeletal: He exhibits no edema.  Lymphadenopathy:    He has no cervical adenopathy.  Neurological:  Still walks okay No focal weakness  Skin: No rash noted.  Psychiatric:  Less talkative--somewhat somenolent          Assessment & Plan:

## 2015-11-04 NOTE — Assessment & Plan Note (Signed)
Seems better Will try weaning citalopram to every other day

## 2015-11-04 NOTE — Assessment & Plan Note (Signed)
BP Readings from Last 3 Encounters:  11/04/15 112/62  07/15/15 140/78  03/04/15 115/70   Has been fine without Rx

## 2015-12-10 ENCOUNTER — Telehealth: Payer: Self-pay

## 2015-12-10 NOTE — Telephone Encounter (Signed)
Mrs Harlon FlorWhitaker said pt is being discharged from nursing home now and nursing home is giving her pts meds that will last at least thru the weekend. pts daughter will fax a current list of pts med and what pharmacy meds to go to on 12/13/15. [facility] C[facility].

## 2015-12-13 ENCOUNTER — Telehealth: Payer: Self-pay

## 2015-12-13 MED ORDER — LOPERAMIDE HCL 2 MG PO CAPS: 2.0000 mg | ORAL_CAPSULE | Freq: Every day | ORAL | Status: DC | PRN

## 2015-12-13 MED ORDER — ACETAMINOPHEN ER 650 MG PO TBCR: EXTENDED_RELEASE_TABLET | ORAL | Status: AC

## 2015-12-13 MED ORDER — CITALOPRAM HYDROBROMIDE 10 MG PO TABS: 10.0000 mg | ORAL_TABLET | ORAL | Status: DC

## 2015-12-13 MED ORDER — VITAMIN B-12 1000 MCG PO TABS: 1000.0000 ug | ORAL_TABLET | Freq: Every day | ORAL | Status: AC

## 2015-12-13 NOTE — Telephone Encounter (Signed)
Okay to give 1 year for all at current doses. Have wife set up appt in the next few weeks here in office

## 2015-12-13 NOTE — Telephone Encounter (Signed)
[facility] but is now back at [facility]. He is needing refills of B-12 1000MG , loperamide 2mg , Tylenol Arthritis, and citalopram

## 2015-12-13 NOTE — Telephone Encounter (Signed)
Thank you. I will be looking for it.

## 2015-12-13 NOTE — Telephone Encounter (Signed)
Rxs sent electronically. Will call Wednesday to set up appt

## 2015-12-16 NOTE — Telephone Encounter (Signed)
Made appt 12-30-15

## 2015-12-29 ENCOUNTER — Telehealth: Payer: Self-pay | Admitting: Internal Medicine

## 2015-12-29 NOTE — Telephone Encounter (Signed)
Please let them know I just signed the attestation form

## 2015-12-29 NOTE — Telephone Encounter (Signed)
[name] [name] with [facility] called back. She needs [name] [name]'s demographics/face sheet and most recent office notes faxed over to [phone].

## 2015-12-29 NOTE — Telephone Encounter (Signed)
Amil AmenJulia w/[facility] would like to know if the PCP received a order for hospice services for the patient.

## 2015-12-30 ENCOUNTER — Ambulatory Visit (INDEPENDENT_AMBULATORY_CARE_PROVIDER_SITE_OTHER): Payer: Medicare Other | Admitting: Internal Medicine

## 2015-12-30 ENCOUNTER — Encounter: Payer: Self-pay | Admitting: Internal Medicine

## 2015-12-30 VITALS — BP 116/80 | HR 63 | Temp 97.5°F | Wt 196.0 lb

## 2015-12-30 DIAGNOSIS — Z7189 Other specified counseling: Secondary | ICD-10-CM

## 2015-12-30 DIAGNOSIS — F39 Unspecified mood [affective] disorder: Secondary | ICD-10-CM

## 2015-12-30 DIAGNOSIS — E441 Mild protein-calorie malnutrition: Secondary | ICD-10-CM

## 2015-12-30 DIAGNOSIS — F0280 Dementia in other diseases classified elsewhere without behavioral disturbance: Secondary | ICD-10-CM

## 2015-12-30 DIAGNOSIS — G309 Alzheimer's disease, unspecified: Secondary | ICD-10-CM

## 2015-12-30 DIAGNOSIS — F22 Delusional disorders: Secondary | ICD-10-CM

## 2015-12-30 NOTE — Telephone Encounter (Signed)
Faxed 7/20

## 2015-12-30 NOTE — Assessment & Plan Note (Signed)
Somewhat worse with change of living situation Will go back to daily on the citalopram

## 2015-12-30 NOTE — Assessment & Plan Note (Signed)
Ongoing but no severe agitation Will hold off on antipsychotic

## 2015-12-30 NOTE — Progress Notes (Signed)
Subjective:    Patient ID: [name] [name], male    DOB: [birth year], 82 y.o.   MRN: [id number]  HPI Here for follow up of severe dementia and other medical conditions Had to leave memory care unit at [facility] due to finances  Wife took him home the last day of June DIL called hospice for referral---hard to tell (he has lost 10# in last 2 months) Has AM aide 2 hours every morning (wife can't get him out of bed otherwise) Will get ornery then [facility] not stable. Needs support Incontinent--but wife has success with toileting program every 2 hours Sleeps okay at night Will get mild agitation in evening--just keeps talking No violent behavior Wife uses rail on bed to try to keep him from falling Has the rolling walker--but can't seem to figure out how to use  Same delusions Still working at his grading business, etc Talks to people all the time--asks for "momma" a lot  Also searches for his deceased brother  Some cough Wife gives the cough syrup No SOB  Mood is stable Can be agitated and anxious --especially in AM Variable mood Psychomotor agitation--will walk and talk incessantly at times  Wife does have alarms on doors--just in case Probably could not go very far due to limtations  Current Outpatient Prescriptions on File Prior to Visit  Medication Sig Dispense Refill  . acetaminophen (MAPAP ARTHRITIS PAIN) 650 MG CR tablet TAKE ONE TABLET BY MOUTH 3 TIMES A DAY *DO NOT CRUSH* 90 tablet 11  . citalopram (CELEXA) 10 MG tablet Take 1 tablet (10 mg total) by mouth every other day. 15 tablet 11  . guaifenesin (ROBITUSSIN) 100 MG/5ML syrup Take 200 mg by mouth every 6 (six) hours as needed for cough.    . vitamin B-12 (CYANOCOBALAMIN) 1000 MCG tablet Take 1 tablet (1,000 mcg total) by mouth daily. 30 tablet 11  . loperamide (IMODIUM) 2 MG capsule Take 1 capsule (2 mg total) by mouth daily as needed. (Patient not taking: Reported on 12/30/2015) 30 capsule 11   No current  facility-administered medications on file prior to visit.    Allergies  Allergen Reactions  . Codeine Other (See Comments)    Other Reaction: Not Assessed    Past Medical History  Diagnosis Date  . GERD (gastroesophageal reflux disease)   . Hypertension   . Anxiety   . Alzheimer disease   . DIVERTICULOSIS, COLON, HX OF 09/19/2006  . ARTHRITIS 09/19/2006  . VENOUS INSUFFICIENCY 09/19/2006  . HYPERTENSION 09/19/2006  . ALZHEIMERS DISEASE 09/25/2008  . B12 DEFICIENCY 11/23/2008    Past Surgical History  Procedure Laterality Date  . Appendectomy  1980  . Eye surgery  2005    laser eye  . Back surgery  1982, 1992  . Shoulder surgery  2004    right  . Cataract extraction, bilateral  2004    Family History  Problem Relation Age of Onset  . Asthma Mother   . Alzheimer's disease Mother   . Kidney disease Father   . COPD Brother   . Alzheimer's disease Brother   . Arthritis Brother     Social History   Social History  . Marital Status: Married    Spouse Name: N/A  . Number of Children: 2  . Years of Education: N/A   Occupational History  . grading work    Social History Main Topics  . Smoking status: Former Smoker -- 50 years    Types: Cigarettes  . Smokeless  tobacco: Never Used  . Alcohol Use: No  . Drug Use: No  . Sexual Activity: No   Other Topics Concern  . Not on file   Social History Narrative   Wife is health care POA   DNR order done 06/18/14   No prolonged ventilation and no tube feeds   tReview of Systems Diarrhea is better Does eat fairly well    Objective:   Physical Exam  Constitutional:  Clearly has lost some weight--some face and upper body muscle loss  Neck: Normal range of motion. Neck supple. No thyromegaly present.  Cardiovascular: Normal rate, regular rhythm and normal heart sounds.  Exam reveals no gallop.   No murmur heard. Pulmonary/Chest: Effort normal and breath sounds normal. No respiratory distress. He has no wheezes. He has no  rales.  Abdominal: Soft. There is tenderness.  Musculoskeletal: He exhibits no edema.  Lymphadenopathy:    He has no cervical adenopathy.  Neurological:  Rambles talking but nothing that makes sense  Psychiatric:  Keeps talking--though not really psychomotor agitation          Assessment & Plan:

## 2015-12-30 NOTE — Progress Notes (Signed)
Pre visit review using our clinic review tool, if applicable. No additional management support is needed unless otherwise documented below in the visit note. 

## 2015-12-30 NOTE — Assessment & Plan Note (Signed)
Now approaching severe May be within 6 months with the recent weight loss--- will wait for hospice evaluation No Rx

## 2015-12-30 NOTE — Assessment & Plan Note (Signed)
Discussed food and supplements with wife

## 2015-12-30 NOTE — Assessment & Plan Note (Signed)
Has DNR 

## 2016-01-03 ENCOUNTER — Telehealth: Payer: Self-pay | Admitting: Internal Medicine

## 2016-01-03 NOTE — Telephone Encounter (Signed)
Pt's spouse l/m on triage phone requesting change of medication citalopram (CELEXA) 10 MG tablet  From every other day to once a day.  Please advise.  [facility].  Best number to call EC is [phone]

## 2016-01-04 MED ORDER — CITALOPRAM HYDROBROMIDE 10 MG PO TABS: 10.0000 mg | ORAL_TABLET | Freq: Every day | ORAL | 11 refills | Status: AC

## 2016-01-04 NOTE — Telephone Encounter (Signed)
This was changed to daily Please confirm with pharmacy and increase to #30 per month

## 2016-01-04 NOTE — Telephone Encounter (Signed)
I have sent in a new rx for daily #30. Spoke to wife

## 2016-01-19 ENCOUNTER — Telehealth: Payer: Self-pay

## 2016-01-19 NOTE — Telephone Encounter (Signed)
Pt's wife called stating that it is very hard for pt to get in and out of bed she would like an order for a hospital bed. Please advise.

## 2016-01-19 NOTE — Telephone Encounter (Signed)
Spoke to [employer] Morgan Energy[employer]eggy. She said the issue is he is hard to get out of bed. May just wait until his next OV to discuss it.

## 2016-01-19 NOTE — Telephone Encounter (Signed)
I cannot order a hospital bed without a face to face visit to discuss and document the reasons for it. In general, insurance will not cover it unless he has chronic aspiration or respiratory disease that requires the head of bed elevated ---or pain, etc that requires frequent changes in positioning

## 2016-04-01 ENCOUNTER — Emergency Department: Payer: Medicare Other

## 2016-04-01 ENCOUNTER — Encounter: Payer: Self-pay | Admitting: Emergency Medicine

## 2016-04-01 ENCOUNTER — Observation Stay
Admission: EM | Admit: 2016-04-01 | Discharge: 2016-04-04 | Disposition: A | Discharge status: Home / Self Care | Payer: Medicare Other | Attending: Internal Medicine | Admitting: Internal Medicine

## 2016-04-01 DIAGNOSIS — F0280 Dementia in other diseases classified elsewhere without behavioral disturbance: Secondary | ICD-10-CM | POA: Diagnosis present

## 2016-04-01 DIAGNOSIS — G309 Alzheimer's disease, unspecified: Secondary | ICD-10-CM | POA: Insufficient documentation

## 2016-04-01 DIAGNOSIS — Z79899 Other long term (current) drug therapy: Secondary | ICD-10-CM | POA: Diagnosis not present

## 2016-04-01 DIAGNOSIS — Z825 Family history of asthma and other chronic lower respiratory diseases: Secondary | ICD-10-CM | POA: Diagnosis not present

## 2016-04-01 DIAGNOSIS — F419 Anxiety disorder, unspecified: Secondary | ICD-10-CM | POA: Insufficient documentation

## 2016-04-01 DIAGNOSIS — K219 Gastro-esophageal reflux disease without esophagitis: Secondary | ICD-10-CM | POA: Diagnosis present

## 2016-04-01 DIAGNOSIS — Z841 Family history of disorders of kidney and ureter: Secondary | ICD-10-CM | POA: Diagnosis not present

## 2016-04-01 DIAGNOSIS — I872 Venous insufficiency (chronic) (peripheral): Secondary | ICD-10-CM | POA: Insufficient documentation

## 2016-04-01 DIAGNOSIS — M1990 Unspecified osteoarthritis, unspecified site: Secondary | ICD-10-CM | POA: Insufficient documentation

## 2016-04-01 DIAGNOSIS — I6782 Cerebral ischemia: Secondary | ICD-10-CM | POA: Diagnosis not present

## 2016-04-01 DIAGNOSIS — N390 Urinary tract infection, site not specified: Secondary | ICD-10-CM | POA: Diagnosis not present

## 2016-04-01 DIAGNOSIS — G9340 Encephalopathy, unspecified: Principal | ICD-10-CM | POA: Diagnosis present

## 2016-04-01 DIAGNOSIS — I10 Essential (primary) hypertension: Secondary | ICD-10-CM | POA: Diagnosis not present

## 2016-04-01 DIAGNOSIS — Z87440 Personal history of urinary (tract) infections: Secondary | ICD-10-CM | POA: Insufficient documentation

## 2016-04-01 DIAGNOSIS — R262 Difficulty in walking, not elsewhere classified: Secondary | ICD-10-CM

## 2016-04-01 DIAGNOSIS — R531 Weakness: Secondary | ICD-10-CM | POA: Diagnosis not present

## 2016-04-01 DIAGNOSIS — Z820 Family history of epilepsy and other diseases of the nervous system: Secondary | ICD-10-CM | POA: Diagnosis not present

## 2016-04-01 DIAGNOSIS — Z87891 Personal history of nicotine dependence: Secondary | ICD-10-CM | POA: Insufficient documentation

## 2016-04-01 DIAGNOSIS — J449 Chronic obstructive pulmonary disease, unspecified: Secondary | ICD-10-CM | POA: Insufficient documentation

## 2016-04-01 DIAGNOSIS — I700 Atherosclerosis of aorta: Secondary | ICD-10-CM | POA: Diagnosis not present

## 2016-04-01 DIAGNOSIS — Z885 Allergy status to narcotic agent status: Secondary | ICD-10-CM | POA: Diagnosis not present

## 2016-04-01 DIAGNOSIS — Z8261 Family history of arthritis: Secondary | ICD-10-CM | POA: Insufficient documentation

## 2016-04-01 DIAGNOSIS — E538 Deficiency of other specified B group vitamins: Secondary | ICD-10-CM | POA: Insufficient documentation

## 2016-04-01 DIAGNOSIS — R410 Disorientation, unspecified: Secondary | ICD-10-CM | POA: Diagnosis not present

## 2016-04-01 LAB — COMPREHENSIVE METABOLIC PANEL
ALT: 12 U/L — ABNORMAL LOW (ref 17–63)
AST: 17 U/L (ref 15–41)
Albumin: 3.8 g/dL (ref 3.5–5.0)
Alkaline Phosphatase: 69 U/L (ref 38–126)
Anion gap: 6 (ref 5–15)
BILIRUBIN TOTAL: 1.5 mg/dL — AB (ref 0.3–1.2)
BUN: 21 mg/dL — ABNORMAL HIGH (ref 6–20)
CO2: 24 mmol/L (ref 22–32)
Calcium: 9.1 mg/dL (ref 8.9–10.3)
Chloride: 115 mmol/L — ABNORMAL HIGH (ref 101–111)
Creatinine, Ser: 1.07 mg/dL (ref 0.61–1.24)
GFR calc non Af Amer: >60 mL/min (ref >60)
Glucose, Bld: 94 mg/dL (ref 65–99)
Potassium: 3.7 mmol/L (ref 3.5–5.1)
Sodium: 145 mmol/L (ref 135–145)
TOTAL PROTEIN: 6.7 g/dL (ref 6.5–8.1)

## 2016-04-01 LAB — URINALYSIS COMPLETE WITH MICROSCOPIC (ARMC ONLY)
BILIRUBIN URINE: NEGATIVE
Glucose, UA: NEGATIVE mg/dL
HGB URINE DIPSTICK: NEGATIVE
Ketones, ur: NEGATIVE mg/dL
Nitrite: NEGATIVE
Protein, ur: NEGATIVE mg/dL
SPECIFIC GRAVITY, URINE: 1.021 (ref 1.005–1.030)
SQUAMOUS EPITHELIAL / LPF: NONE SEEN
pH: 5 (ref 5.0–8.0)

## 2016-04-01 LAB — CBC WITH DIFFERENTIAL/PLATELET
Basophils Absolute: 0 10*3/uL (ref 0–0.1)
Basophils Relative: 1 %
EOS PCT: 4 %
Eosinophils Absolute: 0.2 10*3/uL (ref 0–0.7)
HCT: 42.7 % (ref 40.0–52.0)
HEMOGLOBIN: 14.6 g/dL (ref 13.0–18.0)
LYMPHS PCT: 19 %
Lymphs Abs: 1.2 10*3/uL (ref 1.0–3.6)
MCH: 34.1 pg — ABNORMAL HIGH (ref 26.0–34.0)
MCHC: 34.2 g/dL (ref 32.0–36.0)
MCV: 99.6 fL (ref 80.0–100.0)
Monocytes Absolute: 0.5 10*3/uL (ref 0.2–1.0)
Monocytes Relative: 7 %
NEUTROS PCT: 69 %
Neutro Abs: 4.4 10*3/uL (ref 1.4–6.5)
Platelets: 241 10*3/uL (ref 150–440)
RBC: 4.28 MIL/uL — AB (ref 4.40–5.90)
RDW: 14.2 % (ref 11.5–14.5)
WBC: 6.3 10*3/uL (ref 3.8–10.6)

## 2016-04-01 LAB — TROPONIN I

## 2016-04-01 MED ORDER — ACETAMINOPHEN 650 MG RE SUPP: 650.0000 mg | Freq: Four times a day (QID) | RECTAL | Status: DC | PRN

## 2016-04-01 MED ORDER — ENOXAPARIN SODIUM 40 MG/0.4ML ~~LOC~~ SOLN
40.0000 mg | SUBCUTANEOUS | Status: DC
  Administered 2016-04-02 – 2016-04-03 (×2): 40 mg via SUBCUTANEOUS
  Filled 2016-04-01 (×2): qty 0.4

## 2016-04-01 MED ORDER — ONDANSETRON HCL 4 MG/2ML IJ SOLN: 4.0000 mg | Freq: Four times a day (QID) | INTRAMUSCULAR | Status: DC | PRN

## 2016-04-01 MED ORDER — SODIUM CHLORIDE 0.9% FLUSH
3.0000 mL | Freq: Two times a day (BID) | INTRAVENOUS | Status: DC
  Administered 2016-04-02 – 2016-04-04 (×4): 3 mL via INTRAVENOUS

## 2016-04-01 MED ORDER — CEFTRIAXONE SODIUM-DEXTROSE 1-3.74 GM-% IV SOLR
1.0000 g | Freq: Once | INTRAVENOUS | Status: AC
  Administered 2016-04-01: 1 g via INTRAVENOUS
  Filled 2016-04-01: qty 50

## 2016-04-01 MED ORDER — CEFTRIAXONE SODIUM-DEXTROSE 2-2.22 GM-% IV SOLR
2.0000 g | INTRAVENOUS | Status: DC
  Administered 2016-04-02: 2 g via INTRAVENOUS
  Filled 2016-04-01 (×2): qty 50

## 2016-04-01 MED ORDER — SODIUM CHLORIDE 0.9 % IV BOLUS (SEPSIS)
1000.0000 mL | Freq: Once | INTRAVENOUS | Status: AC
  Administered 2016-04-01: 1000 mL via INTRAVENOUS

## 2016-04-01 MED ORDER — ACETAMINOPHEN 325 MG PO TABS: 650.0000 mg | ORAL_TABLET | Freq: Four times a day (QID) | ORAL | Status: DC | PRN

## 2016-04-01 MED ORDER — DEXTROSE 5 % IV SOLN: 1.0000 g | Freq: Once | INTRAVENOUS | Status: DC

## 2016-04-01 MED ORDER — SODIUM CHLORIDE 0.9 % IV SOLN
INTRAVENOUS | Status: AC
  Administered 2016-04-01: 23:00:00 via INTRAVENOUS

## 2016-04-01 MED ORDER — CITALOPRAM HYDROBROMIDE 20 MG PO TABS
10.0000 mg | ORAL_TABLET | Freq: Every day | ORAL | Status: DC
  Administered 2016-04-02 – 2016-04-04 (×3): 10 mg via ORAL
  Filled 2016-04-01 (×3): qty 1

## 2016-04-01 MED ORDER — ONDANSETRON HCL 4 MG PO TABS: 4.0000 mg | ORAL_TABLET | Freq: Four times a day (QID) | ORAL | Status: DC | PRN

## 2016-04-01 NOTE — Progress Notes (Signed)
Pharmacy Antibiotic Note  [name] [name] is a 82 y.o. male admitted on 04/01/2016 with UTI.  Pharmacy has been consulted for ceftriaxone dosing.  Plan: Ceftriaxone 2 grams q 24 hours ordered.  Height: 5\' 9"  (175.3 cm) Weight: 194 lb (88 kg) IBW/kg (Calculated) : 70.7  Temp (24hrs), Avg:97.7 F (36.5 C), Min:97.7 F (36.5 C), Max:97.7 F (36.5 C)   Recent Labs Lab 04/01/16 1830  WBC 6.3  CREATININE 1.07    Estimated Creatinine Clearance: 53.4 mL/min (by C-G formula based on SCr of 1.07 mg/dL).    Allergies  Allergen Reactions  . Codeine Other (See Comments)    Other Reaction: Not Assessed    Antimicrobials this admission:   >>  ceftriaxone  >>   Dose adjustments this admission:   Microbiology results: 10/21 BCx: pending 10/21 UCx: pending     10/21 UA: LE(+) NO2(-) WBC 6-30  Thank you for allowing pharmacy to be a part of this patient's care.  Conchita Truxillo S 04/01/2016 11:27 PM

## 2016-04-01 NOTE — H&P (Addendum)
[facility]   PATIENT NAME: [name] [name]    MR#:  [id number]  DATE OF BIRTH:  03/10/1927  DATE OF ADMISSION:  04/01/2016  PRIMARY CARE PHYSICIAN: Tillman Abideichard Letvak, MD   REQUESTING/REFERRING PHYSICIAN: Alphonzo LemmingsMcShane, MD  CHIEF COMPLAINT:   Chief Complaint  Patient presents with  . Altered Mental Status    HISTORY OF PRESENT ILLNESS:  [name] [name]  is a 82 y.o. male who presents with Increased confusion. Patient has dementia at baseline and cannot contribute to his own history. Wife is present at bedside in the ED and provides history. She states that for the last couple days he has been getting progressively more confused. She states these symptoms are consistent with prior urinary tract infection the patient had. Here in the ED he was noted to have the same on UA today. Antibiotics were started in the ED and hospitalists were called for admission.  PAST MEDICAL HISTORY:   Past Medical History:  Diagnosis Date  . Alzheimer disease   . ALZHEIMERS DISEASE 09/25/2008  . Anxiety   . ARTHRITIS 09/19/2006  . B12 DEFICIENCY 11/23/2008  . DIVERTICULOSIS, COLON, HX OF 09/19/2006  . GERD (gastroesophageal reflux disease)   . Hypertension   . HYPERTENSION 09/19/2006  . VENOUS INSUFFICIENCY 09/19/2006    PAST SURGICAL HISTORY:   Past Surgical History:  Procedure Laterality Date  . APPENDECTOMY  1980  . BACK SURGERY  1982, 1992  . CATARACT EXTRACTION, BILATERAL  2004  . EYE SURGERY  2005   laser eye  . SHOULDER SURGERY  2004   right    SOCIAL HISTORY:   Social History  Substance Use Topics  . Smoking status: Former Smoker    Years: 50.00    Types: Cigarettes  . Smokeless tobacco: Never Used  . Alcohol use No    FAMILY HISTORY:   Family History  Problem Relation Age of Onset  . Asthma Mother   . Alzheimer's disease Mother   . Kidney disease Father   . COPD Brother   . Alzheimer's disease Brother   . Arthritis Brother      DRUG ALLERGIES:   Allergies  Allergen Reactions  . Codeine Other (See Comments)    Other Reaction: Not Assessed    MEDICATIONS AT HOME:   Prior to Admission medications   Medication Sig Start Date End Date Taking? Authorizing Provider  acetaminophen (MAPAP ARTHRITIS PAIN) 650 MG CR tablet TAKE ONE TABLET BY MOUTH 3 TIMES A DAY *DO NOT CRUSH* 12/13/15   Karie Schwalbeichard I Letvak, MD  citalopram (CELEXA) 10 MG tablet Take 1 tablet (10 mg total) by mouth daily. 01/04/16   Karie Schwalbeichard I Letvak, MD  guaifenesin (ROBITUSSIN) 100 MG/5ML syrup Take 200 mg by mouth every 6 (six) hours as needed for cough.    Historical Provider, MD  loperamide (IMODIUM) 2 MG capsule Take 1 capsule (2 mg total) by mouth daily as needed. Patient not taking: Reported on 12/30/2015 12/13/15   Karie Schwalbeichard I Letvak, MD  vitamin B-12 (CYANOCOBALAMIN) 1000 MCG tablet Take 1 tablet (1,000 mcg total) by mouth daily. 12/13/15   Karie Schwalbeichard I Letvak, MD    REVIEW OF SYSTEMS:  Review of Systems  Unable to perform ROS: Dementia     VITAL SIGNS:   Vitals:   04/01/16 1757 04/01/16 1759 04/01/16 1805 04/01/16 1823  BP: 121/70   133/80  Pulse: 69   (!) 59  Resp: 18   20  Temp: 97.7 F (36.5 C)  TempSrc: Axillary     SpO2: 100%   100%  Weight:  95.3 kg (210 lb) 95.3 kg (210 lb)   Height:  5\' 9"  (1.753 m) 5\' 9"  (1.753 m)    Wt Readings from Last 3 Encounters:  04/01/16 95.3 kg (210 lb)  12/30/15 88.9 kg (196 lb)  11/04/15 93 kg (205 lb)    PHYSICAL EXAMINATION:  Physical Exam  Vitals reviewed. Constitutional: He appears well-developed and well-nourished. No distress.  HENT:  Head: Normocephalic and atraumatic.  Mouth/Throat: Oropharynx is clear and moist.  Eyes: Conjunctivae and EOM are normal. Pupils are equal, round, and reactive to light. No scleral icterus.  Neck: Normal range of motion. Neck supple. No JVD present. No thyromegaly present.  Cardiovascular: Normal rate, regular rhythm and intact distal pulses.  Exam reveals  no gallop and no friction rub.   No murmur heard. Respiratory: Effort normal and breath sounds normal. No respiratory distress. He has no wheezes. He has no rales.  GI: Soft. Bowel sounds are normal. He exhibits no distension. There is no tenderness.  Musculoskeletal: Normal range of motion. He exhibits no edema.  No arthritis, no gout  Lymphadenopathy:    He has no cervical adenopathy.  Neurological: He is alert. No cranial nerve deficit.  No dysarthria, no aphasia. Unable to fully assess due to patient's condition  Skin: Skin is warm and dry. No rash noted. No erythema.  Psychiatric:  Unable to assess due to patient's condition    LABORATORY PANEL:   CBC  Recent Labs Lab 04/01/16 1830  WBC 6.3  HGB 14.6  HCT 42.7  PLT 241   ------------------------------------------------------------------------------------------------------------------  Chemistries   Recent Labs Lab 04/01/16 1830  NA 145  K 3.7  CL 115*  CO2 24  GLUCOSE 94  BUN 21*  CREATININE 1.07  CALCIUM 9.1  AST 17  ALT 12*  ALKPHOS 69  BILITOT 1.5*   ------------------------------------------------------------------------------------------------------------------  Cardiac Enzymes  Recent Labs Lab 04/01/16 1830  TROPONINI <0.03   ------------------------------------------------------------------------------------------------------------------  RADIOLOGY:  Ct Head Wo Contrast  Result Date: 04/01/2016 CLINICAL DATA:  Weakness, less responsive EXAM: CT HEAD WITHOUT CONTRAST TECHNIQUE: Contiguous axial images were obtained from the base of the skull through the vertex without intravenous contrast. COMPARISON:  None. FINDINGS: Brain: No intracranial hemorrhage, mass effect or midline shift. Stable global atrophy. Stable extensive chronic white matter disease. Again noted cavum septum pellucidum. Stable midline tubular lipoma. No definite acute cortical infarction. Ventricular size is stable from prior  exam. Vascular: No hyperdense vessel or unexpected calcification. Skull: There are motion artifacts.  No skull fracture is noted. Sinuses/Orbits: Paranasal sinuses and mastoid air cells are unremarkable. Other: None IMPRESSION: No acute intracranial abnormality. Stable atrophy and extensive chronic white matter disease. No definite acute cortical infarction. Stable chronic findings as described above. Electronically Signed   By: Natasha Mead M.D.   On: 04/01/2016 20:06   Dg Chest Port 1 View  Result Date: 04/01/2016 CLINICAL DATA:  Confusion and difficulty ambulating.  Former smoker. EXAM: PORTABLE CHEST 1 VIEW COMPARISON:  07/15/2011 FINDINGS: Emphysematous hyperinflation of the lungs, upper lobe predominant with minimal crowding of lower lobe interstitial lung markings. There is atelectasis at the left lung base versus scarring. Eventration of the right hemidiaphragm is stable with colonic interposition over the liver shadow. The heart is normal in size. The aorta is slightly uncoiled without aneurysm. Atheromatous calcifications are seen of the aortic arch and descending aorta. No acute osseous abnormality. IMPRESSION: COPD without acute pulmonary  disease. Electronically Signed   By: Tollie Eth M.D.   On: 04/01/2016 19:47    EKG:   Orders placed or performed during the hospital encounter of 04/01/16  . EKG 12-Lead  . EKG 12-Lead  . ED EKG  . ED EKG    IMPRESSION AND PLAN:  Principal Problem:   Acute encephalopathy - due to the patient's underlying dementia in the setting of acute UTI, expect some improvement with treatment of underlying infection Active Problems:   UTI (urinary tract infection) - IV antibiotics, cultures sent   Alzheimer's dementia without behavioral disturbance - continue home meds   Essential hypertension, benign - continue home meds  All the records are reviewed and case discussed with ED provider. Management plans discussed with the patient and/or family.  DVT  PROPHYLAXIS: SubQ lovenox  GI PROPHYLAXIS: None  ADMISSION STATUS: Observation  CODE STATUS: Full Code Status History    Date Active Date Inactive Code Status Order ID Comments User Context   07/16/2011  2:02 AM 07/19/2011  5:29 PM Full Code [id number]  Jacolyn Reedy, LPN Inpatient    Advance Directive Documentation   Flowsheet Row Most Recent Value  Type of Advance Directive  Healthcare Power of Attorney  Pre-existing out of facility DNR order (yellow form or pink MOST form)  No data  "MOST" Form in Place?  No data      TOTAL TIME TAKING CARE OF THIS PATIENT: 45 minutes.    Keirah Konitzer FIELDING 04/01/2016, 8:56 PM  [facility]  Office  [phone]  CC: Primary care physician; Tillman Abide, MD

## 2016-04-01 NOTE — ED Notes (Signed)
Patient transported to CT 

## 2016-04-01 NOTE — ED Provider Notes (Signed)
[facility] Emergency Department Provider Note  ____________________________________________   I have reviewed the triage vital signs and the nursing notes.   HISTORY  Chief Complaint Altered Mental Status    HPI [name] [name] is a 82 y.o. male who is a history of significant dementia. All of of the history is per family. Patient has been weaker today and yesterday than he was in the past. He has not had any fever or vomiting or any other symptoms. He sometimes gets like this when he has a urinary tract infection. Normally, he gets up and walks on his own but he has needed assistance and not even walk much at all today. He has been less responsive. They feel that he likely has an invalid infection. There is been no fall or closed head injury.Level 5 chart caveat; no further history available due to patient status. His mental status, according to family, is approximately at baseline.    Past Medical History:  Diagnosis Date  . Alzheimer disease   . ALZHEIMERS DISEASE 09/25/2008  . Anxiety   . ARTHRITIS 09/19/2006  . B12 DEFICIENCY 11/23/2008  . DIVERTICULOSIS, COLON, HX OF 09/19/2006  . GERD (gastroesophageal reflux disease)   . Hypertension   . HYPERTENSION 09/19/2006  . VENOUS INSUFFICIENCY 09/19/2006    Patient Active Problem List   Diagnosis Date Noted  . Malnutrition of mild degree (HCC) 12/30/2015  . Counseling regarding advanced directives 01/15/2014  . Diarrhea 12/30/2013  . Delusions (HCC) 10/02/2013  . Episodic mood disorder (HCC) 12/09/2009  . B12 DEFICIENCY 11/23/2008  . Alzheimer's dementia without behavioral disturbance 09/25/2008  . Essential hypertension, benign 09/19/2006  . VENOUS INSUFFICIENCY 09/19/2006  . Osteoarthritis, multiple sites 09/19/2006  . DIVERTICULOSIS, COLON, HX OF 09/19/2006  . GERD 03/01/2005    Past Surgical History:  Procedure Laterality Date  . APPENDECTOMY  1980  . BACK SURGERY  1982, 1992  . CATARACT  EXTRACTION, BILATERAL  2004  . EYE SURGERY  2005   laser eye  . SHOULDER SURGERY  2004   right    Prior to Admission medications   Medication Sig Start Date End Date Taking? Authorizing Provider  acetaminophen (MAPAP ARTHRITIS PAIN) 650 MG CR tablet TAKE ONE TABLET BY MOUTH 3 TIMES A DAY *DO NOT CRUSH* 12/13/15   Karie Schwalbe, MD  citalopram (CELEXA) 10 MG tablet Take 1 tablet (10 mg total) by mouth daily. 01/04/16   Karie Schwalbe, MD  guaifenesin (ROBITUSSIN) 100 MG/5ML syrup Take 200 mg by mouth every 6 (six) hours as needed for cough.    Historical Provider, MD  loperamide (IMODIUM) 2 MG capsule Take 1 capsule (2 mg total) by mouth daily as needed. Patient not taking: Reported on 12/30/2015 12/13/15   Karie Schwalbe, MD  vitamin B-12 (CYANOCOBALAMIN) 1000 MCG tablet Take 1 tablet (1,000 mcg total) by mouth daily. 12/13/15   Karie Schwalbe, MD    Allergies Codeine  Family History  Problem Relation Age of Onset  . Asthma Mother   . Alzheimer's disease Mother   . Kidney disease Father   . COPD Brother   . Alzheimer's disease Brother   . Arthritis Brother     Social History Social History  Substance Use Topics  . Smoking status: Former Smoker    Years: 50.00    Types: Cigarettes  . Smokeless tobacco: Never Used  . Alcohol use No    Review of Systems Systems review is quite limited, this is only per  family. Patient himself cannot give a history given Alzheimer's Constitutional: No fever/chills Eyes: No visual changes. ENT: No sore throat. No stiff neck no neck pain Cardiovascular: Denies chest pain. Respiratory: Denies shortness of breath. Gastrointestinal:   no vomiting.  No diarrhea.  No constipation. Genitourinary: Negative for dysuria. Musculoskeletal: Negative lower extremity swelling Skin: Negative for rash. Neurological: Negative for severe headaches, focal weakness or numbness. 10-point ROS otherwise  negative.  ____________________________________________   PHYSICAL EXAM:  VITAL SIGNS: ED Triage Vitals  Enc Vitals Group     BP 04/01/16 1757 121/70     Pulse Rate 04/01/16 1757 69     Resp 04/01/16 1757 18     Temp 04/01/16 1757 97.7 F (36.5 C)     Temp Source 04/01/16 1757 Axillary     SpO2 04/01/16 1757 100 %     Weight 04/01/16 1759 210 lb (95.3 kg)     Height 04/01/16 1759 5\' 9"  (1.753 m)     Head Circumference --      Peak Flow --      Pain Score --      Pain Loc --      Pain Edu? --      Excl. in GC? --     Constitutional: Alert and orientedTo name only, confused at baseline per family. Well appearing and in no acute distress. Eyes: Conjunctivae are normal. PERRL. EOMI. Head: Atraumatic. Nose: No congestion/rhinnorhea. Mouth/Throat: Mucous membranes are moist.  Oropharynx non-erythematous. Neck: No stridor.   Nontender with no meningismus Cardiovascular: Normal rate, regular rhythm. Grossly normal heart sounds.  Good peripheral circulation. Respiratory: Normal respiratory effort.  No retractions. Lungs CTAB. Abdominal: Soft and nontender. No distention. No guarding no rebound Back:  There is no focal tenderness or step off.  there is no midline tenderness there are no lesions noted. there is no CVA tenderness Musculoskeletal: No lower extremity tenderness, no upper extremity tenderness. No joint effusions, no DVT signs strong distal pulses no edema Neurologic:  Normal speech and language. No gross focal neurologic deficits are appreciated.  Skin:  Skin is warm, dry and intact. No rash noted. Psychiatric: Mood and affect are normal. Speech and behavior are normal.  ____________________________________________   LABS (all labs ordered are listed, but only abnormal results are displayed)  Labs Reviewed  COMPREHENSIVE METABOLIC PANEL - Abnormal; Notable for the following:       Result Value   Chloride 115 (*)    BUN 21 (*)    ALT 12 (*)    Total Bilirubin 1.5  (*)    All other components within normal limits  CBC WITH DIFFERENTIAL/PLATELET - Abnormal; Notable for the following:    RBC 4.28 (*)    MCH 34.1 (*)    All other components within normal limits  URINALYSIS COMPLETEWITH MICROSCOPIC ([facility] ONLY) - Abnormal; Notable for the following:    Color, Urine YELLOW (*)    APPearance HAZY (*)    Leukocytes, UA 1+ (*)    Bacteria, UA RARE (*)    All other components within normal limits  URINE CULTURE  TROPONIN I   ____________________________________________  EKG  I personally interpreted any EKGs ordered by me or triage Normal sinus rhythm at 70 bpm, occasional early beat. No acute ST elevation or depression. ____________________________________________  RADIOLOGY  I reviewed any imaging ordered by me or triage that were performed during my shift and, if possible, patient and/or family made aware of any abnormal findings. ____________________________________________   PROCEDURES  Procedure(s) performed: None  Procedures  Critical Care performed: None  ____________________________________________   INITIAL IMPRESSION / ASSESSMENT AND PLAN / ED COURSE  Pertinent labs & imaging results that were available during my care of the patient were reviewed by me and considered in my medical decision making (see chart for details).  Patient with generalized weakness without any obvious focal findings. Family wheezes is likely secondary to urinary tract infection which she doesn't fact have. We will give him antibiotics. Will check cultures, patient will require admission given change in mental status and ability to get around.  Clinical Course   ____________________________________________   FINAL CLINICAL IMPRESSION(S) / ED DIAGNOSES  Final diagnoses:  Confusion      This chart was dictated using voice recognition software.  Despite best efforts to proofread,  errors can occur which can change meaning.      Jeanmarie Plant, MD 04/01/16 [phone]

## 2016-04-01 NOTE — ED Notes (Signed)
Per pt's family pt was weak and not talking today, along with increased confusion. Pt is able to state name but does not know wife's name which is new for him. Pt was able to assist with standing to get to bed. Urine collected from pt and pt was placed in a dry diaper. Pt is in NAD at this time.

## 2016-04-01 NOTE — ED Triage Notes (Signed)
Patient has history of Alzheimer's for 11 years.  Wife states that this week, patient has been more sleepy, some stuttering with talking, and generally lethargic.

## 2016-04-02 DIAGNOSIS — N390 Urinary tract infection, site not specified: Secondary | ICD-10-CM | POA: Diagnosis not present

## 2016-04-02 DIAGNOSIS — G309 Alzheimer's disease, unspecified: Secondary | ICD-10-CM | POA: Diagnosis not present

## 2016-04-02 DIAGNOSIS — G9340 Encephalopathy, unspecified: Secondary | ICD-10-CM | POA: Diagnosis not present

## 2016-04-02 DIAGNOSIS — I10 Essential (primary) hypertension: Secondary | ICD-10-CM | POA: Diagnosis not present

## 2016-04-02 LAB — BASIC METABOLIC PANEL
Anion gap: 4 — ABNORMAL LOW (ref 5–15)
BUN: 19 mg/dL (ref 6–20)
CHLORIDE: 118 mmol/L — AB (ref 101–111)
CO2: 23 mmol/L (ref 22–32)
CREATININE: 0.91 mg/dL (ref 0.61–1.24)
Calcium: 8.4 mg/dL — ABNORMAL LOW (ref 8.9–10.3)
Glucose, Bld: 94 mg/dL (ref 65–99)
Potassium: 3.7 mmol/L (ref 3.5–5.1)
SODIUM: 145 mmol/L (ref 135–145)

## 2016-04-02 LAB — CBC
HCT: 38 % — ABNORMAL LOW (ref 40.0–52.0)
Hemoglobin: 13.2 g/dL (ref 13.0–18.0)
MCH: 34.3 pg — ABNORMAL HIGH (ref 26.0–34.0)
MCHC: 34.7 g/dL (ref 32.0–36.0)
MCV: 98.9 fL (ref 80.0–100.0)
PLATELETS: 191 10*3/uL (ref 150–440)
RBC: 3.84 MIL/uL — AB (ref 4.40–5.90)
RDW: 13.9 % (ref 11.5–14.5)
WBC: 6.2 10*3/uL (ref 3.8–10.6)

## 2016-04-02 NOTE — Progress Notes (Signed)
[employer] [facility] [place] [facility]   PATIENT NAME: [name] [name]    MR#:  [id number]  DATE OF BIRTH:  04/25/1927  SUBJECTIVE:  CHIEF COMPLAINT:   Chief Complaint  Patient presents with  . Altered Mental Status     Brought for AMs. More alert today, have baseline dementia, but still not at baseline as per grand daughter Baird LyonsCasey on phone.  REVIEW OF SYSTEMS:   Review of Systems  Unable to perform ROS: Mental status change   also have baseline dementia.  DRUG ALLERGIES:   Allergies  Allergen Reactions  . Codeine Other (See Comments)    Other Reaction: Not Assessed    VITALS:  Blood pressure (!) 107/52, pulse (!) 56, temperature 97.6 F (36.4 C), temperature source Axillary, resp. rate 18, height 5\' 9"  (1.753 m), weight 88 kg (194 lb), SpO2 100 %.  PHYSICAL EXAMINATION:   Constitutional: He appears well-developed and well-nourished. No distress.  HENT:  Head: Normocephalic and atraumatic.  Mouth/Throat: Oropharynx is clear and moist.  Eyes: Conjunctivae and EOM are normal. Pupils are equal, round, and reactive to light. No scleral icterus.  Neck: Normal range of motion. Neck supple. No JVD present. No thyromegaly present.  Cardiovascular: Normal rate, regular rhythm and intact distal pulses.  Exam reveals no gallop and no friction rub.   No murmur heard. Respiratory: Effort normal and breath sounds normal. No respiratory distress. He has no wheezes. He has no rales.  GI: Soft. Bowel sounds are normal. He exhibits no distension. There is no tenderness.  Musculoskeletal: Normal range of motion. He exhibits no edema.  No arthritis, no gout  Lymphadenopathy:    He has no cervical adenopathy.  Neurological: He is alert. No cranial nerve deficit.  No dysarthria, no aphasia. Unable to fully assess due to patient's condition , have some baseline dementia. Skin: Skin is warm and dry. No rash noted. No erythema.  Psychiatric:  Unable to assess due to patient's  condition  Physical Exam LABORATORY PANEL:   CBC  Recent Labs Lab 04/02/16 0458  WBC 6.2  HGB 13.2  HCT 38.0*  PLT 191   ------------------------------------------------------------------------------------------------------------------  Chemistries   Recent Labs Lab 04/01/16 1830 04/02/16 0458  NA 145 145  K 3.7 3.7  CL 115* 118*  CO2 24 23  GLUCOSE 94 94  BUN 21* 19  CREATININE 1.07 0.91  CALCIUM 9.1 8.4*  AST 17  --   ALT 12*  --   ALKPHOS 69  --   BILITOT 1.5*  --    ------------------------------------------------------------------------------------------------------------------  Cardiac Enzymes  Recent Labs Lab 04/01/16 1830  TROPONINI <0.03   ------------------------------------------------------------------------------------------------------------------  RADIOLOGY:  Ct Head Wo Contrast  Result Date: 04/01/2016 CLINICAL DATA:  Weakness, less responsive EXAM: CT HEAD WITHOUT CONTRAST TECHNIQUE: Contiguous axial images were obtained from the base of the skull through the vertex without intravenous contrast. COMPARISON:  None. FINDINGS: Brain: No intracranial hemorrhage, mass effect or midline shift. Stable global atrophy. Stable extensive chronic white matter disease. Again noted cavum septum pellucidum. Stable midline tubular lipoma. No definite acute cortical infarction. Ventricular size is stable from prior exam. Vascular: No hyperdense vessel or unexpected calcification. Skull: There are motion artifacts.  No skull fracture is noted. Sinuses/Orbits: Paranasal sinuses and mastoid air cells are unremarkable. Other: None IMPRESSION: No acute intracranial abnormality. Stable atrophy and extensive chronic white matter disease. No definite acute cortical infarction. Stable chronic findings as described above. Electronically Signed   By: Lanette HampshireLiviu  Pop M.D.  On: 04/01/2016 20:06   Dg Chest Port 1 View  Result Date: 04/01/2016 CLINICAL DATA:  Confusion and  difficulty ambulating.  Former smoker. EXAM: PORTABLE CHEST 1 VIEW COMPARISON:  07/15/2011 FINDINGS: Emphysematous hyperinflation of the lungs, upper lobe predominant with minimal crowding of lower lobe interstitial lung markings. There is atelectasis at the left lung base versus scarring. Eventration of the right hemidiaphragm is stable with colonic interposition over the liver shadow. The heart is normal in size. The aorta is slightly uncoiled without aneurysm. Atheromatous calcifications are seen of the aortic arch and descending aorta. No acute osseous abnormality. IMPRESSION: COPD without acute pulmonary disease. Electronically Signed   By: Tollie Eth M.D.   On: 04/01/2016 19:47    ASSESSMENT AND PLAN:   Principal Problem:   Acute encephalopathy Active Problems:   Alzheimer's dementia without behavioral disturbance   Essential hypertension, benign   GERD   UTI (urinary tract infection)  *  Acute encephalopathy - due to the patient's underlying dementia in the setting of acute UTI, expect some improvement with treatment of underlying infection   cx awaited. Still not at baseline as per grand daughter.  *  UTI (urinary tract infection) - IV antibiotics, cultures sent *  Alzheimer's dementia without behavioral disturbance - continue home meds *  Essential hypertension, benign - continue home meds Will get PT for his weakness.  All the records are reviewed and case discussed with Care Management/Social Workerr. Management plans discussed with the patient, family and they are in agreement.  CODE STATUS: Full  TOTAL TIME TAKING CARE OF THIS PATIENT: 35 minutes.     POSSIBLE D/C IN 1-2 DAYS, DEPENDING ON CLINICAL CONDITION.   Altamese Dilling M.D on 04/02/2016   Between 7am to 6pm - Pager - [phone]  After 6pm go to [email or web address] - password [email or web address]  [facility] [place] [facility]  Office  [phone]  CC: Primary care physician; Tillman Abide, MD  Note: This  dictation was prepared with Dragon dictation along with smaller phrase technology. Any transcriptional errors that result from this process are unintentional.

## 2016-04-02 NOTE — Care Management Obs Status (Signed)
MEDICARE OBSERVATION STATUS NOTIFICATION   Patient Details  Name: [name] [name] MRN: [id number] Date of Birth: 03/09/1927   Medicare Observation Status Notification Given:  Yes    Lawerance Sabalebbie Valeen Borys, RN 04/02/2016, 11:12 AM

## 2016-04-03 DIAGNOSIS — N390 Urinary tract infection, site not specified: Secondary | ICD-10-CM | POA: Diagnosis not present

## 2016-04-03 DIAGNOSIS — G309 Alzheimer's disease, unspecified: Secondary | ICD-10-CM | POA: Diagnosis not present

## 2016-04-03 DIAGNOSIS — G9340 Encephalopathy, unspecified: Secondary | ICD-10-CM | POA: Diagnosis not present

## 2016-04-03 DIAGNOSIS — I10 Essential (primary) hypertension: Secondary | ICD-10-CM | POA: Diagnosis not present

## 2016-04-03 MED ORDER — SODIUM CHLORIDE 0.9 % IV SOLN
2.0000 g | Freq: Four times a day (QID) | INTRAVENOUS | Status: DC
  Administered 2016-04-03 – 2016-04-04 (×5): 2 g via INTRAVENOUS
  Filled 2016-04-03 (×8): qty 2000

## 2016-04-03 NOTE — Progress Notes (Signed)
[employer] [facility] - [place] at [facility]   PATIENT NAME: [name] [name]    MR#:  [id number]  DATE OF BIRTH:  September 25, 1926  SUBJECTIVE:  CHIEF COMPLAINT:   Chief Complaint  Patient presents with  . Altered Mental Status     Brought for AMs. More alert today, have baseline dementia.   Wife was in room today, as per her mentally at baseline, but appears very weak, and did not stand up with PT today.  REVIEW OF SYSTEMS:   Review of Systems  Unable to perform ROS: Mental status change   also have baseline dementia.  DRUG ALLERGIES:   Allergies  Allergen Reactions  . Codeine Other (See Comments)    Other Reaction: Not Assessed    VITALS:  Blood pressure 116/66, pulse (!) 45, temperature 97.8 F (36.6 C), temperature source Oral, resp. rate 18, height 5\' 9"  (1.753 m), weight 88 kg (194 lb), SpO2 99 %.  PHYSICAL EXAMINATION:   Constitutional: He appears well-developed and well-nourished. No distress.  HENT:  Head: Normocephalic and atraumatic.  Mouth/Throat: Oropharynx is clear and moist.  Eyes: Conjunctivae and EOM are normal. Pupils are equal, round, and reactive to light. No scleral icterus.  Neck: Normal range of motion. Neck supple. No JVD present. No thyromegaly present.  Cardiovascular: Normal rate, regular rhythm and intact distal pulses.  Exam reveals no gallop and no friction rub.   No murmur heard. Respiratory: Effort normal and breath sounds normal. No respiratory distress. He has no wheezes. He has no rales.  GI: Soft. Bowel sounds are normal. He exhibits no distension. There is no tenderness.  Musculoskeletal: Normal range of motion. He exhibits no edema.  No arthritis, no gout  Lymphadenopathy:    He has no cervical adenopathy.  Neurological: He is alert. No cranial nerve deficit.  No dysarthria, no aphasia. Unable to fully assess due to patient's condition , have some baseline dementia. He moves limbs spontaneously. Skin: Skin is warm and dry. No  rash noted. No erythema.  Psychiatric:  Unable to assess due to patient's condition  Physical Exam LABORATORY PANEL:   CBC  Recent Labs Lab 04/02/16 0458  WBC 6.2  HGB 13.2  HCT 38.0*  PLT 191   ------------------------------------------------------------------------------------------------------------------  Chemistries   Recent Labs Lab 04/01/16 1830 04/02/16 0458  NA 145 145  K 3.7 3.7  CL 115* 118*  CO2 24 23  GLUCOSE 94 94  BUN 21* 19  CREATININE 1.07 0.91  CALCIUM 9.1 8.4*  AST 17  --   ALT 12*  --   ALKPHOS 69  --   BILITOT 1.5*  --    ------------------------------------------------------------------------------------------------------------------  Cardiac Enzymes  Recent Labs Lab 04/01/16 1830  TROPONINI <0.03   ------------------------------------------------------------------------------------------------------------------  RADIOLOGY:  Ct Head Wo Contrast  Result Date: 04/01/2016 CLINICAL DATA:  Weakness, less responsive EXAM: CT HEAD WITHOUT CONTRAST TECHNIQUE: Contiguous axial images were obtained from the base of the skull through the vertex without intravenous contrast. COMPARISON:  None. FINDINGS: Brain: No intracranial hemorrhage, mass effect or midline shift. Stable global atrophy. Stable extensive chronic white matter disease. Again noted cavum septum pellucidum. Stable midline tubular lipoma. No definite acute cortical infarction. Ventricular size is stable from prior exam. Vascular: No hyperdense vessel or unexpected calcification. Skull: There are motion artifacts.  No skull fracture is noted. Sinuses/Orbits: Paranasal sinuses and mastoid air cells are unremarkable. Other: None IMPRESSION: No acute intracranial abnormality. Stable atrophy and extensive chronic white matter disease. No definite acute cortical infarction.  Stable chronic findings as described above. Electronically Signed   By: Natasha MeadLiviu  Pop M.D.   On: 04/01/2016 20:06   Dg Chest  Port 1 View  Result Date: 04/01/2016 CLINICAL DATA:  Confusion and difficulty ambulating.  Former smoker. EXAM: PORTABLE CHEST 1 VIEW COMPARISON:  07/15/2011 FINDINGS: Emphysematous hyperinflation of the lungs, upper lobe predominant with minimal crowding of lower lobe interstitial lung markings. There is atelectasis at the left lung base versus scarring. Eventration of the right hemidiaphragm is stable with colonic interposition over the liver shadow. The heart is normal in size. The aorta is slightly uncoiled without aneurysm. Atheromatous calcifications are seen of the aortic arch and descending aorta. No acute osseous abnormality. IMPRESSION: COPD without acute pulmonary disease. Electronically Signed   By: Tollie Ethavid  Kwon M.D.   On: 04/01/2016 19:47    ASSESSMENT AND PLAN:   Principal Problem:   Acute encephalopathy Active Problems:   Alzheimer's dementia without behavioral disturbance   Essential hypertension, benign   GERD   UTI (urinary tract infection)  *  Acute encephalopathy - due to the patient's underlying dementia in the setting of acute UTI, expect some improvement with treatment of underlying infection   cx awaited- enterococcus fecalis . Very weak.  *  UTI (urinary tract infection) - IV antibiotics, cultures sent, awaited sensitivity. *  Alzheimer's dementia without behavioral disturbance - continue home meds *  Essential hypertension, benign - continue home meds PT suggest SNF or memory care unit.  Wife told me , she may not be able to take care of him, if he is not able to stand up on his own.  All the records are reviewed and case discussed with Care Management/Social Worker. Management plans discussed with the patient, family and they are in agreement.  CODE STATUS: Full  TOTAL TIME TAKING CARE OF THIS PATIENT: 35 minutes.    POSSIBLE D/C IN 1-2 DAYS, DEPENDING ON CLINICAL CONDITION.   Altamese DillingVACHHANI, Doretha Goding M.D on 04/03/2016   Between 7am to 6pm - Pager -  [phone]  After 6pm go to [email or web address] - password [email or web address]  [facility] [place] [facility]  Office  [phone]  CC: Primary care physician; Tillman Abideichard Letvak, MD  Note: This dictation was prepared with Dragon dictation along with smaller phrase technology. Any transcriptional errors that result from this process are unintentional.

## 2016-04-03 NOTE — Clinical Social Work Note (Signed)
Clinical Social Work Assessment  Patient Details  Name: [name] [name] MRN: [id number] Date of Birth: 08/18/1926  Date of referral:  04/03/16               Reason for consult:  [place], Discharge Planning, Radio producer, Insurance Barriers                Permission sought to share information with:  Administrator, Civil Service granted to share information::  Yes, Verbal Permission Granted  Name::        Agency::     Relationship::     Contact Information:     Housing/Transportation Living arrangements for the past 2 months:  Assisted Living Facility, Single Family Home Source of Information:  Adult Children, Spouse Patient Interpreter Needed:  None Criminal Activity/Legal Involvement Pertinent to Current Situation/Hospitalization:  No - Comment as needed Significant Relationships:  Adult Children, Spouse Lives with:  Spouse Do you feel safe going back to the place where you live?    Need for family participation in patient care:  Yes (Comment)  Care giving concerns:  Patient lives in [place] with his wife [name] [name].    Social Worker assessment / plan:  Visual merchandiser (CSW) received verbal consult from MD that patient's wife [name] [name] and granddaughter [name] [name] would like to speak to CSW about discharge options. CSW contacted patient's granddaughter [name] [name] first to complete assessment. Patient was alone at bedside and not alert and oriented per RN. PT is recommending SNF/ Memory Care. Per [name] [name] patient currently lives with his wife in [place] and they have private duty caregivers that come in 2 hours in the morning and help change patient's sheets and get him ready. Per [name] [name] patient's sheets are soaked every morning because he is incontinent of urine. Per [name] [name] patient was a [facility] up until 4 months ago. [name] [name] reported that they did not have the funds to keep paying privately for [facility] and did not qualify for Medicaid at the time and moved some  funds around. [name] [name] described that Medicaid gave them a penalty and patient will not be eligible to re-apply for long term care Medicaid until Nov. 13th, 2017. Per [name] [name] the plan is for patient to get on long term care Medicaid and be placed at a facility. CSW explained to [place] that patient is under Medicare Observation status, which means that Medicare will not pay for SNF. CSW explained that patient can D/C to SNF from [facility] under private pay or go home with home health. Per granddaughter patient will go home with wife and re-apply for Medicaid. [name] [name] requested home health RN, Aide, PT and Child psychotherapist. CSW also contacted patient's wife [name] [name] and made her aware of above. Per wife they cannot pay privately for SNF and will take patient home with home health. Per wife she will transport patient home when he is ready for discharge. [name] [name] reported that patient's has several family members that live neat him that can provide support. RN case manager is aware of above. CSW will continue to follow and assist as needed.   Employment status:  Disabled (Comment on whether or not currently receiving Disability) Insurance information:  Medicare PT Recommendations:  Skilled Nursing Facility Information / Referral to community resources:  Other (Comment Required) (Patient is under Medicare Observation. )  Patient/Family's Response to care:  Patient's wife and granddaughter will take patient home with home health.   Patient/Family's Understanding of and Emotional Response to Diagnosis, Current Treatment, and Prognosis:  Patient's wife and grandaughter were very pleasant and thanked CSW for assistance.   Emotional Assessment Appearance:  Appears stated age Attitude/Demeanor/Rapport:  Unable to Assess Affect (typically observed):  Unable to Assess Orientation:  Oriented to Self, Fluctuating Orientation (Suspected and/or reported Sundowners) Alcohol / Substance use:  Not Applicable Psych involvement (Current and  /or in the community):  No (Comment)  Discharge Needs  Concerns to be addressed:  Discharge Planning Concerns Readmission within the last 30 days:  No Current discharge risk:  Chronically ill, Cognitively Impaired, Dependent with Mobility Barriers to Discharge:  Continued Medical Work up   [employer], [name], LCSW 04/03/2016, 4:05 PM

## 2016-04-03 NOTE — Evaluation (Signed)
Physical Therapy Evaluation Patient Details Name: [name] [name] MRN: [id number] DOB: 26-Aug-1926 Today's Date: 04/03/2016   History of Present Illness  Pt is a 82 y/o M who presents with increased confusion.  Pt has dementia at baseline but pt's wife stated that pt had been getting progressively more confused.  Pt with +UTI in ED.  Pt's PMH includes Alzheimer's disease, anxiety, venous insufficiency.      Clinical Impression  Pt admitted with above diagnosis. Pt currently with functional limitations due to the deficits listed below (see PT Problem List). [name] [name] was asleep during PT arrival and remained lethargic throughout session.  Pt unable to correctly answer questions correctly or provide any information.  The only question he answered appropraitely to was "what is your favorite color?" to which the pt responded, "blue".   No family present to provide information on PLOF, home layout, or assist available at d/c.  PT became agitated with attempt EOB and required total assist to achieve 75% sitting position.  Attempted to sit EOB x2 without success due to pt's resistance.  Performed therapeutic exercise with pt supine in bed and pt occasionally assisting.  Given pt's current mobility status and cognitive presentation, recommending SNF and would likely benefit from placement in a memory care unit.  Pt will benefit from skilled PT to increase their independence and safety with mobility to allow discharge to the venue listed below.      Follow Up Recommendations SNF;Other (comment) (Memory care unit)    Equipment Recommendations  Other (comment) (TBD at next venue of care)    Recommendations for Other Services       Precautions / Restrictions Precautions Precautions: Fall Restrictions Weight Bearing Restrictions: No      Mobility  Bed Mobility Overal bed mobility: Needs Assistance Bed Mobility: Supine to Sit     Supine to sit: Total assist;HOB elevated     General bed  mobility comments: Assist to advance BLEs off side of bed as pt not following verbal or tactile cues.  Hand over hand placement to cue pt to use bed rail.  However, pt required total assist to achieve 75% sitting position and pt resisting and becoming agitated.  Attempted again with same result and pt assisted back to sidelying.  Transfers                 General transfer comment: unable to assess at this time  Ambulation/Gait                Stairs            Wheelchair Mobility    Modified Rankin (Stroke Patients Only)       Balance                                             Pertinent Vitals/Pain Pain Assessment: Faces Faces Pain Scale: No hurt Pain Intervention(s): Monitored during session    Home Living Family/patient expects to be discharged to:: Unsure Living Arrangements: Spouse/significant other               Additional Comments: Per chart review pt likely living with wife although pt not able to confirm this.    Prior Function Level of Independence: Needs assistance         Comments: Unsure of assist needed at baseline as no family present.  Suspect that he  needed assist with at least ADLs but through examination he was most likely ambulatory.     Hand Dominance        Extremity/Trunk Assessment   Upper Extremity Assessment: Generalized weakness           Lower Extremity Assessment: Generalized weakness;RLE deficits/detail;LLE deficits/detail RLE Deficits / Details: Pt presents guarded but able to move into full knee extension, hip flexion, and to neutral DF Bil. LLE Deficits / Details: Pt presents guarded but able to move into full knee extension, hip flexion, and to neutral DF Bil.  Cervical / Trunk Assessment: Kyphotic  Communication   Communication: HOH  Cognition Arousal/Alertness: Lethargic Behavior During Therapy: Agitated;WFL for tasks assessed/performed Overall Cognitive Status: History of  cognitive impairments - at baseline                      General Comments General comments (skin integrity, edema, etc.): Pt unable to correctly answer questions correctly or provide any information.  The only question he answered appropraitely was "what is your favorite color" to which the pt responded, "blue".      Exercises General Exercises - Lower Extremity Ankle Circles/Pumps: AAROM;Both;20 reps;Supine Long Arc Quad: AAROM;Both;10 reps;Supine Hip Flexion/Marching: AAROM;Both;10 reps;Supine   Assessment/Plan    PT Assessment Patient needs continued PT services  PT Problem List Decreased strength;Decreased range of motion;Decreased activity tolerance;Decreased balance;Decreased mobility;Decreased cognition;Decreased knowledge of use of DME;Decreased safety awareness          PT Treatment Interventions DME instruction;Gait training;Functional mobility training;Therapeutic activities;Therapeutic exercise;Stair training;Balance training;Cognitive remediation;Patient/family education    PT Goals (Current goals can be found in the Care Plan section)  Acute Rehab PT Goals Patient Stated Goal: unable to state PT Goal Formulation: Patient unable to participate in goal setting Time For Goal Achievement: 04/17/16 Potential to Achieve Goals: Fair    Frequency Min 2X/week   Barriers to discharge   Unsure of amount of assist available at home or home layout    Co-evaluation               End of Session   Activity Tolerance: Treatment limited secondary to agitation Patient left: in bed;with call bell/phone within reach;with bed alarm set Nurse Communication: Mobility status    Functional Assessment Tool Used: Clinical Judgement Functional Limitation: Mobility: Walking and moving around Mobility: Walking and Moving Around Current Status ([id number]([id number]): At least 80 percent but less than 100 percent impaired, limited or restricted Mobility: Walking and Moving Around Goal  Status [phone](G8979): At least 40 percent but less than 60 percent impaired, limited or restricted    Time: 0932-0951 PT Time Calculation (min) (ACUTE ONLY): 19 min   Charges:   PT Evaluation $PT Eval Moderate Complexity: 1 Procedure PT Treatments $Therapeutic Exercise: 8-22 mins   PT G Codes:   PT G-Codes **NOT FOR INPATIENT CLASS** Functional Assessment Tool Used: Clinical Judgement Functional Limitation: Mobility: Walking and moving around Mobility: Walking and Moving Around Current Status ([id number](G8978): At least 80 percent but less than 100 percent impaired, limited or restricted Mobility: Walking and Moving Around Goal Status [phone](G8979): At least 40 percent but less than 60 percent impaired, limited or restricted    Encarnacion ChuAshley Abashian PT, DPT 04/03/2016, 10:08 AM

## 2016-04-03 NOTE — Plan of Care (Signed)
Problem: Education: Goal: Knowledge of [facility] Education information/materials will improve Outcome: Not Progressing Pt has hx of dementia  Problem: Safety: Goal: Ability to remain free from injury will improve Outcome: Progressing Pt is not impulsive at this time  Problem: Pain Managment: Goal: General experience of comfort will improve Outcome: Not Applicable Date Met: 94/49/67 Not complaining of pain

## 2016-04-03 NOTE — Progress Notes (Signed)
Pharmacy Antibiotic Note  [name] [name] is a 82 y.o. male admitted on 04/01/2016 with UTI.  Pharmacy has been consulted for ampicillin dosing.  Plan: Ampicillin 2g IV q6h - per micro lab sensitivities will not be back until tomorrow, please follow up.   Height: 5\' 9"  (175.3 cm) Weight: 194 lb (88 kg) IBW/kg (Calculated) : 70.7  Temp (24hrs), Avg:98.1 F (36.7 C), Min:97.7 F (36.5 C), Max:99.1 F (37.3 C)   Recent Labs Lab 04/01/16 1830 04/02/16 0458  WBC 6.3 6.2  CREATININE 1.07 0.91    Estimated Creatinine Clearance: 62.8 mL/min (by C-G formula based on SCr of 0.91 mg/dL).    Allergies  Allergen Reactions  . Codeine Other (See Comments)    Other Reaction: Not Assessed    Antimicrobials this admission: ceftriaxone  10/21>> 10/23 Ampicillin 10/23 >>  Dose adjustments this admission:   Microbiology results: 10/21 BCx: NGTD 10/21 UCx: >100 k E faecalis     10/21 UA: LE(+) NO2(-) WBC 6-30  Thank you for allowing pharmacy to be a part of this patient's care.  Marty HeckWang, Jorden Mahl L 04/03/2016 11:57 AM

## 2016-04-03 NOTE — Progress Notes (Signed)
Patient will not keep telemetry unit in place due to dementia. MD notified. Order to discontinue received.

## 2016-04-04 DIAGNOSIS — N390 Urinary tract infection, site not specified: Secondary | ICD-10-CM | POA: Diagnosis not present

## 2016-04-04 DIAGNOSIS — G309 Alzheimer's disease, unspecified: Secondary | ICD-10-CM | POA: Diagnosis not present

## 2016-04-04 DIAGNOSIS — G9340 Encephalopathy, unspecified: Secondary | ICD-10-CM | POA: Diagnosis not present

## 2016-04-04 DIAGNOSIS — I10 Essential (primary) hypertension: Secondary | ICD-10-CM | POA: Diagnosis not present

## 2016-04-04 DIAGNOSIS — Z7401 Bed confinement status: Secondary | ICD-10-CM | POA: Diagnosis not present

## 2016-04-04 LAB — URINE CULTURE: Culture: >=100000 — AB

## 2016-04-04 MED ORDER — LEVOFLOXACIN 500 MG PO TABS: 500.0000 mg | ORAL_TABLET | Freq: Every day | ORAL | 0 refills | Status: DC

## 2016-04-04 MED ORDER — ORAL CARE MOUTH RINSE
15.0000 mL | Freq: Two times a day (BID) | OROMUCOSAL | Status: DC
  Administered 2016-04-04: 15 mL via OROMUCOSAL

## 2016-04-04 MED ORDER — INFLUENZA VAC SPLIT QUAD 0.5 ML IM SUSY: 0.5000 mL | PREFILLED_SYRINGE | Freq: Once | INTRAMUSCULAR | Status: DC

## 2016-04-04 NOTE — Discharge Instructions (Signed)
Follow with PMD in 1-2 weeks. °

## 2016-04-04 NOTE — Discharge Summary (Signed)
[facility] - [place] at [facility]   PATIENT NAME: [name] [name]    MR#:  [id number]  DATE OF BIRTH:  Nov 18, 1926  DATE OF ADMISSION:  04/01/2016 ADMITTING PHYSICIAN: Oralia Manis, MD  DATE OF DISCHARGE: 04/04/2016  PRIMARY CARE PHYSICIAN: Tillman Abide, MD    ADMISSION DIAGNOSIS:  Lower urinary tract infectious disease [N39.0] Confusion [R41.0]  DISCHARGE DIAGNOSIS:  Principal Problem:   Acute encephalopathy Active Problems:   Alzheimer's dementia without behavioral disturbance   Essential hypertension, benign   GERD   UTI (urinary tract infection)   SECONDARY DIAGNOSIS:   Past Medical History:  Diagnosis Date  . Alzheimer disease   . ALZHEIMERS DISEASE 09/25/2008  . Anxiety   . ARTHRITIS 09/19/2006  . B12 DEFICIENCY 11/23/2008  . DIVERTICULOSIS, COLON, HX OF 09/19/2006  . GERD (gastroesophageal reflux disease)   . Hypertension   . HYPERTENSION 09/19/2006  . VENOUS INSUFFICIENCY 09/19/2006    HOSPITAL COURSE:   * Acute encephalopathy - due to the patient's underlying dementia in the setting of acute UTI, expect some improvement with treatment of underlying infection   cx grew- enterococcus fecalis . Change to oral Abx. *UTI (urinary tract infection) - IV antibiotics, cultures sent, checked sensitivity. *Alzheimer's dementia without behavioral disturbance - continue home meds * Essential hypertension, benign - continue home meds PT suggest SNF or memory care unit.  Wife told me , she may not be able to take care of him, if he is not able to stand up on his own.  But as there was no payer source, after discussion by CM and SW- family agreed to have him back home. They have applied for medicaid- and SW at home will help in placement in future.  DISCHARGE CONDITIONS:   Stable.  CONSULTS OBTAINED:    DRUG ALLERGIES:   Allergies  Allergen Reactions  . Codeine Other (See Comments)    Other Reaction: Not Assessed    DISCHARGE  MEDICATIONS:   Current Discharge Medication List    START taking these medications   Details  levofloxacin (LEVAQUIN) 500 MG tablet Take 1 tablet (500 mg total) by mouth daily. Qty: 4 tablet, Refills: 0      CONTINUE these medications which have NOT CHANGED   Details  acetaminophen (MAPAP ARTHRITIS PAIN) 650 MG CR tablet TAKE ONE TABLET BY MOUTH 3 TIMES A DAY *DO NOT CRUSH* Qty: 90 tablet, Refills: 11    citalopram (CELEXA) 10 MG tablet Take 1 tablet (10 mg total) by mouth daily. Qty: 30 tablet, Refills: 11    guaifenesin (ROBITUSSIN) 100 MG/5ML syrup Take 200 mg by mouth every 6 (six) hours as needed for cough.    loperamide (IMODIUM) 2 MG capsule Take 1 capsule (2 mg total) by mouth daily as needed. Qty: 30 capsule, Refills: 11    vitamin B-12 (CYANOCOBALAMIN) 1000 MCG tablet Take 1 tablet (1,000 mcg total) by mouth daily. Qty: 30 tablet, Refills: 11         DISCHARGE INSTRUCTIONS:    folow with PMD in 1-2 weeks.  If you experience worsening of your admission symptoms, develop shortness of breath, life threatening emergency, suicidal or homicidal thoughts you must seek medical attention immediately by calling 911 or calling your MD immediately  if symptoms less severe.  You Must read complete instructions/literature along with all the possible adverse reactions/side effects for all the Medicines you take and that have been prescribed to you. Take any new Medicines after you have completely understood and accept  all the possible adverse reactions/side effects.   Please note  You were cared for by a hospitalist during your hospital stay. If you have any questions about your discharge medications or the care you received while you were in the hospital after you are discharged, you can call the unit and asked to speak with the hospitalist on call if the hospitalist that took care of you is not available. Once you are discharged, your primary care physician will handle any  further medical issues. Please note that NO REFILLS for any discharge medications will be authorized once you are discharged, as it is imperative that you return to your primary care physician (or establish a relationship with a primary care physician if you do not have one) for your aftercare needs so that they can reassess your need for medications and monitor your lab values.    Today   CHIEF COMPLAINT:   Chief Complaint  Patient presents with  . Altered Mental Status    HISTORY OF PRESENT ILLNESS:  [name] [name]  is a 82 y.o. male presents with Increased confusion. Patient has dementia at baseline and cannot contribute to his own history. Wife is present at bedside in the ED and provides history. She states that for the last couple days he has been getting progressively more confused. She states these symptoms are consistent with prior urinary tract infection the patient had. Here in the ED he was noted to have the same on UA today. Antibiotics were started in the ED and hospitalists were called for admission.  VITAL SIGNS:  Blood pressure (!) 157/93, pulse (!) 49, temperature 97.9 F (36.6 C), temperature source Oral, resp. rate 18, height 5\' 9"  (1.753 m), weight 88 kg (194 lb), SpO2 99 %.  I/O:   Intake/Output Summary (Last 24 hours) at 04/04/16 1019 Last data filed at 04/04/16 1004  Gross per 24 hour  Intake              603 ml  Output                0 ml  Net              603 ml    PHYSICAL EXAMINATION:   Constitutional: He appears well-developedand well-nourished. No distress.  HENT:  Head: Normocephalicand atraumatic.  Mouth/Throat: Oropharynx is clear and moist.  Eyes: Conjunctivaeand EOMare normal. Pupils are equal, round, and reactive to light. No scleral icterus.  Neck: Normal range of motion. Neck supple. No JVDpresent. No thyromegalypresent.  Cardiovascular: Normal rate, regular rhythmand intact distal pulses. Exam reveals no gallopand no friction rub.   No murmurheard. Respiratory: Effort normaland breath sounds normal. No respiratory distress. He has no wheezes. He has no rales.  GI: Soft. Bowel sounds are normal. He exhibits no distension. There is no tenderness.  Musculoskeletal: Normal range of motion. He exhibits no edema.  No arthritis, no gout Lymphadenopathy:  He has no cervical adenopathy.  Neurological: He is alert. No cranial nerve deficit.  No dysarthria, no aphasia. Unable to fully assess due to patient's condition, have some baseline dementia. He moves limbs spontaneously. Skin: Skin is warmand dry. No rashnoted. No erythema.  Psychiatric:  Unable to assess due to patient's condition  DATA REVIEW:   CBC  Recent Labs Lab 04/02/16 0458  WBC 6.2  HGB 13.2  HCT 38.0*  PLT 191    Chemistries   Recent Labs Lab 04/01/16 1830 04/02/16 0458  NA 145 145  K 3.7 3.7  CL 115* 118*  CO2 24 23  GLUCOSE 94 94  BUN 21* 19  CREATININE 1.07 0.91  CALCIUM 9.1 8.4*  AST 17  --   ALT 12*  --   ALKPHOS 69  --   BILITOT 1.5*  --     Cardiac Enzymes  Recent Labs Lab 04/01/16 1830  TROPONINI <0.03    Microbiology Results  Results for orders placed or performed during the hospital encounter of 04/01/16  Urine culture     Status: Abnormal   Collection Time: 04/01/16  6:30 PM  Result Value Ref Range Status   Specimen Description URINE, RANDOM  Final   Special Requests NONE  Final   Culture >=100,000 COLONIES/mL ENTEROCOCCUS FAECALIS (A)  Final   Report Status 04/04/2016 FINAL  Final   Organism ID, Bacteria ENTEROCOCCUS FAECALIS (A)  Final      Susceptibility   Enterococcus faecalis - MIC*    AMPICILLIN <=2 SENSITIVE Sensitive     LEVOFLOXACIN 1 SENSITIVE Sensitive     NITROFURANTOIN <=16 SENSITIVE Sensitive     VANCOMYCIN 1 SENSITIVE Sensitive     * >=100,000 COLONIES/mL ENTEROCOCCUS FAECALIS  Culture, blood (routine x 2)     Status: None (Preliminary result)   Collection Time: 04/01/16  8:05 PM   Result Value Ref Range Status   Specimen Description BLOOD LEFT ARM  Final   Special Requests BOTTLES DRAWN AEROBIC AND ANAEROBIC 3CC  Final   Culture NO GROWTH 3 DAYS  Final   Report Status PENDING  Incomplete  Culture, blood (routine x 2)     Status: None (Preliminary result)   Collection Time: 04/01/16  8:05 PM  Result Value Ref Range Status   Specimen Description BLOOD LEFT ASSIST CONTROL  Final   Special Requests BOTTLES DRAWN AEROBIC AND ANAEROBIC 5CC  Final   Culture NO GROWTH 3 DAYS  Final   Report Status PENDING  Incomplete    RADIOLOGY:  No results found.  EKG:   Orders placed or performed during the hospital encounter of 04/01/16  . EKG 12-Lead  . EKG 12-Lead  . ED EKG  . ED EKG  . EKG      Management plans discussed with the patient, family and they are in agreement.  CODE STATUS:     Code Status Orders        Start     Ordered   04/01/16 2242  Full code  Continuous     04/01/16 2241    Code Status History    Date Active Date Inactive Code Status Order ID Comments User Context   07/16/2011  2:02 AM 07/19/2011  5:29 PM Full Code [id number]  Jacolyn ReedyArianna Deneanne Fulton, LPN Inpatient    Advance Directive Documentation   Flowsheet Row Most Recent Value  Type of Advance Directive  Healthcare Power of Attorney  Pre-existing out of facility DNR order (yellow form or pink MOST form)  No data  "MOST" Form in Place?  No data      TOTAL TIME TAKING CARE OF THIS PATIENT: 35 minutes.    Altamese DillingVACHHANI, Rilee Knoll M.D on 04/04/2016 at 10:19 AM  Between 7am to 6pm - Pager - [phone]  After 6pm go to [email or web address] - password [email or web address]  [facility]  Office  [phone]  CC: Primary care physician; Tillman Abideichard Letvak, MD   Note: This dictation was prepared with Dragon dictation along with smaller phrase technology. Any transcriptional errors that result from this process are unintentional.

## 2016-04-04 NOTE — Progress Notes (Signed)
Physical Therapy Treatment Patient Details Name: [name] [name] MRN: [id number] DOB: 01/14/1927 Today's Date: 04/04/2016    History of Present Illness Pt is a 82 y/o M who presents with increased confusion.  Pt has dementia at baseline but pt's wife stated that pt had been getting progressively more confused.  Pt with +UTI in ED.  Pt's PMH includes Alzheimer's disease, anxiety, venous insufficiency.      PT Comments    [name] [name] is not making progress toward therapy goals as he becomes agitated with any mobility.  He requires +2 total assist for bed mobility and attempt for sit>stand.  Unable to achieve sit>stand (attempted x3) due to pt's strong posterior bias and resistance.  Attempted to calm pt by playing classical music to place pt at ease.  He is very pleasant when resting comfortably in bed and during therapeutic exercise but resists any mobility.  SNF remains most appropriate d/c recommendation.  If SNF is not an option and pt is to return home he will need all the equipment listed below and will need a HH aide and HHPT.     Follow Up Recommendations  SNF;Other (comment) (Memory care unit)     Equipment Recommendations  Wheelchair (measurements PT);Wheelchair cushion (measurements PT);Rolling walker with 5" wheels;3in1 (PT);Hospital bed;Other (comment) (and hoyer lift; if he returns home vs. SNF)    Recommendations for Other Services       Precautions / Restrictions Precautions Precautions: Fall Restrictions Weight Bearing Restrictions: No    Mobility  Bed Mobility Overal bed mobility: Needs Assistance Bed Mobility: Supine to Sit     Supine to sit: Total assist;HOB elevated;+2 for physical assistance     General bed mobility comments: Total +2 assist for all aspects of bed mobility for supine<>sit.  Pt becomes agitated during bed mobility.  Transfers Overall transfer level: Needs assistance Equipment used: Rolling walker (2 wheeled);2 person hand held  assist Transfers: Sit to/from Stand Sit to Stand: Total assist;+2 physical assistance;From elevated surface         General transfer comment: Total +2 assist to attempt sit>stand x3 without success due to pt's strong posterior lean and resistance to transfer.    Ambulation/Gait             General Gait Details: Unable to assess   Stairs            Wheelchair Mobility    Modified Rankin (Stroke Patients Only)       Balance Overall balance assessment: Needs assistance Sitting-balance support: No upper extremity supported;Feet supported Sitting balance-Leahy Scale: Fair Sitting balance - Comments: Posterior bias but pt able to sit EOB without UE support for ~5 minutes Postural control: Posterior lean Standing balance support: Bilateral upper extremity supported;During functional activity Standing balance-Leahy Scale: Zero                      Cognition Arousal/Alertness: Lethargic Behavior During Therapy: Agitated;WFL for tasks assessed/performed Overall Cognitive Status: History of cognitive impairments - at baseline                      Exercises General Exercises - Upper Extremity Shoulder Flexion: AROM;AAROM;Both;10 reps;Supine General Exercises - Lower Extremity Ankle Circles/Pumps: Both;Supine;AROM;10 reps Heel Slides: AROM;AAROM;Both;20 reps;Supine Hip ABduction/ADduction: AROM;AAROM;Both;20 reps;Supine Straight Leg Raises: AROM;AAROM;Both;20 reps;Supine    General Comments General comments (skin integrity, edema, etc.): Played classical music in room in an effort to relax the patient in an effort to encourage participation.  Pertinent Vitals/Pain Pain Assessment: Faces Faces Pain Scale: No hurt Pain Intervention(s): Monitored during session    Home Living                      Prior Function            PT Goals (current goals can now be found in the care plan section) Acute Rehab PT Goals Patient Stated Goal:  unable to state PT Goal Formulation: Patient unable to participate in goal setting Time For Goal Achievement: 04/17/16 Potential to Achieve Goals: Fair Progress towards PT goals: Not progressing toward goals - comment (due to agitation)    Frequency    Min 2X/week      PT Plan Current plan remains appropriate    Co-evaluation             End of Session Equipment Utilized During Treatment: Gait belt Activity Tolerance: Treatment limited secondary to agitation Patient left: in bed;with call bell/phone within reach;with bed alarm set     Time: [id number] PT Time Calculation (min) (ACUTE ONLY): 38 min  Charges:  $Therapeutic Exercise: 23-37 mins $Therapeutic Activity: 8-22 mins                    G Codes:       Encarnacion ChuAshley Abashian PT, DPT 04/04/2016, 2:35 PM

## 2016-04-04 NOTE — Care Management Important Message (Signed)
Important Message  Patient Details  Name: [name] [name] MRN: [id number] Date of Birth: 03/21/1927   Medicare Important Message Given:  Yes    Marily MemosLisa M Jaxden Blyden, RN 04/04/2016, 11:11 AM

## 2016-04-04 NOTE — Care Management Note (Signed)
Case Management Note  Patient Details  Name: [name] [name] MRN: [id number] Date of Birth: 10/07/1926  Subjective/Objective:   Spoke with wife by phone to discuss home health services. Wife provided with a list of home health agencies. No preference. [facility], PT, HHA and SW. No DME needs. She is the caregiver and is tearful on the phone. Provided emotional support. Medical Necessity completed for EMS transport home.    Action/Plan: [facility] for SN, PT, HHA and SW.   Expected Discharge Date:     04/04/2016             Expected Discharge Plan:  Home w Home Health Services  In-House Referral:     Discharge planning Services  CM Consult  Post Acute Care Choice:  Home Health Choice offered to:  Spouse  DME Arranged:    DME Agency:     HH Arranged:  RN, PT, Nurse's Aide, Social Work [employer] Agency:  [facility]  Status of Service:  Completed, signed off  If discussed at [employer] Meetings, dates discussed:    Additional Comments:  Marily MemosLisa M Kaipo Ardis, RN 04/04/2016, 11:05 AM

## 2016-04-05 ENCOUNTER — Telehealth: Payer: Self-pay

## 2016-04-05 NOTE — Telephone Encounter (Signed)
Transition Care Management Follow-up Telephone Call    Date discharged? 04/04/16   How have you been since you were released from the hospital? Recovering.   Any patient concerns? Pt is experiencing generalized weakness and fatigue.    Items Reviewed:  Medications reviewed: Yes  Allergies reviewed: Yes  Dietary changes reviewed: N/A  Referrals reviewed: N/A   Functional Questionnaire:  Independent - I Dependent - D    Activities of Daily Living (ADLs):    Personal hygiene - I (may have assistance from spouse) Dressing - I (may have assistance from spouse) Eating - I Maintaining continence - D (fecal and urine; wears briefs) Transferring - I (walker as needed)  Independent Activities of Daily Living (iADLs): Pt is dependent with all iADLs.    Confirmed importance and date/time of follow-up visits scheduled YES  Provider Appointment booked with PCP 04/07/16 @ 1200   Confirmed with patient if condition begins to worsen call PCP or go to the ER.  Patient was given the office number and encouraged to call back with question or concerns: YES

## 2016-04-06 LAB — CULTURE, BLOOD (ROUTINE X 2)
CULTURE: NO GROWTH
Culture: NO GROWTH

## 2016-04-07 ENCOUNTER — Encounter (HOSPITAL_COMMUNITY): Payer: Self-pay | Admitting: Emergency Medicine

## 2016-04-07 ENCOUNTER — Inpatient Hospital Stay (HOSPITAL_COMMUNITY)
Admission: EM | Admit: 2016-04-07 | Discharge: 2016-04-10 | DRG: 091 | Disposition: A | Discharge status: Home Health | Payer: Medicare Other | Attending: Internal Medicine | Admitting: Internal Medicine

## 2016-04-07 ENCOUNTER — Ambulatory Visit: Payer: Medicare Other | Admitting: Internal Medicine

## 2016-04-07 ENCOUNTER — Emergency Department (HOSPITAL_COMMUNITY): Payer: Medicare Other

## 2016-04-07 ENCOUNTER — Observation Stay (HOSPITAL_COMMUNITY): Payer: Medicare Other

## 2016-04-07 DIAGNOSIS — R001 Bradycardia, unspecified: Secondary | ICD-10-CM | POA: Diagnosis not present

## 2016-04-07 DIAGNOSIS — Z8673 Personal history of transient ischemic attack (TIA), and cerebral infarction without residual deficits: Secondary | ICD-10-CM

## 2016-04-07 DIAGNOSIS — N3000 Acute cystitis without hematuria: Secondary | ICD-10-CM | POA: Diagnosis not present

## 2016-04-07 DIAGNOSIS — Z23 Encounter for immunization: Secondary | ICD-10-CM

## 2016-04-07 DIAGNOSIS — G301 Alzheimer's disease with late onset: Secondary | ICD-10-CM | POA: Diagnosis not present

## 2016-04-07 DIAGNOSIS — F39 Unspecified mood [affective] disorder: Secondary | ICD-10-CM | POA: Diagnosis present

## 2016-04-07 DIAGNOSIS — K219 Gastro-esophageal reflux disease without esophagitis: Secondary | ICD-10-CM | POA: Diagnosis present

## 2016-04-07 DIAGNOSIS — R410 Disorientation, unspecified: Secondary | ICD-10-CM | POA: Diagnosis not present

## 2016-04-07 DIAGNOSIS — Z885 Allergy status to narcotic agent status: Secondary | ICD-10-CM

## 2016-04-07 DIAGNOSIS — E538 Deficiency of other specified B group vitamins: Secondary | ICD-10-CM | POA: Diagnosis present

## 2016-04-07 DIAGNOSIS — F0280 Dementia in other diseases classified elsewhere without behavioral disturbance: Secondary | ICD-10-CM | POA: Diagnosis not present

## 2016-04-07 DIAGNOSIS — G309 Alzheimer's disease, unspecified: Secondary | ICD-10-CM | POA: Diagnosis present

## 2016-04-07 DIAGNOSIS — Z9842 Cataract extraction status, left eye: Secondary | ICD-10-CM

## 2016-04-07 DIAGNOSIS — Z841 Family history of disorders of kidney and ureter: Secondary | ICD-10-CM

## 2016-04-07 DIAGNOSIS — Z9841 Cataract extraction status, right eye: Secondary | ICD-10-CM

## 2016-04-07 DIAGNOSIS — W19XXXA Unspecified fall, initial encounter: Secondary | ICD-10-CM | POA: Diagnosis present

## 2016-04-07 DIAGNOSIS — R413 Other amnesia: Secondary | ICD-10-CM | POA: Diagnosis not present

## 2016-04-07 DIAGNOSIS — Z8261 Family history of arthritis: Secondary | ICD-10-CM

## 2016-04-07 DIAGNOSIS — Z9181 History of falling: Secondary | ICD-10-CM

## 2016-04-07 DIAGNOSIS — I10 Essential (primary) hypertension: Secondary | ICD-10-CM | POA: Diagnosis present

## 2016-04-07 DIAGNOSIS — Z820 Family history of epilepsy and other diseases of the nervous system: Secondary | ICD-10-CM

## 2016-04-07 DIAGNOSIS — S299XXA Unspecified injury of thorax, initial encounter: Secondary | ICD-10-CM | POA: Diagnosis not present

## 2016-04-07 DIAGNOSIS — R270 Ataxia, unspecified: Secondary | ICD-10-CM

## 2016-04-07 DIAGNOSIS — Z825 Family history of asthma and other chronic lower respiratory diseases: Secondary | ICD-10-CM

## 2016-04-07 DIAGNOSIS — Z79899 Other long term (current) drug therapy: Secondary | ICD-10-CM

## 2016-04-07 DIAGNOSIS — G9340 Encephalopathy, unspecified: Secondary | ICD-10-CM | POA: Diagnosis present

## 2016-04-07 DIAGNOSIS — I499 Cardiac arrhythmia, unspecified: Secondary | ICD-10-CM | POA: Diagnosis not present

## 2016-04-07 DIAGNOSIS — K5900 Constipation, unspecified: Secondary | ICD-10-CM | POA: Diagnosis present

## 2016-04-07 DIAGNOSIS — R4182 Altered mental status, unspecified: Secondary | ICD-10-CM | POA: Diagnosis not present

## 2016-04-07 DIAGNOSIS — Z87891 Personal history of nicotine dependence: Secondary | ICD-10-CM

## 2016-04-07 LAB — COMPREHENSIVE METABOLIC PANEL
ALBUMIN: 3.8 g/dL (ref 3.5–5.0)
ALT: 24 U/L (ref 17–63)
ANION GAP: 8 (ref 5–15)
AST: 37 U/L (ref 15–41)
Alkaline Phosphatase: 62 U/L (ref 38–126)
BUN: 18 mg/dL (ref 6–20)
CO2: 21 mmol/L — AB (ref 22–32)
Calcium: 9.3 mg/dL (ref 8.9–10.3)
Chloride: 112 mmol/L — ABNORMAL HIGH (ref 101–111)
Creatinine, Ser: 0.97 mg/dL (ref 0.61–1.24)
GFR calc Af Amer: >60 mL/min (ref >60)
GFR calc non Af Amer: >60 mL/min (ref >60)
GLUCOSE: 111 mg/dL — AB (ref 65–99)
POTASSIUM: 3.9 mmol/L (ref 3.5–5.1)
SODIUM: 141 mmol/L (ref 135–145)
TOTAL PROTEIN: 6.2 g/dL — AB (ref 6.5–8.1)
Total Bilirubin: 0.9 mg/dL (ref 0.3–1.2)

## 2016-04-07 LAB — URINALYSIS, ROUTINE W REFLEX MICROSCOPIC
Glucose, UA: NEGATIVE mg/dL
HGB URINE DIPSTICK: NEGATIVE
Ketones, ur: NEGATIVE mg/dL
LEUKOCYTES UA: NEGATIVE
NITRITE: NEGATIVE
Protein, ur: NEGATIVE mg/dL
SPECIFIC GRAVITY, URINE: 1.025 (ref 1.005–1.030)
pH: 6.5 (ref 5.0–8.0)

## 2016-04-07 LAB — CBC WITH DIFFERENTIAL/PLATELET
BASOS ABS: 0 10*3/uL (ref 0.0–0.1)
Basophils Relative: 0 %
Eosinophils Absolute: 0.1 10*3/uL (ref 0.0–0.7)
Eosinophils Relative: 1 %
HEMATOCRIT: 40.6 % (ref 39.0–52.0)
Hemoglobin: 14 g/dL (ref 13.0–17.0)
LYMPHS PCT: 20 %
Lymphs Abs: 1.4 10*3/uL (ref 0.7–4.0)
MCH: 33.6 pg (ref 26.0–34.0)
MCHC: 34.5 g/dL (ref 30.0–36.0)
MCV: 97.4 fL (ref 78.0–100.0)
MONO ABS: 0.8 10*3/uL (ref 0.1–1.0)
Monocytes Relative: 12 %
NEUTROS ABS: 4.7 10*3/uL (ref 1.7–7.7)
Neutrophils Relative %: 67 %
Platelets: 234 10*3/uL (ref 150–400)
RBC: 4.17 MIL/uL — ABNORMAL LOW (ref 4.22–5.81)
RDW: 14 % (ref 11.5–15.5)
WBC: 7 10*3/uL (ref 4.0–10.5)

## 2016-04-07 LAB — I-STAT TROPONIN, ED: Troponin i, poc: 0 ng/mL (ref 0.00–0.08)

## 2016-04-07 MED ORDER — ACETAMINOPHEN 650 MG RE SUPP: 650.0000 mg | Freq: Four times a day (QID) | RECTAL | Status: DC | PRN

## 2016-04-07 MED ORDER — VITAMIN B-12 1000 MCG PO TABS
1000.0000 ug | ORAL_TABLET | Freq: Every day | ORAL | Status: DC
  Administered 2016-04-08 – 2016-04-10 (×3): 1000 ug via ORAL
  Filled 2016-04-07 (×3): qty 1

## 2016-04-07 MED ORDER — LEVOFLOXACIN 500 MG PO TABS
500.0000 mg | ORAL_TABLET | Freq: Every evening | ORAL | Status: AC
  Administered 2016-04-07 – 2016-04-08 (×2): 500 mg via ORAL
  Filled 2016-04-07 (×2): qty 1

## 2016-04-07 MED ORDER — CITALOPRAM HYDROBROMIDE 20 MG PO TABS
10.0000 mg | ORAL_TABLET | Freq: Every day | ORAL | Status: DC
  Administered 2016-04-08 – 2016-04-10 (×3): 10 mg via ORAL
  Filled 2016-04-07 (×3): qty 1

## 2016-04-07 MED ORDER — ONDANSETRON HCL 4 MG PO TABS: 4.0000 mg | ORAL_TABLET | Freq: Four times a day (QID) | ORAL | Status: DC | PRN

## 2016-04-07 MED ORDER — ACETAMINOPHEN 325 MG PO TABS: 650.0000 mg | ORAL_TABLET | Freq: Four times a day (QID) | ORAL | Status: DC | PRN

## 2016-04-07 MED ORDER — ONDANSETRON HCL 4 MG/2ML IJ SOLN: 4.0000 mg | Freq: Four times a day (QID) | INTRAMUSCULAR | Status: DC | PRN

## 2016-04-07 NOTE — ED Notes (Addendum)
Attempted to walk patient with Dr Maryfrances Bunnellanford.  Patient stood up and continued to lean backwards - back to bed

## 2016-04-07 NOTE — ED Triage Notes (Signed)
Pt here from home with c/o confusion and multiple falls ,

## 2016-04-07 NOTE — H&P (Signed)
History and Physical  Patient Name: [name] [name]     QIO:[id number]    DOB: 03/14/1927    DOA: 04/07/2016 PCP: Tillman Abideichard Letvak, MD   Patient coming from: Home  Chief Complaint: Ataxia, fall, head injury  HPI: [name] DandyWesley O [place] is a 82 y.o. male with a past medical history significant for advanced dementia and HTN who presents with falls and inability to stand or walk.  The patient was in private pay nursing home until this past June, when the family decided to take the patient out of SNF for financial reasons.  He was ambulating independently and able to feed himself and toilet himself, just had advanced memory loss and so it seemed this would be a reasonable arrangement.    Wife explains that since bringing her husband home, he mostly doesn't know who she is, and he is unable to participate in a conversation, but he will try and he was ambulatory and somewhat functional, so they were able to care for him home alone with his wife since they had a caregiver for 2 hours a day and family help for several months.  His PCP attempted to arrange for Hospice care, but because he was able to feed himself he did not qualify for Hospice.    However, within the last few weeks, he has had a decline, seemed more unsteady on his feet, and fell several times. One week ago, he wouldn't stand, and was not responding well to questions so [facility] where he was diagnosed with acute encephalopathy from UTI and dehydration.  He was there in OBS status for 4 days, treated for UTI, got fluids, got PT (who recommended SNF or memory care unit) and was discharged to home last Tuesday.  At first after discharge, he was taking baby shuffling steps, but today he was extremely unsteady, and then fell, striking his head on the left side.  Afterwards he could not stand, and so family brought him to the ER.  ED course: -Afebrile, heart rate 57, respirations and pulse ox normal, BP 130/75 -Na 141, K 3.9, Cr 0.97,  WBC 7K, Hgb 14, troponin negative, UA improved from previous, CXR without focal opacity -ECG showed sinus bradycardia in the 50s without ST changes, similar to previous -He was observed in the ER for 4 hours, couldn't stand without 2 person assist, and so TRH were asked to evaluate      ROS: Review of Systems  Unable to perform ROS: Dementia          Past Medical History:  Diagnosis Date  . Alzheimer disease   . ALZHEIMERS DISEASE 09/25/2008  . Anxiety   . ARTHRITIS 09/19/2006  . B12 DEFICIENCY 11/23/2008  . DIVERTICULOSIS, COLON, HX OF 09/19/2006  . GERD (gastroesophageal reflux disease)   . Hypertension   . HYPERTENSION 09/19/2006  . VENOUS INSUFFICIENCY 09/19/2006    Past Surgical History:  Procedure Laterality Date  . APPENDECTOMY  1980  . BACK SURGERY  1982, 1992  . CATARACT EXTRACTION, BILATERAL  2004  . EYE SURGERY  2005   laser eye  . SHOULDER SURGERY  2004   right    Social History: Patient lives with his wife since June.  Before that, he was in SNF.  At recent baseline, the patient walks with a walker or if led by the hand can walk without one. He smoked years ago.  He ran a heavy equipment company.  [place]   Allergies  Allergen Reactions  . Codeine Nausea And Vomiting    Family history: family history includes Alzheimer's disease in his brother and mother; Arthritis in his brother; Asthma in his mother; COPD in his brother; Kidney disease in his father.  Prior to Admission medications   Medication Sig Start Date End Date Taking? Authorizing Provider  acetaminophen (MAPAP ARTHRITIS PAIN) 650 MG CR tablet TAKE ONE TABLET BY MOUTH 3 TIMES A DAY *DO NOT CRUSH* Patient taking differently: Take 650 mg by mouth 2 (two) times daily.  12/13/15  Yes Karie Schwalbe, MD  citalopram (CELEXA) 10 MG tablet Take 1 tablet (10 mg total) by mouth daily. 01/04/16  Yes Karie Schwalbe, MD  guaifenesin (ROBITUSSIN) 100 MG/5ML syrup Take 200 mg by mouth every 6 (six)  hours as needed for cough.   Yes Historical Provider, MD  levofloxacin (LEVAQUIN) 500 MG tablet Take 1 tablet (500 mg total) by mouth daily. Patient taking differently: Take 500 mg by mouth every evening.  04/04/16 04/08/16 Yes Altamese Dilling, MD  vitamin B-12 (CYANOCOBALAMIN) 1000 MCG tablet Take 1 tablet (1,000 mcg total) by mouth daily. 12/13/15  Yes Karie Schwalbe, MD       Physical Exam: BP 134/70   Pulse (!) 56   Temp 97.4 F (36.3 C) (Oral)   Resp 15   SpO2 100%  General appearance: Elderly male, awake but HOH and confused.  Mostly unable to follow even simple one step commands. Eyes: Anicteric, conjunctiva pink, lids and lashes normal. PERRL.   Family note bruising by left eye, I do not appreciate it. ENT: No nasal deformity, discharge, epistaxis.  Hearing poor. OP moist without lesions.   Neck: No neck masses.  Trachea midline.  No thyromegaly/tenderness. Lymph: No cervical or supraclavicular lymphadenopathy. Skin: Warm and dry.  No jaundice.  No suspicious rashes or lesions. Cardiac: RRR, nl S1-S2, no murmurs appreciated.  Capillary refill is brisk.  JVP normal.  No LE edema.  Radial and DP pulses 2+ and symmetric. Respiratory: Normal respiratory rate and rhythm.  CTAB without rales or wheezes. Abdomen: Abdomen soft.  No TTP. No ascites, distension, hepatosplenomegaly.   MSK: No deformities or effusions.  No cyanosis or clubbing. Neuro: PERRL.  No facial droop.  Tongue midline.  No dysphonia.  Speech is fluent.  Muscle strength 5/5 and equal.  Cannot follow commands for FTN.  Cannot ambulate.  When standing, he leans back, cannot be led to walk at all.    Psych: Awake, when asked his name, is able to agree, but when asked where he is, says "a little of both, I have 2 wheel drive," and he makes other nonsequitur remarks.        Labs on Admission:  I have personally reviewed following labs and imaging studies: CBC:  Recent Labs Lab 04/01/16 1830 04/02/16 0458  04/07/16 1547  WBC 6.3 6.2 7.0  NEUTROABS 4.4  --  4.7  HGB 14.6 13.2 14.0  HCT 42.7 38.0* 40.6  MCV 99.6 98.9 97.4  PLT 241 191 234   Basic Metabolic Panel:  Recent Labs Lab 04/01/16 1830 04/02/16 0458 04/07/16 1547  NA 145 145 141  K 3.7 3.7 3.9  CL 115* 118* 112*  CO2 24 23 21*  GLUCOSE 94 94 111*  BUN 21* 19 18  CREATININE 1.07 0.91 0.97  CALCIUM 9.1 8.4* 9.3   GFR: Estimated Creatinine Clearance: 58.9 mL/min (by C-G formula based on SCr of 0.97 mg/dL).  Liver Function Tests:  Recent Labs  Lab 04/01/16 1830 04/07/16 1547  AST 17 37  ALT 12* 24  ALKPHOS 69 62  BILITOT 1.5* 0.9  PROT 6.7 6.2*  ALBUMIN 3.8 3.8   No results for input(s): LIPASE, AMYLASE in the last 168 hours. No results for input(s): AMMONIA in the last 168 hours. Coagulation Profile: No results for input(s): INR, PROTIME in the last 168 hours. Cardiac Enzymes:  Recent Labs Lab 04/01/16 1830  TROPONINI <0.03   BNP (last 3 results) No results for input(s): PROBNP in the last 8760 hours. HbA1C: No results for input(s): HGBA1C in the last 72 hours. CBG: No results for input(s): GLUCAP in the last 168 hours. Lipid Profile: No results for input(s): CHOL, HDL, LDLCALC, TRIG, CHOLHDL, LDLDIRECT in the last 72 hours. Thyroid Function Tests: No results for input(s): TSH, T4TOTAL, FREET4, T3FREE, THYROIDAB in the last 72 hours. Anemia Panel: No results for input(s): VITAMINB12, FOLATE, FERRITIN, TIBC, IRON, RETICCTPCT in the last 72 hours. Sepsis Labs: Invalid input(s): PROCALCITONIN, LACTICIDVEN Recent Results (from the past 240 hour(s))  Urine culture     Status: Abnormal   Collection Time: 04/01/16  6:30 PM  Result Value Ref Range Status   Specimen Description URINE, RANDOM  Final   Special Requests NONE  Final   Culture >=100,000 COLONIES/mL ENTEROCOCCUS FAECALIS (A)  Final   Report Status 04/04/2016 FINAL  Final   Organism ID, Bacteria ENTEROCOCCUS FAECALIS (A)  Final       Susceptibility   Enterococcus faecalis - MIC*    AMPICILLIN <=2 SENSITIVE Sensitive     LEVOFLOXACIN 1 SENSITIVE Sensitive     NITROFURANTOIN <=16 SENSITIVE Sensitive     VANCOMYCIN 1 SENSITIVE Sensitive     * >=100,000 COLONIES/mL ENTEROCOCCUS FAECALIS  Culture, blood (routine x 2)     Status: None   Collection Time: 04/01/16  8:05 PM  Result Value Ref Range Status   Specimen Description BLOOD LEFT ARM  Final   Special Requests BOTTLES DRAWN AEROBIC AND ANAEROBIC 3CC  Final   Culture NO GROWTH 5 DAYS  Final   Report Status 04/06/2016 FINAL  Final  Culture, blood (routine x 2)     Status: None   Collection Time: 04/01/16  8:05 PM  Result Value Ref Range Status   Specimen Description BLOOD LEFT ASSIST CONTROL  Final   Special Requests BOTTLES DRAWN AEROBIC AND ANAEROBIC 5CC  Final   Culture NO GROWTH 5 DAYS  Final   Report Status 04/06/2016 FINAL  Final         Radiological Exams on Admission: Personally reviewed: Dg Chest 2 View  Result Date: 04/07/2016 CLINICAL DATA:  Fall. EXAM: CHEST  2 VIEW COMPARISON:  04/01/2016 FINDINGS: Mediastinum and hilar structures are stable. Low lung volumes. Interposition of the colon under the hemidiaphragms again noted. Stable mild cardiomegaly. No pulmonary venous congestion. No pleural effusion or pneumothorax. No acute bony abnormality. IMPRESSION: 1. Stable mild cardiomegaly. 2. Low lung volumes. Interposition of the colon hemidiaphragms again noted. No acute abnormality identified. Electronically Signed   By: Maisie Fus  Register   On: 04/07/2016 17:30    EKG: Independently reviewed. Rate 56, QTc 408, sinus rhyhtm.    Assessment/Plan  1. Ataxia:  Per family, this is new.  At baseline, he can walk without a walker if someone leads him, but in the ER tonight, he is completely unable to stand.  Ddx includes SDH given history of fall, stroke, B12 deficiency (although he takes oral B12), or progression of alzheimer's. -CT head and  if normal, MR  brain -Check B12 -Check orthostatics -PT eval   2. Dementia:  Patient requires 24 hour supervision as well as assistance with basic self cares.   -Consult to SW and CM  3. HTN:  Has been off meds for a while.  4. Mood disorder:  -Continue citalopram  5. UTI:  -Continue levaquin for 2 more doses         DVT prophylaxis: SCDs until CT head returns  Code Status: FULL  Family Communication: Wife and grand daughter at bedside  Disposition Plan: Anticipate OBS overnight, CT head and MR brain if negative.  Check B12 level and continue B12 supplement and PT eval.  If CT and MR are normal, will work with SW to find placement, likely to SNF, private pay? Consults called: SW, CM Admission status: OBS, med surg At the point of initial evaluation, it is my clinical opinion that admission for OBSERVATION is reasonable and necessary because the patient's presenting complaints in the context of their chronic conditions represent sufficient risk of deterioration or significant morbidity to constitute reasonable grounds for close observation in the hospital setting, but that the patient may be medically stable for discharge from the hospital within 24 to 48 hours.    Medical decision making: Patient seen at 7:20 PM on 04/07/2016.  The patient was discussed with Dr. Madilyn Hook.  What exists of the patient's chart was reviewed in depth and summarized above.  Clinical condition: stable.        Alberteen Sam [facility] Pager [phone]

## 2016-04-07 NOTE — ED Notes (Signed)
Patient back from CT.

## 2016-04-07 NOTE — Care Management Note (Signed)
Case Management Note  Patient Details  Name: [name] [name] MRN: [id number] Date of Birth: [birth year]  Subjective/Objective:           Patient presented to [facility] ED tonight s/p fall and difficulty walking. Patient has history advanced dementia,       Action/Plan: CM met with patient and wife at bedside,  wife reports that patient condition has worsened since discharge from [facility] 4 days ago. Patient was discharged with [place] visited today and advise them to bring him back to ED.  Discussed care goals with wife, she states they are wanting him placed in a Memory care unit, wife is away that insurance will not cover and this will be private pay. CSW consult placed.  Expected Discharge Date:                  Expected Discharge Plan:     In-House Referral:  Clinical Social Work  Discharge planning Services     Post Acute Care Choice:    Choice offered to:  Spouse  DME Arranged:    DME Agency:     HH Arranged:    [place] Agency:     Status of Service:  In process, will continue to follow  If discussed at Long Length of Stay Meetings, dates discussed:    Additional CommentsLaurena Slimmer, RN 04/07/2016, 11:04 PM

## 2016-04-07 NOTE — ED Notes (Signed)
Pt unable to ambulate on his own I person max assist for standing and back to bed

## 2016-04-07 NOTE — ED Provider Notes (Signed)
[facility]-EMERGENCY DEPT Provider Note   CSN: [id number] Arrival date & time: 04/07/16  1514     History   Chief Complaint Chief Complaint  Patient presents with  . Fall  . Altered Mental Status    HPI YOUSUF AGER is a 82 y.o. male.  The history is provided by the patient and the EMS personnel. No language interpreter was used.   KONNOR VONDRASEK is a 82 y.o. male who presents to the Emergency Department complaining of fall.  Level V caveat due to confusion.  Hx is provided by EMS.  Per report patient had a fall, he has a history of frequent falls and recent urinary tract infection. Patient denies any complaints.  Additional information available after family's arrival. Family reports that since Saturday the patient has had weakness and difficulty ambulating. He was seen at [facility] where he was admitted for urinary tract infection and started on Levaquin following a fall. He was able to ambulate yesterday but today he was found on the floor, partially under the bed with concern for unwitnessed fall. He has not been able to ambulate at all since the fall this morning. Family reports that his confusion has been waxing and waning over the last weeks but his gait difficulties are new since Saturday.  Past Medical History:  Diagnosis Date  . Alzheimer disease   . ALZHEIMERS DISEASE 09/25/2008  . Anxiety   . ARTHRITIS 09/19/2006  . B12 DEFICIENCY 11/23/2008  . DIVERTICULOSIS, COLON, HX OF 09/19/2006  . GERD (gastroesophageal reflux disease)   . Hypertension   . HYPERTENSION 09/19/2006  . VENOUS INSUFFICIENCY 09/19/2006    Patient Active Problem List   Diagnosis Date Noted  . Fall 04/07/2016  . Ataxia 04/07/2016  . Acute cystitis without hematuria 04/07/2016  . Acute encephalopathy 04/01/2016  . Malnutrition of mild degree (HCC) 12/30/2015  . Counseling regarding advanced directives 01/15/2014  . Diarrhea 12/30/2013  . Delusions (HCC) 10/02/2013  . UTI  (urinary tract infection) 07/15/2011  . Episodic mood disorder (HCC) 12/09/2009  . B12 deficiency 11/23/2008  . Alzheimer's dementia without behavioral disturbance 09/25/2008  . Essential hypertension, benign 09/19/2006  . VENOUS INSUFFICIENCY 09/19/2006  . Osteoarthritis, multiple sites 09/19/2006  . DIVERTICULOSIS, COLON, HX OF 09/19/2006  . GERD 03/01/2005    Past Surgical History:  Procedure Laterality Date  . APPENDECTOMY  1980  . BACK SURGERY  1982, 1992  . CATARACT EXTRACTION, BILATERAL  2004  . EYE SURGERY  2005   laser eye  . SHOULDER SURGERY  2004   right       Home Medications    Prior to Admission medications   Medication Sig Start Date End Date Taking? Authorizing Provider  acetaminophen (MAPAP ARTHRITIS PAIN) 650 MG CR tablet TAKE ONE TABLET BY MOUTH 3 TIMES A DAY *DO NOT CRUSH* Patient taking differently: Take 650 mg by mouth 2 (two) times daily.  12/13/15  Yes Karie Schwalbe, MD  citalopram (CELEXA) 10 MG tablet Take 1 tablet (10 mg total) by mouth daily. 01/04/16  Yes Karie Schwalbe, MD  guaifenesin (ROBITUSSIN) 100 MG/5ML syrup Take 200 mg by mouth every 6 (six) hours as needed for cough.   Yes Historical Provider, MD  levofloxacin (LEVAQUIN) 500 MG tablet Take 1 tablet (500 mg total) by mouth daily. Patient taking differently: Take 500 mg by mouth every evening.  04/04/16 04/08/16 Yes Altamese Dilling, MD  vitamin B-12 (CYANOCOBALAMIN) 1000 MCG tablet Take 1 tablet (1,000 mcg total)  by mouth daily. 12/13/15  Yes Karie Schwalbeichard I Letvak, MD    Family History Family History  Problem Relation Age of Onset  . Asthma Mother   . Alzheimer's disease Mother   . Kidney disease Father   . COPD Brother   . Alzheimer's disease Brother   . Arthritis Brother     Social History Social History  Substance Use Topics  . Smoking status: Former Smoker    Years: 50.00    Types: Cigarettes  . Smokeless tobacco: Never Used  . Alcohol use No     Allergies     Codeine   Review of Systems Review of Systems  All other systems reviewed and are negative.    Physical Exam Updated Vital Signs BP 136/89 (BP Location: Right Arm)   Pulse 63   Temp 98.2 F (36.8 C)   Resp 19   SpO2 93%   Physical Exam  Constitutional: He appears well-developed and well-nourished.  HENT:  Head: Normocephalic and atraumatic.  Neck:  No C-spine tenderness  Cardiovascular: Normal rate and regular rhythm.   No murmur heard. Pulmonary/Chest: Effort normal and breath sounds normal. No respiratory distress.  Abdominal: Soft. There is no tenderness. There is no rebound and no guarding.  Musculoskeletal: He exhibits no edema or tenderness.  Neurological: He is alert.  Moves all extremities symmetrically. Disoriented to place and time. Pleasantly confused  Skin: Skin is warm and dry.  Psychiatric: He has a normal mood and affect. His behavior is normal.  Nursing note and vitals reviewed.    ED Treatments / Results  Labs (all labs ordered are listed, but only abnormal results are displayed) Labs Reviewed  COMPREHENSIVE METABOLIC PANEL - Abnormal; Notable for the following:       Result Value   Chloride 112 (*)    CO2 21 (*)    Glucose, Bld 111 (*)    Total Protein 6.2 (*)    All other components within normal limits  CBC WITH DIFFERENTIAL/PLATELET - Abnormal; Notable for the following:    RBC 4.17 (*)    All other components within normal limits  URINALYSIS, ROUTINE W REFLEX MICROSCOPIC (NOT AT [facility]) - Abnormal; Notable for the following:    Color, Urine AMBER (*)    Bilirubin Urine SMALL (*)    All other components within normal limits  I-STAT TROPOININ, ED    EKG  EKG Interpretation  Date/Time:  Friday April 07 2016 15:41:46 EDT Ventricular Rate:  56 PR Interval:    QRS Duration: 93 QT Interval:  422 QTC Calculation: 408 R Axis:   10 Text Interpretation:  Sinus rhythm Short PR interval Low voltage, precordial leads Confirmed by Lincoln Brighamees, Liz  [phone]([id number]) on 04/07/2016 4:38:25 PM       Radiology Dg Chest 2 View  Result Date: 04/07/2016 CLINICAL DATA:  Fall. EXAM: CHEST  2 VIEW COMPARISON:  04/01/2016 FINDINGS: Mediastinum and hilar structures are stable. Low lung volumes. Interposition of the colon under the hemidiaphragms again noted. Stable mild cardiomegaly. No pulmonary venous congestion. No pleural effusion or pneumothorax. No acute bony abnormality. IMPRESSION: 1. Stable mild cardiomegaly. 2. Low lung volumes. Interposition of the colon hemidiaphragms again noted. No acute abnormality identified. Electronically Signed   By: Maisie Fushomas  Register   On: 04/07/2016 17:30   Ct Head Wo Contrast  Result Date: 04/07/2016 CLINICAL DATA:  Confusion, multiple falls.  Unsteady gait EXAM: CT HEAD WITHOUT CONTRAST TECHNIQUE: Contiguous axial images were obtained from the base of the skull through the  vertex without intravenous contrast. COMPARISON:  04/01/2016 FINDINGS: Brain: No acute territorial infarction or intracranial hemorrhage. No new mass, mass effect or midline shift. Moderate atrophy. Moderate-to-marked periventricular white matter small vessel ischemic disease. 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 Stable slightly enlarged ventricles likely related to atrophy. Vascular: No hyperdense vessels. Calcifications present within the carotid arteries at the skullbase. Skull: Mastoid air cells clear. There is no skull fracture. Mild bony thickening of the walls of the sphenoid sinus, also unchanged. Sinuses/Orbits: Moderate mucosal thickening in the ethmoid and maxillary sinuses with polyp or mucous retention cyst in the right maxillary sinus. No acute orbital abnormality. Other: None IMPRESSION: 1. No CT evidence for acute intracranial abnormality. 2. Extensive periventricular white matter small vessel ischemic disease. 3. 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   On: 04/07/2016 22:41    Procedures Procedures (including critical care time)  Medications Ordered in ED Medications  levofloxacin (LEVAQUIN) tablet 500 mg (500 mg Oral Given 04/07/16 2326)  citalopram (CELEXA) tablet 10 mg (not administered)  vitamin B-12 (CYANOCOBALAMIN) tablet 1,000 mcg (not administered)  ondansetron (ZOFRAN) tablet 4 mg (not administered)    Or  ondansetron (ZOFRAN) injection 4 mg (not administered)  acetaminophen (TYLENOL) tablet 650 mg (not administered)    Or  acetaminophen (TYLENOL) suppository 650 mg (not administered)     Initial Impression / Assessment and Plan / ED Course  I have reviewed the triage vital signs and the nursing notes.  Pertinent labs & imaging results that were available during my care of the patient were reviewed by me and considered in my medical decision making (see chart for details).  Clinical Course  Patient with history of Alzheimer's here with difficulty walking following unwitnessed fall earlier today. He is pleasantly confused on exam. He is unable to stand without 2 person assist. On attempt to stand patient falls backwards. No evidence of acute urinary tract infection. Exam with no evidence of head injury. Hospitalist consulted for gait difficulties following multiple falls.   Final Clinical Impressions(s) / ED Diagnoses   Final diagnoses:  Fall    New Prescriptions Current Discharge Medication List       Tilden Fossa, MD 04/07/16 2353

## 2016-04-08 ENCOUNTER — Observation Stay (HOSPITAL_COMMUNITY): Payer: Medicare Other

## 2016-04-08 DIAGNOSIS — I10 Essential (primary) hypertension: Secondary | ICD-10-CM | POA: Diagnosis present

## 2016-04-08 DIAGNOSIS — R259 Unspecified abnormal involuntary movements: Secondary | ICD-10-CM | POA: Diagnosis not present

## 2016-04-08 DIAGNOSIS — R270 Ataxia, unspecified: Secondary | ICD-10-CM | POA: Diagnosis not present

## 2016-04-08 DIAGNOSIS — Z9841 Cataract extraction status, right eye: Secondary | ICD-10-CM | POA: Diagnosis not present

## 2016-04-08 DIAGNOSIS — N3000 Acute cystitis without hematuria: Secondary | ICD-10-CM | POA: Diagnosis present

## 2016-04-08 DIAGNOSIS — R531 Weakness: Secondary | ICD-10-CM | POA: Diagnosis not present

## 2016-04-08 DIAGNOSIS — Z841 Family history of disorders of kidney and ureter: Secondary | ICD-10-CM | POA: Diagnosis not present

## 2016-04-08 DIAGNOSIS — E538 Deficiency of other specified B group vitamins: Secondary | ICD-10-CM | POA: Diagnosis present

## 2016-04-08 DIAGNOSIS — Z8261 Family history of arthritis: Secondary | ICD-10-CM | POA: Diagnosis not present

## 2016-04-08 DIAGNOSIS — K5900 Constipation, unspecified: Secondary | ICD-10-CM | POA: Diagnosis present

## 2016-04-08 DIAGNOSIS — Z885 Allergy status to narcotic agent status: Secondary | ICD-10-CM | POA: Diagnosis not present

## 2016-04-08 DIAGNOSIS — F0280 Dementia in other diseases classified elsewhere without behavioral disturbance: Secondary | ICD-10-CM | POA: Diagnosis present

## 2016-04-08 DIAGNOSIS — Z820 Family history of epilepsy and other diseases of the nervous system: Secondary | ICD-10-CM | POA: Diagnosis not present

## 2016-04-08 DIAGNOSIS — G309 Alzheimer's disease, unspecified: Secondary | ICD-10-CM | POA: Diagnosis present

## 2016-04-08 DIAGNOSIS — Z8673 Personal history of transient ischemic attack (TIA), and cerebral infarction without residual deficits: Secondary | ICD-10-CM | POA: Diagnosis not present

## 2016-04-08 DIAGNOSIS — Z9181 History of falling: Secondary | ICD-10-CM | POA: Diagnosis not present

## 2016-04-08 DIAGNOSIS — Z87891 Personal history of nicotine dependence: Secondary | ICD-10-CM | POA: Diagnosis not present

## 2016-04-08 DIAGNOSIS — Z79899 Other long term (current) drug therapy: Secondary | ICD-10-CM | POA: Diagnosis not present

## 2016-04-08 DIAGNOSIS — W19XXXA Unspecified fall, initial encounter: Secondary | ICD-10-CM | POA: Diagnosis present

## 2016-04-08 DIAGNOSIS — R4182 Altered mental status, unspecified: Secondary | ICD-10-CM | POA: Diagnosis not present

## 2016-04-08 DIAGNOSIS — K219 Gastro-esophageal reflux disease without esophagitis: Secondary | ICD-10-CM | POA: Diagnosis present

## 2016-04-08 DIAGNOSIS — F39 Unspecified mood [affective] disorder: Secondary | ICD-10-CM | POA: Diagnosis present

## 2016-04-08 DIAGNOSIS — G9340 Encephalopathy, unspecified: Secondary | ICD-10-CM | POA: Diagnosis present

## 2016-04-08 DIAGNOSIS — Z9842 Cataract extraction status, left eye: Secondary | ICD-10-CM | POA: Diagnosis not present

## 2016-04-08 DIAGNOSIS — Z825 Family history of asthma and other chronic lower respiratory diseases: Secondary | ICD-10-CM | POA: Diagnosis not present

## 2016-04-08 DIAGNOSIS — Z23 Encounter for immunization: Secondary | ICD-10-CM | POA: Diagnosis not present

## 2016-04-08 LAB — VITAMIN B12: Vitamin B-12: 1245 pg/mL — ABNORMAL HIGH (ref 180–914)

## 2016-04-08 MED ORDER — LORAZEPAM 2 MG/ML IJ SOLN: 0.5000 mg | Freq: Once | INTRAMUSCULAR | Status: DC | PRN

## 2016-04-08 MED ORDER — ENOXAPARIN SODIUM 40 MG/0.4ML ~~LOC~~ SOLN
40.0000 mg | SUBCUTANEOUS | Status: DC
  Administered 2016-04-08 – 2016-04-10 (×3): 40 mg via SUBCUTANEOUS
  Filled 2016-04-08 (×3): qty 0.4

## 2016-04-08 MED ORDER — LORAZEPAM 0.5 MG PO TABS
0.5000 mg | ORAL_TABLET | Freq: Once | ORAL | Status: AC | PRN
  Administered 2016-04-08: 0.5 mg via ORAL
  Filled 2016-04-08: qty 1

## 2016-04-08 NOTE — Progress Notes (Signed)
[facility] Progress Note  Patient: [name] [name] ZOX:[id number]   PCP: Tillman Abideichard Letvak, MD DOB: 10/27/1926   DOA: 04/07/2016   DOS: 04/08/2016   Date of Service: the patient was seen and examined on 04/08/2016  Brief hospital course: Pt. with PMH of Advanced dementia, HTN; admitted on 04/07/2016, with complaint of recurrent fall, was found to have ataxia. Currently further plan is monitor closely.  Assessment and Plan: 1. Ataxia CT head is unremarkable. MRI brain is very poor study, but no evidence of acute stroke. We'll continue serial neuro checks at present. PTOT consult. B-12 normal, orthostatic vitals unable to check,  2. Dementia:  Patient requires 24 hour supervision as well as assistance with basic self cares.   -Consult to SW and CM  3. HTN:  Has been off meds for a while.  4. Mood disorder:  -Continue citalopram  5. UTI:  -Continue levaquin for 2 more doses  Pain management: When necessary Tylenol Activity: SNF as per physical therapy Bowel regimen: last BM prior to admission Diet: Mechanical soft per home regimen DVT Prophylaxis: subcutaneous Heparin  Advance goals of care discussion: Full code  Family Communication: no family was present at bedside, at the time of interview.  Disposition:  Discharge to SNF. Expected discharge date: 04/10/2016, improvement in mentation  Consultants: None Procedures: None  Antibiotics: Anti-infectives    Start     Dose/Rate Route Frequency Ordered Stop   04/07/16 2215  levofloxacin (LEVAQUIN) tablet 500 mg     500 mg Oral Every evening 04/07/16 2212 04/08/16 1649     Subjective: Remains confused. Unable to follow command. No acute events overnight.  Objective: Physical Exam: Vitals:   04/07/16 2225 04/08/16 0100 04/08/16 0521 04/08/16 1411  BP: 136/89  (!) 141/93 116/67  Pulse: 63  73 (!) 55  Resp:   20 17  Temp:   98.4 F (36.9 C) 99 F (37.2 C)  TempSrc:   Oral   SpO2: 93%  96% 100%  Weight:   94.8 kg (209 lb)    Height:  5\' 9"  (1.753 m)     No intake or output data in the 24 hours ending 04/08/16 1828 Filed Weights   04/08/16 0100  Weight: 94.8 kg (209 lb)    General: Alert, Awake and Oriented to none, unable to follow any command. Appear in mild distress, affect appropriate Eyes: PERRL, Conjunctiva normal ENT: Oral Mucosa clear moist. Neck: difficult to assess JVD, no Abnormal Mass Or lumps Cardiovascular: S1 and S2 Present, no Murmur, Respiratory: Bilateral Air entry equal and Decreased, no use of accessory muscle, Clear to Auscultation, no Crackles, no wheezes Abdomen: Bowel Sound present, Soft and no tenderness Skin: no redness, no Rash, no induration Extremities: no Pedal edema, no calf tenderness Neurologic: Grossly no focal neuro deficit. Bilaterally Equal motor strength  Data Reviewed: CBC:  Recent Labs Lab 04/01/16 1830 04/02/16 0458 04/07/16 1547  WBC 6.3 6.2 7.0  NEUTROABS 4.4  --  4.7  HGB 14.6 13.2 14.0  HCT 42.7 38.0* 40.6  MCV 99.6 98.9 97.4  PLT 241 191 234   Basic Metabolic Panel:  Recent Labs Lab 04/01/16 1830 04/02/16 0458 04/07/16 1547  NA 145 145 141  K 3.7 3.7 3.9  CL 115* 118* 112*  CO2 24 23 21*  GLUCOSE 94 94 111*  BUN 21* 19 18  CREATININE 1.07 0.91 0.97  CALCIUM 9.1 8.4* 9.3    Liver Function Tests:  Recent Labs Lab 04/01/16 1830 04/07/16 1547  AST 17 37  ALT 12* 24  ALKPHOS 69 62  BILITOT 1.5* 0.9  PROT 6.7 6.2*  ALBUMIN 3.8 3.8   No results for input(s): LIPASE, AMYLASE in the last 168 hours. No results for input(s): AMMONIA in the last 168 hours. Coagulation Profile: No results for input(s): INR, PROTIME in the last 168 hours. Cardiac Enzymes:  Recent Labs Lab 04/01/16 1830  TROPONINI <0.03   BNP (last 3 results) No results for input(s): PROBNP in the last 8760 hours.  CBG: No results for input(s): GLUCAP in the last 168 hours.  Studies: Ct Head Wo Contrast  Result Date: 04/07/2016 CLINICAL  DATA:  Confusion, multiple falls.  Unsteady gait EXAM: CT HEAD WITHOUT CONTRAST TECHNIQUE: Contiguous axial images were obtained from the base of the skull through the vertex without intravenous contrast. COMPARISON:  04/01/2016 FINDINGS: Brain: No acute territorial infarction or intracranial hemorrhage. No new mass, mass effect or midline shift. Moderate atrophy. Moderate-to-marked periventricular white matter small vessel ischemic disease. 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 Stable slightly enlarged ventricles likely related to atrophy. Vascular: No hyperdense vessels. Calcifications present within the carotid arteries at the skullbase. Skull: Mastoid air cells clear. There is no skull fracture. Mild bony thickening of the walls of the sphenoid sinus, also unchanged. Sinuses/Orbits: Moderate mucosal thickening in the ethmoid and maxillary sinuses with polyp or mucous retention cyst in the right maxillary sinus. No acute orbital abnormality. Other: None IMPRESSION: 1. No CT evidence for acute intracranial abnormality. 2. Extensive periventricular white matter small vessel ischemic disease. 3. 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   On: 04/07/2016 22:41   Mr Brain Wo Contrast  Result Date: 04/08/2016 CLINICAL DATA:  Ataxia. EXAM: MRI HEAD WITHOUT CONTRAST TECHNIQUE: Multiplanar, multiecho pulse sequences of the brain and surrounding structures were obtained without intravenous contrast. COMPARISON:  CT head 04/07/2016 FINDINGS: Limited study. The patient was not able to complete the study. There is considerable motion on the images obtained Brain: Moderate to advanced atrophy. Negative for acute infarct. Moderate chronic microvascular ischemic change. No mass or shift of the midline structures Paranasal sinuses grossly normal. IMPRESSION: Incomplete study degraded by motion Moderate to advanced atrophy. Chronic  microvascular ischemic change in the white matter. No acute infarct. Electronically Signed   By: Marlan Palauharles  Clark M.D.   On: 04/08/2016 12:28     Scheduled Meds: . citalopram  10 mg Oral Daily  . enoxaparin (LOVENOX) injection  40 mg Subcutaneous Q24H  . vitamin B-12  1,000 mcg Oral Daily   Continuous Infusions:  PRN Meds: acetaminophen **OR** acetaminophen, ondansetron **OR** ondansetron (ZOFRAN) IV  Time spent: 30 minutes  Author: Lynden OxfordPranav Terry Abila, MD [facility] Pager: [phone] 04/08/2016 6:28 PM  If 7PM-7AM, please contact night-coverage at [email or web address], password [email or web address]

## 2016-04-08 NOTE — Evaluation (Signed)
Physical Therapy Evaluation Patient Details Name: [name] [name] MRN: [id number] DOB: 02/19/1927 Today's Date: 04/08/2016   History of Present Illness  pt is an 82 y/o male with pmh of advance dementia and HTN presenting with falls and inability to stand or walk.  Clinical Impression  Pt admitted with/for falls and inability to stand/walk.  Pt currently limited functionally due to the problems listed. ( See problems list.)   Pt will benefit from PT to maximize function and safety in order to get ready for next venue listed below.     Follow Up Recommendations SNF;Other (comment) (bridge to restorative care.)    Equipment Recommendations       Recommendations for Other Services       Precautions / Restrictions Precautions Precautions: Fall      Mobility  Bed Mobility Overal bed mobility: Needs Assistance Bed Mobility: Supine to Sit;Sit to Supine     Supine to sit: Mod assist Sit to supine: Max assist   General bed mobility comments: cue verbal and physical for direction and pt was generally happy to do me a "favor"  Transfers Overall transfer level: Needs assistance Equipment used: Rolling walker (2 wheeled);2 person hand held assist Transfers: Sit to/from Stand Sit to Stand: Mod assist (2 person assist would be helpful)         General transfer comment: assist to come forward and stand.  stability assist  Ambulation/Gait Ambulation/Gait assistance: Mod assist Ambulation Distance (Feet): 22 Feet Assistive device: Rolling walker (2 wheeled) Gait Pattern/deviations: Step-through pattern     General Gait Details: mildly unsteady gait, less ataxic than uncoordinated.  physical/verbal cues to keep focused on task of walking and cues to keep pt up in the RW.  pt could be directed fairly well today with modrate cueing.  Stairs            Wheelchair Mobility    Modified Rankin (Stroke Patients Only)       Balance Overall balance assessment: Needs  assistance   Sitting balance-Leahy Scale: Fair       Standing balance-Leahy Scale: Poor Standing balance comment: needs external support                             Pertinent Vitals/Pain Pain Assessment: Faces Faces Pain Scale: No hurt    Home Living Family/patient expects to be discharged to:: Skilled nursing facility (memory care unit) Living Arrangements: Spouse/significant other               Additional Comments: pt living with wife lately, home from SNF due to financial reasons, but likely going back to memory care unit.    Prior Function Level of Independence: Needs assistance         Comments: likely needing assist for all mobility, ADLS     Hand Dominance        Extremity/Trunk Assessment   Upper Extremity Assessment: Generalized weakness (but functional)           Lower Extremity Assessment: Generalized weakness (grossly 3+ to 4/5 and functional for assisted ambulation)         Communication   Communication: HOH  Cognition Arousal/Alertness: Awake/alert Behavior During Therapy: WFL for tasks assessed/performed Overall Cognitive Status: History of cognitive impairments - at baseline                      General Comments General comments (skin integrity, edema, etc.): unable to get  any home or PLOF question answered due to dementias    Exercises     Assessment/Plan    PT Assessment Patient needs continued PT services  PT Problem List Decreased strength;Decreased activity tolerance;Decreased mobility;Decreased coordination;Decreased knowledge of use of DME          PT Treatment Interventions DME instruction;Gait training;Functional mobility training;Therapeutic activities;Therapeutic exercise;Balance training;Patient/family education    PT Goals (Current goals can be found in the Care Plan section)  Acute Rehab PT Goals Patient Stated Goal: unable to state PT Goal Formulation: Patient unable to participate in  goal setting Time For Goal Achievement: 04/15/16 Potential to Achieve Goals: Fair    Frequency Min 2X/week   Barriers to discharge        Co-evaluation               End of Session   Activity Tolerance: Patient tolerated treatment well Patient left: in bed;with call bell/phone within reach;with bed alarm set Nurse Communication: Mobility status         Time: [id number] PT Time Calculation (min) (ACUTE ONLY): 26 min   Charges:   PT Evaluation $PT Eval Low Complexity: 1 Procedure PT Treatments $Gait Training: 8-22 mins   PT G Codes:        Covey Baller, Eliseo GumKenneth V 04/08/2016, 4:14 PM 04/08/2016  [place] BingKen Osie Amparo, PT [phone]  (pager)

## 2016-04-08 NOTE — Progress Notes (Signed)
Pt will not lay flat and stop lifting head to cooperate for MRI. Can attempt again during the day,

## 2016-04-09 LAB — CBC
HCT: 39.4 % (ref 39.0–52.0)
HEMOGLOBIN: 13 g/dL (ref 13.0–17.0)
MCH: 33.1 pg (ref 26.0–34.0)
MCHC: 33 g/dL (ref 30.0–36.0)
MCV: 100.3 fL — ABNORMAL HIGH (ref 78.0–100.0)
Platelets: 203 10*3/uL (ref 150–400)
RBC: 3.93 MIL/uL — AB (ref 4.22–5.81)
RDW: 14.4 % (ref 11.5–15.5)
WBC: 4.9 10*3/uL (ref 4.0–10.5)

## 2016-04-09 LAB — BASIC METABOLIC PANEL
Anion gap: 4 — ABNORMAL LOW (ref 5–15)
BUN: 13 mg/dL (ref 6–20)
CHLORIDE: 112 mmol/L — AB (ref 101–111)
CO2: 24 mmol/L (ref 22–32)
CREATININE: 0.92 mg/dL (ref 0.61–1.24)
Calcium: 9.4 mg/dL (ref 8.9–10.3)
GFR calc non Af Amer: >60 mL/min (ref >60)
Glucose, Bld: 94 mg/dL (ref 65–99)
Potassium: 3.7 mmol/L (ref 3.5–5.1)
SODIUM: 140 mmol/L (ref 135–145)

## 2016-04-09 LAB — MAGNESIUM: MAGNESIUM: 2.4 mg/dL (ref 1.7–2.4)

## 2016-04-09 MED ORDER — SENNOSIDES-DOCUSATE SODIUM 8.6-50 MG PO TABS
1.0000 | ORAL_TABLET | Freq: Two times a day (BID) | ORAL | Status: DC
  Administered 2016-04-09 – 2016-04-10 (×3): 1 via ORAL
  Filled 2016-04-09 (×3): qty 1

## 2016-04-09 MED ORDER — INFLUENZA VAC SPLIT QUAD 0.5 ML IM SUSY
0.5000 mL | PREFILLED_SYRINGE | INTRAMUSCULAR | Status: AC
  Administered 2016-04-10: 0.5 mL via INTRAMUSCULAR
  Filled 2016-04-09: qty 0.5

## 2016-04-09 MED ORDER — POLYETHYLENE GLYCOL 3350 17 G PO PACK
17.0000 g | PACK | Freq: Every day | ORAL | Status: DC
  Administered 2016-04-09 – 2016-04-10 (×2): 17 g via ORAL
  Filled 2016-04-09 (×2): qty 1

## 2016-04-09 NOTE — Progress Notes (Signed)
[facility] Progress Note  Patient: [name] [name] EXB:[id number]   PCP: Tillman Abideichard Letvak, MD DOB: 09/11/1926   DOA: 04/07/2016   DOS: 04/09/2016   Date of Service: the patient was seen and examined on 04/09/2016  Brief hospital course: Pt. with PMH of Advanced dementia, HTN; admitted on 04/07/2016, with complaint of recurrent fall, was found to have ataxia. Currently further plan is monitor closely.  Assessment and Plan: 1. Ataxia. Acute encephalopathy. CT head is unremarkable. MRI brain is very poor study, but no evidence of acute stroke. We'll continue serial neuro checks at present. PTOT consult recommends SNF B-12 normal. Recheck orthostatic vitals. Patient shows somewhat improvement in mentation  2. Dementia:  Patient requires 24 hour supervision as well as assistance with basic self cares.   Consult to SW and CM  3. HTN:  Has been off meds for a while.  4. Mood disorder:  Continue citalopram  5. UTI:  Continue levaquin to complete the course.  6. Constipation. Add MiraLAX and Senokot scheduled.  Pain management: When necessary Tylenol Activity: SNF as per physical therapy Bowel regimen: last BM prior to admission Diet: Mechanical soft per home regimen DVT Prophylaxis: subcutaneous Heparin  Advance goals of care discussion: Full code  Family Communication: no family was present at bedside, at the time of interview.  Disposition:  Discharge to SNF. Expected discharge date: 04/10/2016, improvement in mentation  Consultants: None Procedures: None  Antibiotics: Anti-infectives    Start     Dose/Rate Route Frequency Ordered Stop   04/07/16 2215  levofloxacin (LEVAQUIN) tablet 500 mg     500 mg Oral Every evening 04/07/16 2212 04/08/16 1649     Subjective: Mentation is improved, patient is able to follow command. Denies any acute complaint.  Objective: Physical Exam: Vitals:   04/08/16 1411 04/08/16 2049 04/09/16 0533 04/09/16 1459  BP: 116/67  (!) 124/50 118/62 101/63  Pulse: (!) 55 66 (!) 52 66  Resp: 17 18 19 17   Temp: 99 F (37.2 C) 98.2 F (36.8 C)  98.4 F (36.9 C)  TempSrc:    Oral  SpO2: 100% 95% 96% 98%  Weight:      Height:        Intake/Output Summary (Last 24 hours) at 04/09/16 1541 Last data filed at 04/09/16 1228  Gross per 24 hour  Intake                0 ml  Output              700 ml  Net             -700 ml   Filed Weights   04/08/16 0100  Weight: 94.8 kg (209 lb)    General: Alert, Awake and Oriented to Self, unable to follow any command. Appear in mild distress, affect appropriate Eyes: PERRL, Conjunctiva normal ENT: Oral Mucosa clear moist. Neck: difficult to assess JVD, no Abnormal Mass Or lumps Cardiovascular: S1 and S2 Present, no Murmur, Respiratory: Bilateral Air entry equal and Decreased, no use of accessory muscle, Clear to Auscultation, no Crackles, no wheezes Abdomen: Bowel Sound present, Soft and no tenderness Skin: no redness, no Rash, no induration Extremities: no Pedal edema, no calf tenderness Neurologic: Grossly no focal neuro deficit. Bilaterally Equal motor strength  Data Reviewed: CBC:  Recent Labs Lab 04/07/16 1547 04/09/16 0538  WBC 7.0 4.9  NEUTROABS 4.7  --   HGB 14.0 13.0  HCT 40.6 39.4  MCV 97.4 100.3*  PLT 234  203   Basic Metabolic Panel:  Recent Labs Lab 04/07/16 1547 04/09/16 0538  NA 141 140  K 3.9 3.7  CL 112* 112*  CO2 21* 24  GLUCOSE 111* 94  BUN 18 13  CREATININE 0.97 0.92  CALCIUM 9.3 9.4  MG  --  2.4    Liver Function Tests:  Recent Labs Lab 04/07/16 1547  AST 37  ALT 24  ALKPHOS 62  BILITOT 0.9  PROT 6.2*  ALBUMIN 3.8   No results for input(s): LIPASE, AMYLASE in the last 168 hours. No results for input(s): AMMONIA in the last 168 hours. Coagulation Profile: No results for input(s): INR, PROTIME in the last 168 hours. Cardiac Enzymes: No results for input(s): CKTOTAL, CKMB, CKMBINDEX, TROPONINI in the last 168  hours. BNP (last 3 results) No results for input(s): PROBNP in the last 8760 hours.  CBG: No results for input(s): GLUCAP in the last 168 hours.  Studies: No results found.   Scheduled Meds: . citalopram  10 mg Oral Daily  . enoxaparin (LOVENOX) injection  40 mg Subcutaneous Q24H  . [START ON 04/10/2016] Influenza vac split quadrivalent PF  0.5 mL Intramuscular Tomorrow-1000  . vitamin B-12  1,000 mcg Oral Daily   Continuous Infusions:  PRN Meds: acetaminophen **OR** acetaminophen, ondansetron **OR** ondansetron (ZOFRAN) IV  Time spent: 30 minutes  Author: Lynden OxfordPranav Kharisma Glasner, MD [facility] Pager: [phone] 04/09/2016 3:41 PM  If 7PM-7AM, please contact night-coverage at [email or web address], password [email or web address]

## 2016-04-09 NOTE — Clinical Social Work Placement (Signed)
   CLINICAL SOCIAL WORK PLACEMENT  NOTE  Date:  04/09/2016  Patient Details  Name: [name] [name] MRN: [id number] Date of Birth: 10/12/1926  Clinical Social Work is seeking post-discharge placement for this patient at the   level of care (*CSW will initial, date and re-position this form in  chart as items are completed):      Patient/family provided with [facility] Clinical Social Work Department's list of facilities offering this level of care within the geographic area requested by the patient (or if unable, by the patient's family).      Patient/family informed of their freedom to choose among providers that offer the needed level of care, that participate in Medicare, Medicaid or managed care program needed by the patient, have an available bed and are willing to accept the patient.      Patient/family informed of [facility] ownership interest in [facility] and [facility], as well as of the fact that they are under no obligation to receive care at these facilities.  PASRR submitted to EDS on     04/09/2016      PASRR number received on       Existing PASRR number confirmed on       FL2 transmitted to all facilities in geographic area requested by pt/family on     04/09/2016  FL2 transmitted to all facilities within larger geographic area on       Patient informed that his/her managed care company has contracts with or will negotiate with certain facilities, including the following:            Patient/family informed of bed offers received.  Patient chooses bed at       Physician recommends and patient chooses bed at      Patient to be transferred to   on  .  Patient to be transferred to facility by       Patient family notified on   of transfer.  Name of family member notified:        PHYSICIAN       Additional Comment:    _______________________________________________ Norlene DuelBROWN, Darrious Youman B, LCSWA 04/09/2016, 4:27 PM

## 2016-04-09 NOTE — NC FL2 (Signed)
[place] MEDICAID FL2 LEVEL OF CARE SCREENING TOOL     IDENTIFICATION  Patient Name: [name] [name] Birthdate: 01/10/1927 Sex: male Admission Date (Current Location): 04/07/2016  [facility] [place]   Facility and Address:  [facility] [place], [address], [place], [place] [address]      Provider Number: [id number]  Attending Physician Name and Address:  Rolly SalterPranav M Patel, MD  Relative Name and Phone Number:  Raj Januseggy Cisse, Spouse, [phone]    Current Level of Care: Hospital Recommended Level of Care: Skilled Nursing Facility Prior Approval Number:    Date Approved/Denied:   PASRR Number:    Discharge Plan: SNF    Current Diagnoses: Patient Active Problem List   Diagnosis Date Noted  . Fall 04/07/2016  . Ataxia 04/07/2016  . Acute cystitis without hematuria 04/07/2016  . Acute encephalopathy 04/01/2016  . Malnutrition of mild degree (HCC) 12/30/2015  . Counseling regarding advanced directives 01/15/2014  . Diarrhea 12/30/2013  . Delusions (HCC) 10/02/2013  . UTI (urinary tract infection) 07/15/2011  . Episodic mood disorder (HCC) 12/09/2009  . B12 deficiency 11/23/2008  . Alzheimer's dementia without behavioral disturbance 09/25/2008  . Essential hypertension, benign 09/19/2006  . VENOUS INSUFFICIENCY 09/19/2006  . Osteoarthritis, multiple sites 09/19/2006  . DIVERTICULOSIS, COLON, HX OF 09/19/2006  . GERD 03/01/2005    Orientation RESPIRATION BLADDER Height & Weight        Normal Incontinent Weight: 209 lb (94.8 kg) Height:  5\' 9"  (175.3 cm)  BEHAVIORAL SYMPTOMS/MOOD NEUROLOGICAL BOWEL NUTRITION STATUS      Incontinent    AMBULATORY STATUS COMMUNICATION OF NEEDS Skin   Limited Assist Verbally Normal                       Personal Care Assistance Level of Assistance  Bathing Bathing Assistance: Limited assistance         Functional Limitations Info             SPECIAL CARE FACTORS FREQUENCY                        Contractures Contractures Info: Not present    Additional Factors Info                  Current Medications (04/09/2016):  This is the current hospital active medication list Current Facility-Administered Medications  Medication Dose Route Frequency Provider Last Rate Last Dose  . acetaminophen (TYLENOL) tablet 650 mg  650 mg Oral Q6H PRN Alberteen Samhristopher P Danford, MD       Or  . acetaminophen (TYLENOL) suppository 650 mg  650 mg Rectal Q6H PRN Alberteen Samhristopher P Danford, MD      . citalopram (CELEXA) tablet 10 mg  10 mg Oral Daily Alberteen Samhristopher P Danford, MD   10 mg at 04/09/16 0957  . enoxaparin (LOVENOX) injection 40 mg  40 mg Subcutaneous Q24H Alberteen Samhristopher P Danford, MD   40 mg at 04/09/16 0957  . [START ON 04/10/2016] Influenza vac split quadrivalent PF (FLUARIX) injection 0.5 mL  0.5 mL Intramuscular Tomorrow-1000 Rolly SalterPranav M Patel, MD      . ondansetron [facility](ZOFRAN) tablet 4 mg  4 mg Oral Q6H PRN Alberteen Samhristopher P Danford, MD       Or  . ondansetron (ZOFRAN) injection 4 mg  4 mg Intravenous Q6H PRN Alberteen Samhristopher P Danford, MD      . vitamin B-12 (CYANOCOBALAMIN) tablet 1,000 mcg  1,000 mcg Oral  Daily Alberteen Samhristopher P Danford, MD   1,000 mcg at 04/09/16 [id number]     Discharge Medications: Please see discharge summary for a list of discharge medications.  Relevant Imaging Results:  Relevant Lab Results:   Additional Information    Brandy Zuba B, LCSWA

## 2016-04-09 NOTE — Clinical Social Work Note (Signed)
Clinical Social Work Assessment  Patient Details  Name: [name] [name] MRN: [id number] Date of Birth: 12-01-1926  Date of referral:  04/09/16               Reason for consult:  Facility Placement, Discharge Planning                Permission sought to share information with:  Facility Sport and exercise psychologist Permission granted to share information::  Yes, Verbal Permission Granted  Name::      (SNF's)  Agency::     Relationship::  Spouse, [name] [name] [phone]  Contact Information:     Housing/Transportation Living arrangements for the past 2 months:  Single Family Home Source of Information:  Spouse, Adult Children Patient Interpreter Needed:  None Criminal Activity/Legal Involvement Pertinent to Current Situation/Hospitalization:  No - Comment as needed Significant Relationships:  Spouse, Adult Children Lives with:  Spouse Do you feel safe going back to the place where you live?  No Need for family participation in patient care:  Yes (Comment)  Care giving concerns:  Physical therapy recommends SNF/24 hr care    Social Worker assessment / plan:  CSW met with pt/family (Spouse, and Son) @ bedside. Pt alert, but not oriented to place/date/time. The family reported to [place] that spouse cannot care for pt at home and pt was recently DC from [facility] last week but had a fall in the home post DC. Spouse reports that she has a caregiver come in for 2 hrs daily but this is not enough. Spouse is hoping pt becomes stronger so that he can return home. Spouse requestingSNF placement in [place] so that she will not have to drive far. CSW has faxed out and is awaiting acceptance. The family understands that pt's insurance will not likely cover stay and that it may be an out of pocket expense. The family reported to [place] that pt will not have coverage until Nov 16th, CSW has not verified this information. CSW will continue to be available for pt/family as needed.  Employment status:   Retired Forensic scientist:  Medicare PT Recommendations:  [place], [place] / Referral to community resources:     Patient/Family's Response to care:  The family praised [name] [name] and reports that they are very happy with care and staff to date.  Patient/Family's Understanding of and Emotional Response to Diagnosis, Current Treatment, and Prognosis:  The family understands pt's dx of Alhz. And remains supportive in trying to help pt get as close to baseline as possible.   Emotional Assessment Appearance:  Appears stated age Attitude/Demeanor/Rapport:  Other (Cooperative) Affect (typically observed):  Flat Orientation:  Fluctuating Orientation (Suspected and/or reported Sundowners) Alcohol / Substance use:  Never Used Psych involvement (Current and /or in the community):  No (Comment)  Discharge Needs  Concerns to be addressed:    Readmission within the last 30 days:  Yes (Pt discharged last week from [facility]) Current discharge risk:  Cognitively Impaired Barriers to Discharge:  [place] Tour manager), Continued Medical Work up   [place], [place], [place] 04/09/2016, 3:14 PM

## 2016-04-10 LAB — BASIC METABOLIC PANEL
Anion gap: 6 (ref 5–15)
BUN: 13 mg/dL (ref 6–20)
CALCIUM: 9.1 mg/dL (ref 8.9–10.3)
CHLORIDE: 111 mmol/L (ref 101–111)
CO2: 22 mmol/L (ref 22–32)
CREATININE: 0.88 mg/dL (ref 0.61–1.24)
GFR calc Af Amer: >60 mL/min (ref >60)
GFR calc non Af Amer: >60 mL/min (ref >60)
GLUCOSE: 90 mg/dL (ref 65–99)
Potassium: 3.5 mmol/L (ref 3.5–5.1)
Sodium: 139 mmol/L (ref 135–145)

## 2016-04-10 MED ORDER — POLYETHYLENE GLYCOL 3350 17 G PO PACK: 17.0000 g | PACK | Freq: Every day | ORAL | 0 refills | Status: AC

## 2016-04-10 MED ORDER — SENNOSIDES-DOCUSATE SODIUM 8.6-50 MG PO TABS: 1.0000 | ORAL_TABLET | Freq: Two times a day (BID) | ORAL | 0 refills | Status: AC

## 2016-04-10 NOTE — Consult Note (Signed)
   [facility] CM Inpatient Consult   04/10/2016  [name] [name] 03-01-1927 [id number]   Referral received to assess for care management services.  Met with the patient's wife and son regarding the benefits of [facility] Care Management services.  Explained that [facility] is a covered benefit of Medicare insurance at no additional cost,  and confirmed that the primary care provider is Dr. Viviana Simpler. Review information for [facility] Care Management and a folder was provided with contact information.  Explained that [facility] does not interfere with or replace any services arranged by the inpatient care management staff.  Patient's wife declined services with [facility] stating she wanted to see what the home health care could provide. She did accept the brochure and contact information.  Updated the inpatient RNCM of the family's decision. Wife states the patient's has minimal medications, and they have an appointment for Medicaid on April 28, 2016, to attempt another time for qualifying.  No further needs expressed.  For questions, please contact:  Natividad Brood, RN BSN [facility] Liaison  [phone] business mobile phone Toll free office [phone]

## 2016-04-10 NOTE — Care Management Note (Signed)
Case Management Note  Patient Details  Name: [name] [name] MRN: [id number] Date of Birth: 06/23/1926  Subjective/Objective:                    Action/Plan: Plan is to d/c to home with home health services. Arrangements made with [facility]([phone]) for transportation to home. Address confirmed with wife Peggy @ [phone] by CM.   Expected Discharge Date:    04/10/2016            Expected Discharge Plan:     In-House Referral:  Clinical Social Work  Discharge planning Services     Post Acute Care Choice:    Choice offered to:  Spouse  DME Arranged:    DME Agency:     HH Arranged:   RN,PT, NA,SW HH Agency:   [facility], resumption order of care will be needed @ d/c .   Status of Service:  COMPLETED  If discussed at Long Length of Stay Meetings, dates discussed:    Additional Comments: CM spoke with Donna([phone]) @ [facility] to resume home health services (RN,PT,SW), and added NA per MD's order.  Gae GallopCole, Tityana Pagan [place], RN 04/10/2016, 12:21 PM

## 2016-04-10 NOTE — Progress Notes (Signed)
[facility]  Patient Status: Active (receiving services up to time of hospitalization)  [facility] is providing the following services: RN, PT and MSW  Referred for services but readmitted to Hospital prior to start of care.  If patient discharges after hours, please call [phone].   [name] FurnishDonna [name] 04/10/2016, 11:30 AM

## 2016-04-10 NOTE — Progress Notes (Signed)
Pt prepared for d/c to home with wifef. IV d/c'd. Skin intact except as charted in most recent assessments. Vitals are stable.. Pt to be transported by [facility]. Family, casey, has been made aware that patient will be arriving soon. All questions answered

## 2016-04-10 NOTE — Telephone Encounter (Signed)
Back in the hospital Not sure he will be coming on Thursday

## 2016-04-10 NOTE — Progress Notes (Signed)
CSW spoke with patient's wife on the phone. She is headed to the hospital. CSW explained that patient does not meet medicare three night stay to pay for snf. Patient's wife stated that she cannot afford to pay privately since the last memory care facility patient was in took all her money. Patient's spouse would like home health. CSW will provide ALF list as well. She mentioned that she may not have transportation home for today. RNCM consulted.  CSW signing off.   Osborne Cascoadia Alivea Gladson LCSWA [phone]

## 2016-04-11 ENCOUNTER — Telehealth: Payer: Self-pay

## 2016-04-11 NOTE — Telephone Encounter (Signed)
Information provided by patients wife Peggy.    Transition Care Management Follow-up Telephone Call   Date discharged? 04/10/16   How have you been since you were released from the hospital? "I couldn't get him out of the bed because he couldn't stand." "he hasn't gotten up all day"   Do you understand why you were in the hospital? yes   Do you understand the discharge instructions? yes   Where were you discharged to? Home   Items Reviewed:  Medications reviewed: yes  Allergies reviewed: yes  Dietary changes reviewed: yes, pt on soft diet. Will chew food, small bites.   Referrals reviewed: yes, no new referrals. Home Health RN to visit today for evaluation.    Functional Questionnaire:   Activities of Daily Living (ADLs):   He states they are independent in the following: none.  States they require assistance with the following: All ADL's.   Any transportation issues/concerns?: yes, Pt unable to get out of bed. Will need assistance with transportation for appointments. Wife requesting home visit if possible.    Any patient concerns? yes, Wife states pt is unable to complete ADL's independently. Prior to hospitalization for UTI, patient was able to walk (with assistance), feed and toilet on his own.    Confirmed importance and date/time of follow-up visits scheduled yes, Gigi Gineggy is calling back after today's home nurse visit to make f/u appt. She is requesting a home visit from Dr. Alphonsus SiasLetvak if possible. Clinic number provided.     Confirmed with patient if condition begins to worsen call PCP or go to the ER.  Patient was given the office number and encouraged to call back with question or concerns.  : yes

## 2016-04-11 NOTE — Discharge Summary (Signed)
[facility] Discharge Summary   Patient: [name] [name] ZOX:[id number]   PCP: Tillman Abide, MD DOB: 12/23/1926   Date of admission: 04/07/2016   Date of discharge: 04/10/2016     Discharge Diagnoses:  Principal Problem:   Ataxia Active Problems:   B12 deficiency   Episodic mood disorder (HCC)   Alzheimer's dementia without behavioral disturbance   Essential hypertension, benign   Fall   Acute cystitis without hematuria   Admitted From: home Disposition:  home  Recommendations for Outpatient Follow-up:  1. Please follow up with PCP   Follow-up Information    Tillman Abide, MD. Schedule an appointment as soon as possible for a visit in 1 week(s).   Specialties:  Internal Medicine, Pediatrics Contact information: [address] [place] [address] [phone]          Diet recommendation: soft diet  Activity: The patient is advised to gradually reintroduce usual activities.  Discharge Condition: fair  Code Status: full code  History of present illness: As per the H and P dictated on admission, "[name] [name] is a 82 y.o. male with a past medical history significant for advanced dementia and HTN who presents with falls and inability to stand or walk.  The patient was in private pay nursing home until this past June, when the family decided to take the patient out of SNF for financial reasons.  He was ambulating independently and able to feed himself and toilet himself, just had advanced memory loss and so it seemed this would be a reasonable arrangement.    Wife explains that since bringing her husband home, he mostly doesn't know who she is, and he is unable to participate in a conversation, but he will try and he was ambulatory and somewhat functional, so they were able to care for him home alone with his wife since they had a caregiver for 2 hours a day and family help for several months.  His PCP attempted to arrange for Hospice care, but  because he was able to feed himself he did not qualify for Hospice.    However, within the last few weeks, he has had a decline, seemed more unsteady on his feet, and fell several times. One week ago, he wouldn't stand, and was not responding well to questions so he was brought to [facility] where he was diagnosed with acute encephalopathy from UTI and dehydration.  He was there in OBS status for 4 days, treated for UTI, got fluids, got PT (who recommended SNF or memory care unit) and was discharged to home last Tuesday.  At first after discharge, he was taking baby shuffling steps, but today he was extremely unsteady, and then fell, striking his head on the left side.  Afterwards he could not stand, and so family brought him to the ER."  Hospital Course:   Summary of his active problems in the hospital is as following. 1. Ataxia. Acute encephalopathy. CT head is unremarkable. MRI brain is very poor study, but no evidence of acute stroke. PTOT consult recommends SNF B-12 normal. Patient shows somewhat improvement in mentation  2. Dementia: Consult to SW and CM Unfortunately they were not able to offer much assistance, pt discharged home with social worker support.  3. HTN: Has been off meds for a while.  4. Mood disorder: Continue citalopram  5. UTI: levaquin complete the course.  6. Constipation. Add MiraLAX and Senokot scheduled.  All other chronic medical condition were stable during the hospitalization.  Patient was seen by physical therapy, who recommended SNF, family was unable to arrange for SNF and social worker had no further assistance help,home health which was arranged by Child psychotherapistsocial worker and case Production designer, theatre/television/filmmanager. On the day of the discharge the patient's vitals were stable, and no other acute medical condition were reported by patient. the patient was felt safe to be discharge at home with home health.  Procedures and Results:  none   Consultations:  none  DISCHARGE  MEDICATION: Discharge Medication List as of 04/10/2016  8:26 PM    START taking these medications   Details  polyethylene glycol (MIRALAX / GLYCOLAX) packet Take 17 g by mouth daily., Starting Tue 04/11/2016, Normal    senna-docusate (SENOKOT-S) 8.6-50 MG tablet Take 1 tablet by mouth 2 (two) times daily., Starting Mon 04/10/2016, Normal      CONTINUE these medications which have NOT CHANGED   Details  acetaminophen (MAPAP ARTHRITIS PAIN) 650 MG CR tablet TAKE ONE TABLET BY MOUTH 3 TIMES A DAY *DO NOT CRUSH*, Normal    citalopram (CELEXA) 10 MG tablet Take 1 tablet (10 mg total) by mouth daily., Starting Tue 01/04/2016, Normal    guaifenesin (ROBITUSSIN) 100 MG/5ML syrup Take 200 mg by mouth every 6 (six) hours as needed for cough., Historical Med    vitamin B-12 (CYANOCOBALAMIN) 1000 MCG tablet Take 1 tablet (1,000 mcg total) by mouth daily., Starting Mon 12/13/2015, Normal      STOP taking these medications     levofloxacin (LEVAQUIN) 500 MG tablet        Allergies  Allergen Reactions  . Codeine Nausea And Vomiting   Discharge Instructions    Diet - low sodium heart healthy    Complete by:  As directed    Increase activity slowly    Complete by:  As directed      Discharge Exam: Filed Weights   04/08/16 0100  Weight: 94.8 kg (209 lb)   Vitals:   04/10/16 0652 04/10/16 2033  BP: 107/63 (!) 148/65  Pulse: (!) 57 (!) 59  Resp: 16 17  Temp: 97.2 F (36.2 C) (!) 96.6 F (35.9 C)   General: Appear in no distress, no Rash; Oral Mucosa moist. Cardiovascular: S1 and S2 Present, no Murmur, no JVD Respiratory: Bilateral Air entry present and Clear to Auscultation, no Crackles, no wheezes Abdomen: Bowel Sound present, Soft and no tenderness Extremities: no Pedal edema, no calf tenderness Neurology: Grossly no focal neuro deficit.  The results of significant diagnostics from this hospitalization (including imaging, microbiology, ancillary and laboratory) are listed below  for reference.    Significant Diagnostic Studies: Dg Chest 2 View  Result Date: 04/07/2016 CLINICAL DATA:  Fall. EXAM: CHEST  2 VIEW COMPARISON:  04/01/2016 FINDINGS: Mediastinum and hilar structures are stable. Low lung volumes. Interposition of the colon under the hemidiaphragms again noted. Stable mild cardiomegaly. No pulmonary venous congestion. No pleural effusion or pneumothorax. No acute bony abnormality. IMPRESSION: 1. Stable mild cardiomegaly. 2. Low lung volumes. Interposition of the colon hemidiaphragms again noted. No acute abnormality identified. Electronically Signed   By: Maisie Fushomas  Register   On: 04/07/2016 17:30   Ct Head Wo Contrast  Result Date: 04/07/2016 CLINICAL DATA:  Confusion, multiple falls.  Unsteady gait EXAM: CT HEAD WITHOUT CONTRAST TECHNIQUE: Contiguous axial images were obtained from the base of the skull through the vertex without intravenous contrast. COMPARISON:  04/01/2016 FINDINGS: Brain: No acute territorial infarction or intracranial hemorrhage. No new mass, mass effect or midline shift. Moderate  atrophy. Moderate-to-marked periventricular white matter small vessel ischemic disease. 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 Stable slightly enlarged ventricles likely related to atrophy. Vascular: No hyperdense vessels. Calcifications present within the carotid arteries at the skullbase. Skull: Mastoid air cells clear. There is no skull fracture. Mild bony thickening of the walls of the sphenoid sinus, also unchanged. Sinuses/Orbits: Moderate mucosal thickening in the ethmoid and maxillary sinuses with polyp or mucous retention cyst in the right maxillary sinus. No acute orbital abnormality. Other: None IMPRESSION: 1. No CT evidence for acute intracranial abnormality. 2. Extensive periventricular white matter small vessel ischemic disease. 3. 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   On: 04/07/2016 22:41   Ct Head Wo Contrast  Result Date: 04/01/2016 CLINICAL DATA:  Weakness, less responsive EXAM: CT HEAD WITHOUT CONTRAST TECHNIQUE: Contiguous axial images were obtained from the base of the skull through the vertex without intravenous contrast. COMPARISON:  None. FINDINGS: Brain: No intracranial hemorrhage, mass effect or midline shift. Stable global atrophy. Stable extensive chronic white matter disease. Again noted cavum septum pellucidum. Stable midline tubular lipoma. No definite acute cortical infarction. Ventricular size is stable from prior exam. Vascular: No hyperdense vessel or unexpected calcification. Skull: There are motion artifacts.  No skull fracture is noted. Sinuses/Orbits: Paranasal sinuses and mastoid air cells are unremarkable. Other: None IMPRESSION: No acute intracranial abnormality. Stable atrophy and extensive chronic white matter disease. No definite acute cortical infarction. Stable chronic findings as described above. Electronically Signed   By: Natasha Mead M.D.   On: 04/01/2016 20:06   Mr Brain Wo Contrast  Result Date: 04/08/2016 CLINICAL DATA:  Ataxia. EXAM: MRI HEAD WITHOUT CONTRAST TECHNIQUE: Multiplanar, multiecho pulse sequences of the brain and surrounding structures were obtained without intravenous contrast. COMPARISON:  CT head 04/07/2016 FINDINGS: Limited study. The patient was not able to complete the study. There is considerable motion on the images obtained Brain: Moderate to advanced atrophy. Negative for acute infarct. Moderate chronic microvascular ischemic change. No mass or shift of the midline structures Paranasal sinuses grossly normal. IMPRESSION: Incomplete study degraded by motion Moderate to advanced atrophy. Chronic microvascular ischemic change in the white matter. No acute infarct. Electronically Signed   By: Marlan Palau M.D.   On: 04/08/2016 12:28   Dg Chest Port 1 View  Result Date: 04/01/2016 CLINICAL DATA:   Confusion and difficulty ambulating.  Former smoker. EXAM: PORTABLE CHEST 1 VIEW COMPARISON:  07/15/2011 FINDINGS: Emphysematous hyperinflation of the lungs, upper lobe predominant with minimal crowding of lower lobe interstitial lung markings. There is atelectasis at the left lung base versus scarring. Eventration of the right hemidiaphragm is stable with colonic interposition over the liver shadow. The heart is normal in size. The aorta is slightly uncoiled without aneurysm. Atheromatous calcifications are seen of the aortic arch and descending aorta. No acute osseous abnormality. IMPRESSION: COPD without acute pulmonary disease. Electronically Signed   By: Tollie Eth M.D.   On: 04/01/2016 19:47    Microbiology: No results found for this or any previous visit (from the past 240 hour(s)).   Labs: CBC:  Recent Labs Lab 04/07/16 1547 04/09/16 0538  WBC 7.0 4.9  NEUTROABS 4.7  --   HGB 14.0 13.0  HCT 40.6 39.4  MCV 97.4 100.3*  PLT 234 203   Basic Metabolic Panel:  Recent Labs Lab 04/07/16 1547 04/09/16 0538 04/10/16 0634  NA 141 140 139  K 3.9 3.7 3.5  CL 112* 112* 111  CO2 21* 24 22  GLUCOSE 111* 94 90  BUN 18 13 13   CREATININE 0.97 0.92 0.88  CALCIUM 9.3 9.4 9.1  MG  --  2.4  --    Liver Function Tests:  Recent Labs Lab 04/07/16 1547  AST 37  ALT 24  ALKPHOS 62  BILITOT 0.9  PROT 6.2*  ALBUMIN 3.8   Time spent: 30 minutes  Signed:  Somya Jauregui  [facility] 04/10/2016 , 10:35 PM

## 2016-04-12 ENCOUNTER — Telehealth: Payer: Self-pay | Admitting: Internal Medicine

## 2016-04-12 DIAGNOSIS — M6281 Muscle weakness (generalized): Secondary | ICD-10-CM | POA: Diagnosis not present

## 2016-04-12 DIAGNOSIS — F0280 Dementia in other diseases classified elsewhere without behavioral disturbance: Secondary | ICD-10-CM | POA: Diagnosis not present

## 2016-04-12 DIAGNOSIS — M1991 Primary osteoarthritis, unspecified site: Secondary | ICD-10-CM | POA: Diagnosis not present

## 2016-04-12 DIAGNOSIS — F419 Anxiety disorder, unspecified: Secondary | ICD-10-CM | POA: Diagnosis not present

## 2016-04-12 DIAGNOSIS — G309 Alzheimer's disease, unspecified: Secondary | ICD-10-CM | POA: Diagnosis not present

## 2016-04-12 DIAGNOSIS — K219 Gastro-esophageal reflux disease without esophagitis: Secondary | ICD-10-CM | POA: Diagnosis not present

## 2016-04-12 DIAGNOSIS — Z87891 Personal history of nicotine dependence: Secondary | ICD-10-CM | POA: Diagnosis not present

## 2016-04-12 DIAGNOSIS — Z87440 Personal history of urinary (tract) infections: Secondary | ICD-10-CM | POA: Diagnosis not present

## 2016-04-12 DIAGNOSIS — I872 Venous insufficiency (chronic) (peripheral): Secondary | ICD-10-CM | POA: Diagnosis not present

## 2016-04-12 NOTE — Telephone Encounter (Signed)
Noted, yes if weak, he needs to go to ER for further evaluation

## 2016-04-12 NOTE — Telephone Encounter (Signed)
I spoke with Mrs [name] [name] and [facility] pt [facility] and if needed will take pt to ED or call [facility] back; pt did not lose consciousness when fell out of bed. FYI to Pamala Hurry Baity NP. Dr Alphonsus SiasLetvak out of office.

## 2016-04-12 NOTE — Telephone Encounter (Signed)
Patient Name: Morrie SheldonWESLEY Arlotta DOB: 10/07/1926 Initial Comment [facility] he is weak and he is not walking. Can't get him to stand up, this morning at 3:30 he fell out of his bed and hit his head and under his eye. Doesn't know what to do. Nurse Assessment Nurse: Yetta BarreJones, RN, Miranda Date/Time (Eastern Time): 04/12/2016 12:52:33 PM Confirm and document reason for call. If symptomatic, describe symptoms. You must click the next button to save text entered. ---Caller states her husband fell out of the bed this morning at 330. He hit his head and just under his eye. They called 911 and the fire department came to help him up, they said it was up to her to take him to the ED. She states he is still very weak and not able to get out of bed. They have home health but they have not seen him today. Has the patient traveled out of the country within the last 30 days? ---Not Applicable Does the patient have any new or worsening symptoms? ---Yes Will a triage be completed? ---Yes Related visit to physician within the last 2 weeks? ---Yes Does the PT have any chronic conditions? (i.e. diabetes, asthma, etc.) ---Yes List chronic conditions. ---Alzheimer's Is this a behavioral health or substance abuse call? ---No Nurse: Yetta BarreJones, RN, Miranda Date/Time (Eastern Time): 04/12/2016 1:06:39 PM Has the patient traveled out of the country within the last 30 days? ---Not Applicable Does the patient have any new or worsening symptoms? ---No Guidelines Guideline Title Affirmed Question Affirmed Notes Head Injury Scalp swelling, bruise or pain (all triage questions negative) Final Disposition User Home Care Yetta BarreJones, RN, Miranda Comments Pt has home health, Strongly encouraged caller to contact them and ask for a nurse to come out and evaluate the pt. He is incontinent and caller is having trouble changing his diaper and is alone with him during the day and at night. Told  caller that if she has trouble getting home health to come out, to call back so that the MD office can see if they can step in to assist her. Disagree/Comply: Comply

## 2016-04-13 ENCOUNTER — Encounter (HOSPITAL_COMMUNITY): Payer: Self-pay | Admitting: Emergency Medicine

## 2016-04-13 ENCOUNTER — Observation Stay (HOSPITAL_COMMUNITY): Payer: Medicare Other

## 2016-04-13 ENCOUNTER — Ambulatory Visit: Payer: Medicare Other | Admitting: Internal Medicine

## 2016-04-13 ENCOUNTER — Inpatient Hospital Stay (HOSPITAL_COMMUNITY)
Admission: EM | Admit: 2016-04-13 | Discharge: 2016-04-17 | DRG: 312 | Disposition: A | Discharge status: Skilled Nursing Facility | Payer: Medicare Other | Attending: Family Medicine | Admitting: Family Medicine

## 2016-04-13 ENCOUNTER — Emergency Department (HOSPITAL_COMMUNITY): Payer: Medicare Other

## 2016-04-13 DIAGNOSIS — R410 Disorientation, unspecified: Secondary | ICD-10-CM | POA: Diagnosis not present

## 2016-04-13 DIAGNOSIS — T68XXXA Hypothermia, initial encounter: Secondary | ICD-10-CM | POA: Diagnosis present

## 2016-04-13 DIAGNOSIS — F0280 Dementia in other diseases classified elsewhere without behavioral disturbance: Secondary | ICD-10-CM

## 2016-04-13 DIAGNOSIS — I959 Hypotension, unspecified: Secondary | ICD-10-CM | POA: Diagnosis present

## 2016-04-13 DIAGNOSIS — G309 Alzheimer's disease, unspecified: Secondary | ICD-10-CM | POA: Diagnosis present

## 2016-04-13 DIAGNOSIS — G9340 Encephalopathy, unspecified: Secondary | ICD-10-CM | POA: Diagnosis present

## 2016-04-13 DIAGNOSIS — E860 Dehydration: Secondary | ICD-10-CM | POA: Diagnosis not present

## 2016-04-13 DIAGNOSIS — R001 Bradycardia, unspecified: Secondary | ICD-10-CM | POA: Diagnosis not present

## 2016-04-13 DIAGNOSIS — Z79899 Other long term (current) drug therapy: Secondary | ICD-10-CM

## 2016-04-13 DIAGNOSIS — Z515 Encounter for palliative care: Secondary | ICD-10-CM | POA: Diagnosis not present

## 2016-04-13 DIAGNOSIS — G308 Other Alzheimer's disease: Secondary | ICD-10-CM | POA: Diagnosis not present

## 2016-04-13 DIAGNOSIS — R531 Weakness: Secondary | ICD-10-CM | POA: Diagnosis not present

## 2016-04-13 DIAGNOSIS — R638 Other symptoms and signs concerning food and fluid intake: Secondary | ICD-10-CM | POA: Diagnosis present

## 2016-04-13 DIAGNOSIS — R55 Syncope and collapse: Secondary | ICD-10-CM | POA: Diagnosis not present

## 2016-04-13 DIAGNOSIS — Z66 Do not resuscitate: Secondary | ICD-10-CM | POA: Diagnosis present

## 2016-04-13 DIAGNOSIS — Z87891 Personal history of nicotine dependence: Secondary | ICD-10-CM

## 2016-04-13 DIAGNOSIS — R680 Hypothermia, not associated with low environmental temperature: Secondary | ICD-10-CM | POA: Diagnosis present

## 2016-04-13 DIAGNOSIS — E876 Hypokalemia: Secondary | ICD-10-CM

## 2016-04-13 DIAGNOSIS — Z885 Allergy status to narcotic agent status: Secondary | ICD-10-CM

## 2016-04-13 DIAGNOSIS — I10 Essential (primary) hypertension: Secondary | ICD-10-CM

## 2016-04-13 DIAGNOSIS — K219 Gastro-esophageal reflux disease without esophagitis: Secondary | ICD-10-CM | POA: Diagnosis present

## 2016-04-13 DIAGNOSIS — R404 Transient alteration of awareness: Secondary | ICD-10-CM | POA: Diagnosis not present

## 2016-04-13 DIAGNOSIS — F0281 Dementia in other diseases classified elsewhere with behavioral disturbance: Secondary | ICD-10-CM | POA: Diagnosis present

## 2016-04-13 DIAGNOSIS — I119 Hypertensive heart disease without heart failure: Secondary | ICD-10-CM | POA: Diagnosis present

## 2016-04-13 DIAGNOSIS — D649 Anemia, unspecified: Secondary | ICD-10-CM | POA: Diagnosis present

## 2016-04-13 DIAGNOSIS — D509 Iron deficiency anemia, unspecified: Secondary | ICD-10-CM | POA: Diagnosis present

## 2016-04-13 HISTORY — DX: Syncope and collapse: R55

## 2016-04-13 HISTORY — DX: Hypokalemia: E87.6

## 2016-04-13 LAB — URINALYSIS, ROUTINE W REFLEX MICROSCOPIC
Glucose, UA: NEGATIVE mg/dL
Hgb urine dipstick: NEGATIVE
KETONES UR: NEGATIVE mg/dL
LEUKOCYTES UA: NEGATIVE
NITRITE: NEGATIVE
PH: 5.5 (ref 5.0–8.0)
Protein, ur: 30 mg/dL — AB
Specific Gravity, Urine: 1.025 (ref 1.005–1.030)

## 2016-04-13 LAB — COMPREHENSIVE METABOLIC PANEL
ALT: 18 U/L (ref 17–63)
ANION GAP: 6 (ref 5–15)
AST: 24 U/L (ref 15–41)
Albumin: 3.1 g/dL — ABNORMAL LOW (ref 3.5–5.0)
Alkaline Phosphatase: 56 U/L (ref 38–126)
BILIRUBIN TOTAL: 1.1 mg/dL (ref 0.3–1.2)
BUN: 15 mg/dL (ref 6–20)
CHLORIDE: 115 mmol/L — AB (ref 101–111)
CO2: 19 mmol/L — ABNORMAL LOW (ref 22–32)
Calcium: 8.2 mg/dL — ABNORMAL LOW (ref 8.9–10.3)
Creatinine, Ser: 0.91 mg/dL (ref 0.61–1.24)
Glucose, Bld: 129 mg/dL — ABNORMAL HIGH (ref 65–99)
POTASSIUM: 3.3 mmol/L — AB (ref 3.5–5.1)
Sodium: 140 mmol/L (ref 135–145)
TOTAL PROTEIN: 4.9 g/dL — AB (ref 6.5–8.1)

## 2016-04-13 LAB — I-STAT CHEM 8, ED
BUN: 17 mg/dL (ref 6–20)
CALCIUM ION: 1.12 mmol/L — AB (ref 1.15–1.40)
CHLORIDE: 111 mmol/L (ref 101–111)
Creatinine, Ser: 0.9 mg/dL (ref 0.61–1.24)
GLUCOSE: 124 mg/dL — AB (ref 65–99)
HCT: 34 % — ABNORMAL LOW (ref 39.0–52.0)
Hemoglobin: 11.6 g/dL — ABNORMAL LOW (ref 13.0–17.0)
POTASSIUM: 3.3 mmol/L — AB (ref 3.5–5.1)
SODIUM: 142 mmol/L (ref 135–145)
TCO2: 19 mmol/L (ref 0–100)

## 2016-04-13 LAB — URINE MICROSCOPIC-ADD ON

## 2016-04-13 LAB — I-STAT VENOUS BLOOD GAS, ED
Acid-base deficit: 6 mmol/L — ABNORMAL HIGH (ref 0.0–2.0)
Bicarbonate: 19.8 mmol/L — ABNORMAL LOW (ref 20.0–28.0)
O2 Saturation: 79 %
PCO2 VEN: 37.9 mmHg — AB (ref 44.0–60.0)
PH VEN: 7.326 (ref 7.250–7.430)
PO2 VEN: 46 mmHg — AB (ref 32.0–45.0)
TCO2: 21 mmol/L (ref 0–100)

## 2016-04-13 LAB — CBC WITH DIFFERENTIAL/PLATELET
BASOS ABS: 0 10*3/uL (ref 0.0–0.1)
Basophils Relative: 0 %
Eosinophils Absolute: 0.2 10*3/uL (ref 0.0–0.7)
Eosinophils Relative: 4 %
HCT: 37.1 % — ABNORMAL LOW (ref 39.0–52.0)
HEMOGLOBIN: 12.6 g/dL — AB (ref 13.0–17.0)
LYMPHS ABS: 1 10*3/uL (ref 0.7–4.0)
LYMPHS PCT: 18 %
MCH: 33.3 pg (ref 26.0–34.0)
MCHC: 34 g/dL (ref 30.0–36.0)
MCV: 98.1 fL (ref 78.0–100.0)
Monocytes Absolute: 0.3 10*3/uL (ref 0.1–1.0)
Monocytes Relative: 5 %
NEUTROS ABS: 4.3 10*3/uL (ref 1.7–7.7)
NEUTROS PCT: 73 %
PLATELETS: 151 10*3/uL (ref 150–400)
RBC: 3.78 MIL/uL — AB (ref 4.22–5.81)
RDW: 14.4 % (ref 11.5–15.5)
WBC: 5.8 10*3/uL (ref 4.0–10.5)

## 2016-04-13 LAB — CORTISOL: Cortisol, Plasma: 17.2 ug/dL

## 2016-04-13 LAB — TROPONIN I
TROPONIN I: 0.06 ng/mL — AB (ref <0.03)
TROPONIN I: 0.06 ng/mL — AB (ref <0.03)

## 2016-04-13 LAB — I-STAT CG4 LACTIC ACID, ED: Lactic Acid, Venous: 1.29 mmol/L (ref 0.5–1.9)

## 2016-04-13 LAB — I-STAT TROPONIN, ED: TROPONIN I, POC: 0.01 ng/mL (ref 0.00–0.08)

## 2016-04-13 LAB — TSH: TSH: 1.445 u[IU]/mL (ref 0.350–4.500)

## 2016-04-13 LAB — MRSA PCR SCREENING: MRSA BY PCR: NEGATIVE

## 2016-04-13 MED ORDER — SODIUM CHLORIDE 0.9% FLUSH
3.0000 mL | Freq: Two times a day (BID) | INTRAVENOUS | Status: DC
  Administered 2016-04-13 – 2016-04-17 (×6): 3 mL via INTRAVENOUS

## 2016-04-13 MED ORDER — ACETAMINOPHEN 325 MG PO TABS: 650.0000 mg | ORAL_TABLET | Freq: Four times a day (QID) | ORAL | Status: DC | PRN

## 2016-04-13 MED ORDER — POTASSIUM CHLORIDE IN NACL 20-0.45 MEQ/L-% IV SOLN
INTRAVENOUS | Status: AC
  Administered 2016-04-13: 17:00:00 via INTRAVENOUS
  Filled 2016-04-13: qty 1000

## 2016-04-13 MED ORDER — CITALOPRAM HYDROBROMIDE 10 MG PO TABS
10.0000 mg | ORAL_TABLET | Freq: Every day | ORAL | Status: DC
  Administered 2016-04-14 – 2016-04-17 (×4): 10 mg via ORAL
  Filled 2016-04-13 (×4): qty 1

## 2016-04-13 MED ORDER — ACETAMINOPHEN 325 MG PO TABS
650.0000 mg | ORAL_TABLET | Freq: Two times a day (BID) | ORAL | Status: DC
  Administered 2016-04-13 – 2016-04-17 (×8): 650 mg via ORAL
  Filled 2016-04-13 (×8): qty 2

## 2016-04-13 MED ORDER — ONDANSETRON HCL 4 MG PO TABS: 4.0000 mg | ORAL_TABLET | Freq: Four times a day (QID) | ORAL | Status: DC | PRN

## 2016-04-13 MED ORDER — ACETAMINOPHEN ER 650 MG PO TBCR: 650.0000 mg | EXTENDED_RELEASE_TABLET | Freq: Two times a day (BID) | ORAL | Status: DC

## 2016-04-13 MED ORDER — SODIUM CHLORIDE 0.9 % IV BOLUS (SEPSIS)
1000.0000 mL | Freq: Once | INTRAVENOUS | Status: AC
  Administered 2016-04-13: 1000 mL via INTRAVENOUS

## 2016-04-13 MED ORDER — ONDANSETRON HCL 4 MG/2ML IJ SOLN: 4.0000 mg | Freq: Four times a day (QID) | INTRAMUSCULAR | Status: DC | PRN

## 2016-04-13 MED ORDER — SENNOSIDES-DOCUSATE SODIUM 8.6-50 MG PO TABS
1.0000 | ORAL_TABLET | Freq: Two times a day (BID) | ORAL | Status: DC
  Administered 2016-04-13 – 2016-04-17 (×8): 1 via ORAL
  Filled 2016-04-13 (×8): qty 1

## 2016-04-13 MED ORDER — ENOXAPARIN SODIUM 40 MG/0.4ML ~~LOC~~ SOLN
40.0000 mg | SUBCUTANEOUS | Status: DC
  Administered 2016-04-13 – 2016-04-16 (×4): 40 mg via SUBCUTANEOUS
  Filled 2016-04-13 (×4): qty 0.4

## 2016-04-13 MED ORDER — ACETAMINOPHEN 650 MG RE SUPP: 650.0000 mg | Freq: Four times a day (QID) | RECTAL | Status: DC | PRN

## 2016-04-13 MED ORDER — VITAMIN B-12 1000 MCG PO TABS
1000.0000 ug | ORAL_TABLET | Freq: Every day | ORAL | Status: DC
  Administered 2016-04-14 – 2016-04-17 (×4): 1000 ug via ORAL
  Filled 2016-04-13 (×4): qty 1

## 2016-04-13 MED ORDER — GUAIFENESIN 100 MG/5ML PO SYRP: 200.0000 mg | ORAL_SOLUTION | Freq: Four times a day (QID) | ORAL | Status: DC | PRN

## 2016-04-13 NOTE — ED Provider Notes (Signed)
[facility]-EMERGENCY DEPT Provider Note   CSN: [id number] Arrival date & time: 04/13/16  1029     History   Chief Complaint Chief Complaint  Patient presents with  . Bradycardia  . Hypotension    HPI [name] [name] is a 82 y.o. male.  The history is provided by the EMS personnel. No language interpreter was used.   [name] DandyWesley O Srinivasan is a 82 y.o. male who presents to the Emergency Department complaining of Syncope. Patient with history of Alzheimer's disease here by EMS following a syncopal event. He's had multiple syncopal events lately. He was trying to urinate this morning when he passed out. On fire department arrival he was diaphoretic, pale and hypotensive. EMS reports the pressure down to 80 systolic with heart rate in the 30s. He was or on an epinephrine drip with improvement in his heart rate and blood pressure. Level 5 caveat due to confusion. Past Medical History:  Diagnosis Date  . Alzheimer disease   . ALZHEIMERS DISEASE 09/25/2008  . Anxiety   . ARTHRITIS 09/19/2006  . B12 DEFICIENCY 11/23/2008  . DIVERTICULOSIS, COLON, HX OF 09/19/2006  . GERD (gastroesophageal reflux disease)   . Hypertension   . HYPERTENSION 09/19/2006  . VENOUS INSUFFICIENCY 09/19/2006    Patient Active Problem List   Diagnosis Date Noted  . Fall 04/07/2016  . Ataxia 04/07/2016  . Acute cystitis without hematuria 04/07/2016  . Acute encephalopathy 04/01/2016  . Malnutrition of mild degree (HCC) 12/30/2015  . Counseling regarding advanced directives 01/15/2014  . Diarrhea 12/30/2013  . Delusions (HCC) 10/02/2013  . UTI (urinary tract infection) 07/15/2011  . Episodic mood disorder (HCC) 12/09/2009  . B12 deficiency 11/23/2008  . Alzheimer's dementia without behavioral disturbance 09/25/2008  . Essential hypertension, benign 09/19/2006  . VENOUS INSUFFICIENCY 09/19/2006  . Osteoarthritis, multiple sites 09/19/2006  . DIVERTICULOSIS, COLON, HX OF 09/19/2006  . GERD 03/01/2005    Past Surgical  History:  Procedure Laterality Date  . APPENDECTOMY  1980  . BACK SURGERY  1982, 1992  . CATARACT EXTRACTION, BILATERAL  2004  . EYE SURGERY  2005   laser eye  . SHOULDER SURGERY  2004   right       Home Medications    Prior to Admission medications   Medication Sig Start Date End Date Taking? Authorizing Provider  acetaminophen (MAPAP ARTHRITIS PAIN) 650 MG CR tablet TAKE ONE TABLET BY MOUTH 3 TIMES A DAY *DO NOT CRUSH* Patient taking differently: Take 650 mg by mouth 2 (two) times daily.  12/13/15   Karie Schwalbeichard I Letvak, MD  citalopram (CELEXA) 10 MG tablet Take 1 tablet (10 mg total) by mouth daily. 01/04/16   Karie Schwalbeichard I Letvak, MD  guaifenesin (ROBITUSSIN) 100 MG/5ML syrup Take 200 mg by mouth every 6 (six) hours as needed for cough.    Historical Provider, MD  polyethylene glycol (MIRALAX / GLYCOLAX) packet Take 17 g by mouth daily. 04/11/16   Rolly SalterPranav M Patel, MD  senna-docusate (SENOKOT-S) 8.6-50 MG tablet Take 1 tablet by mouth 2 (two) times daily. 04/10/16   Rolly SalterPranav M Patel, MD  vitamin B-12 (CYANOCOBALAMIN) 1000 MCG tablet Take 1 tablet (1,000 mcg total) by mouth daily. 12/13/15   Karie Schwalbeichard I Letvak, MD    Family History Family History  Problem Relation Age of Onset  . Asthma Mother   . Alzheimer's disease Mother   . Kidney disease Father   . COPD Brother   . Alzheimer's disease Brother   . Arthritis Brother  Social History Social History  Substance Use Topics  . Smoking status: Former Smoker    Years: 50.00    Types: Cigarettes  . Smokeless tobacco: Never Used  . Alcohol use No     Allergies   Codeine   Review of Systems Review of Systems  Unable to perform ROS: Dementia     Physical Exam Updated Vital Signs BP (!) 104/52 (BP Location: Right Arm)   Pulse (!) 57   Resp 16   SpO2 96%   Physical Exam  Constitutional: He appears well-developed and well-nourished.  HENT:  Head: Normocephalic and atraumatic.  Cardiovascular:  Bradycardic, distant heart  sounds  Pulmonary/Chest: Effort normal and breath sounds normal. No respiratory distress.  Abdominal: Soft. There is no rebound and no guarding.  Mild diffuse abdominal tenderness  Musculoskeletal: He exhibits no edema or tenderness.  Neurological:  Lethargic, generalized weakness, disoriented to place and time.   Skin: Skin is warm and dry. There is pallor.  Psychiatric: He has a normal mood and affect. His behavior is normal.  Nursing note and vitals reviewed.    ED Treatments / Results  Labs (all labs ordered are listed, but only abnormal results are displayed) Labs Reviewed  CULTURE, BLOOD (ROUTINE X 2)  CULTURE, BLOOD (ROUTINE X 2)  COMPREHENSIVE METABOLIC PANEL  CBC WITH DIFFERENTIAL/PLATELET  I-STAT CHEM 8, ED  I-STAT TROPOININ, ED  I-STAT CG4 LACTIC ACID, ED    EKG  EKG Interpretation  Date/Time:  Thursday April 13 2016 10:32:40 EDT Ventricular Rate:  53 PR Interval:    QRS Duration: 117 QT Interval:  493 QTC Calculation: 463 R Axis:   7 Text Interpretation:  Sinus bradycardia Nonspecific intraventricular conduction delay Borderline low voltage, extremity leads Nonspecific T abnrm, anterolateral leads Confirmed by Lincoln Brighamees, Liz [phone]([id number]) on 04/13/2016 10:48:43 AM       Radiology No results found.  Procedures Procedures (including critical care time) EMERGENCY DEPARTMENT US ABD/AORTA EXAM Study: Limited Ultrasound of the Abdominal Aorta.  INDICATIONS:Hypotension Indication: Multiple views of the abdominal aorta are obtained from the diaphragmatic hiatus to the aortic bifurcation in transverse and sagittal planes with a multi- Frequency probe.  PERFORMED BY: Myself  IMAGES ARCHIVED?: Yes  FINDINGS: Free fluid absent   LIMITATIONS:  Bowel gas  INTERPRETATION:  No abdominal aortic aneurysm  COMMENT:  Proximal aorta 2.8x2cm, distal aorta 2x1.5 cm. tortuosity  Medications Ordered in ED Medications  sodium chloride 0.9 % bolus 1,000 mL (1,000 mLs  Intravenous New Bag/Given 04/13/16 1041)     Initial Impression / Assessment and Plan / ED Course  I have reviewed the triage vital signs and the nursing notes.  Pertinent labs & imaging results that were available during my care of the patient were reviewed by me and considered in my medical decision making (see chart for details).  Clinical Course    Patient with history of confusion and frequent falls here following a syncopal event with bradycardia and hypotension on an epinephrine drip by EMS. Epinephrine was discontinued on ED arrival and he was given a normal saline bolus with no recurrent hypotension in the emergency department. Bedside ultrasound demonstrates no AAA. Plan to admit for further workup and treatment.  Final Clinical Impressions(s) / ED Diagnoses   Final diagnoses:  Confusion    New Prescriptions New Prescriptions   No medications on file     Tilden FossaElizabeth Yessenia Maillet, MD 04/14/16 [phone]

## 2016-04-13 NOTE — H&P (Signed)
History and Physical    [name] [name] ZOX:[id number] DOB: 10/26/1926 DOA: 04/13/2016  PCP: Tillman Abideichard Letvak, MD Patient coming from: home  Chief Complaint: syncope  HPI: [name] DandyWesley O Natal is a 82 y.o. male with medical history significant advanced dementia, hypertension, GERD, anemia, falls presents to the emergency department with the chief complaint of syncope. Initial evaluation reveals hypothermia bradycardia blood pressure at the low end of normal concerning for sepsis.  Formation is obtained from the chart as patient with advanced dementia unable to provide hx. review indicates patient come in from home family reports having several falls recently. Reportedly this morning he was up urinating and had a syncopal episode. EMS was called and found the systolic blood pressure in the 80s. They also reported he was pale and diaphoretic with a heart rate in the mid 30s. He complained of nausea and 4 mg of Zofran was given as well as 1 L of normal saline. He was placed in Trendelenburg and drip was started. Reportedly he was alert but confused at baseline.  According to the chart patient discharge 3 days ago for acute encephalopathy/ataxia and acute cystitis. He completed antibiotics and was discharged to home. Correlating to the chart wife reported at discharge patient unable to complete ADLs independently and prior to the hospitalization for UTI he was able to walk feed himself and fluid on his own. No reports of fever vomiting diarrhea. He reports of patient complaining of headache dizziness chest pain palpitations shortness of breath yesterday he became more weak and unable to walk. He fell out of his bed hitting his head. PCP was called and they recommended he come to the ED. Ration opted to call home health agency and request an extra visit.  ED Course: In the emergency department he is hypothermic slightly bradycardic with a soft blood pressure. He is provided with IV fluids and a warming  blanket.  Review of Systems: As per HPI otherwise 10 point review of systems negative.   Ambulatory Status: Prior To recent hospitalization independent ambulation with no falls  Past Medical History:  Diagnosis Date  . Alzheimer disease   . ALZHEIMERS DISEASE 09/25/2008  . Anxiety   . ARTHRITIS 09/19/2006  . B12 DEFICIENCY 11/23/2008  . DIVERTICULOSIS, COLON, HX OF 09/19/2006  . GERD (gastroesophageal reflux disease)   . Hypertension   . HYPERTENSION 09/19/2006  . VENOUS INSUFFICIENCY 09/19/2006    Past Surgical History:  Procedure Laterality Date  . APPENDECTOMY  1980  . BACK SURGERY  1982, 1992  . CATARACT EXTRACTION, BILATERAL  2004  . EYE SURGERY  2005   laser eye  . SHOULDER SURGERY  2004   right    Social History   Social History  . Marital status: Married    Spouse name: N/A  . Number of children: 2  . Years of education: N/A   Occupational History  . grading work    Social History Main Topics  . Smoking status: Former Smoker    Years: 50.00    Types: Cigarettes  . Smokeless tobacco: Never Used  . Alcohol use No  . Drug use: No  . Sexual activity: No   Other Topics Concern  . Not on file   Social History Narrative   Wife is health care POA   DNR order done 06/18/14   No prolonged ventilation and no tube feeds    Allergies  Allergen Reactions  . Codeine Nausea And Vomiting    Family History  Problem Relation Age  of Onset  . Asthma Mother   . Alzheimer's disease Mother   . Kidney disease Father   . COPD Brother   . Alzheimer's disease Brother   . Arthritis Brother     Prior to Admission medications   Medication Sig Start Date End Date Taking? Authorizing Provider  acetaminophen (MAPAP ARTHRITIS PAIN) 650 MG CR tablet TAKE ONE TABLET BY MOUTH 3 TIMES A DAY *DO NOT CRUSH* Patient taking differently: Take 650 mg by mouth 2 (two) times daily.  12/13/15  Yes Karie Schwalbe, MD  citalopram (CELEXA) 10 MG tablet Take 1 tablet (10 mg total) by mouth  daily. 01/04/16  Yes Karie Schwalbe, MD  guaifenesin (ROBITUSSIN) 100 MG/5ML syrup Take 200 mg by mouth every 6 (six) hours as needed for cough.   Yes Historical Provider, MD  polyethylene glycol (MIRALAX / GLYCOLAX) packet Take 17 g by mouth daily. 04/11/16  Yes Rolly Salter, MD  senna-docusate (SENOKOT-S) 8.6-50 MG tablet Take 1 tablet by mouth 2 (two) times daily. 04/10/16  Yes Rolly Salter, MD  vitamin B-12 (CYANOCOBALAMIN) 1000 MCG tablet Take 1 tablet (1,000 mcg total) by mouth daily. 12/13/15  Yes Karie Schwalbe, MD    Physical Exam: Vitals:   04/13/16 1130 04/13/16 1200 04/13/16 1230 04/13/16 1245  BP: 114/94 103/61 114/66 113/63  Pulse: (!) 58 (!) 54 (!) 56 (!) 56  Resp: 19 16 18 16   Temp:      TempSrc:      SpO2: 100% 97% 94% 100%     General:  Appears Slightly lethargic but none comfortable Eyes:  PERRL, EOMI, normal lids, iris ENT:  grossly normal hearing, lips & tongue, his membranes of his mouth are pink slightly dry Neck:  no LAD, masses or thyromegaly Cardiovascular:  Bradycardic but regular, no m/r/g. No LE edema.  Respiratory:  Normal effort breath sounds somewhat distant but clear hear no wheeze Abdomen:  soft, ntnd, positive bowel sounds throughout no guarding or rebounding Skin:  no rash or induration seen on limited exam Musculoskeletal:  grossly normal tone BUE/BLE, good ROM, no bony abnormality Psychiatric:  grossly normal mood and affect, speech fluent and appropriate,  Neurologic:  Arouses briefly to verbal stimuli is able to repeat 3 views day but answers yes to everything. Does not follow simple commands  Labs on Admission: I have personally reviewed following labs and imaging studies  CBC:  Recent Labs Lab 04/07/16 1547 04/09/16 0538 04/13/16 1038 04/13/16 1119  WBC 7.0 4.9 5.8  --   NEUTROABS 4.7  --  4.3  --   HGB 14.0 13.0 12.6* 11.6*  HCT 40.6 39.4 37.1* 34.0*  MCV 97.4 100.3* 98.1  --   PLT 234 203 151  --    Basic Metabolic  Panel:  Recent Labs Lab 04/07/16 1547 04/09/16 0538 04/10/16 0634 04/13/16 1038 04/13/16 1119  NA 141 140 139 140 142  K 3.9 3.7 3.5 3.3* 3.3*  CL 112* 112* 111 115* 111  CO2 21* 24 22 19*  --   GLUCOSE 111* 94 90 129* 124*  BUN 18 13 13 15 17   CREATININE 0.97 0.92 0.88 0.91 0.90  CALCIUM 9.3 9.4 9.1 8.2*  --   MG  --  2.4  --   --   --    GFR: Estimated Creatinine Clearance: 65.7 mL/min (by C-G formula based on SCr of 0.9 mg/dL). Liver Function Tests:  Recent Labs Lab 04/07/16 1547 04/13/16 1038  AST 37 24  ALT 24 18  ALKPHOS 62 56  BILITOT 0.9 1.1  PROT 6.2* 4.9*  ALBUMIN 3.8 3.1*   No results for input(s): LIPASE, AMYLASE in the last 168 hours. No results for input(s): AMMONIA in the last 168 hours. Coagulation Profile: No results for input(s): INR, PROTIME in the last 168 hours. Cardiac Enzymes: No results for input(s): CKTOTAL, CKMB, CKMBINDEX, TROPONINI in the last 168 hours. BNP (last 3 results) No results for input(s): PROBNP in the last 8760 hours. HbA1C: No results for input(s): HGBA1C in the last 72 hours. CBG: No results for input(s): GLUCAP in the last 168 hours. Lipid Profile: No results for input(s): CHOL, HDL, LDLCALC, TRIG, CHOLHDL, LDLDIRECT in the last 72 hours. Thyroid Function Tests: No results for input(s): TSH, T4TOTAL, FREET4, T3FREE, THYROIDAB in the last 72 hours. Anemia Panel: No results for input(s): VITAMINB12, FOLATE, FERRITIN, TIBC, IRON, RETICCTPCT in the last 72 hours. Urine analysis:    Component Value Date/Time   COLORURINE AMBER (A) 04/13/2016 1102   APPEARANCEUR CLOUDY (A) 04/13/2016 1102   LABSPEC 1.025 04/13/2016 1102   PHURINE 5.5 04/13/2016 1102   GLUCOSEU NEGATIVE 04/13/2016 1102   HGBUR NEGATIVE 04/13/2016 1102   BILIRUBINUR SMALL (A) 04/13/2016 1102   BILIRUBINUR small 01/16/2013 1630   KETONESUR NEGATIVE 04/13/2016 1102   PROTEINUR 30 (A) 04/13/2016 1102   UROBILINOGEN negative 01/16/2013 1630    UROBILINOGEN 1.0 07/15/2011 1917   NITRITE NEGATIVE 04/13/2016 1102   LEUKOCYTESUR NEGATIVE 04/13/2016 1102    Creatinine Clearance: Estimated Creatinine Clearance: 65.7 mL/min (by C-G formula based on SCr of 0.9 mg/dL).  Sepsis Labs: @LABRCNTIP (procalcitonin:4,lacticidven:4) )No results found for this or any previous visit (from the past 240 hour(s)).   Radiological Exams on Admission: Dg Chest Port 1 View  Result Date: 04/13/2016 CLINICAL DATA:  Bradycardia and hypotension.  Lethargy. EXAM: PORTABLE CHEST 1 VIEW COMPARISON:  04/07/2016.  04/01/2016. FINDINGS: Heart size is normal. Mediastinum appears wide, but this is presumed related to the AP portable technique. There is a poor inspiration with low lung volumes. Question early interstitial edema. No effusions. No lobar collapse. IMPRESSION: Portable AP supine film. Mediastinum appears prominent, probably related to the technique. Consider follow-up when able. Poor inspiration. Interstitial prominence that may relate to the poor inspiration or could represent early interstitial edema. Electronically Signed   By: Paulina Fusi M.D.   On: 04/13/2016 11:12    EKG: Independently reviewed. Sinus bradycardia Nonspecific intraventricular conduction delay Borderline low voltage, extremity leads Nonspecific T abnrm, anterolateral leads  Assessment/Plan Principal Problem:   Syncope Active Problems:   Alzheimer's dementia without behavioral disturbance   Essential hypertension, benign   GERD   Bradycardia   Hypokalemia   Anemia   Hypothermia   #1. Syncope. Reportedly syncopized while voiding. May be vagal response in the setting hypothermia. Epi gtt discontinued in the emergency department. Lactic acid within the limits of normal. Heart rate 56 blood pressure 113/63 no leukocytosis chest  x-ray without infiltrate -Admit to telemetry -Obtain orthostatics -echo -Gentle IV fluids -Obtain TSH -Obtain cortisol level -cycle  troponin -PT  #2. Bradycardia. Heart rate mid 50s in the emergency department. Review indicates this is close to his baseline. EKG as noted above. Not on any anti-arrhythmias. Initial troponin negative -Cycle troponin -Serial EKG -May need outpatient cardiology consult/Holter monitor -not sure he is pacer candidate  3. Hypothermia. Etiology uncertain. Rectal temp 95 on admission. Provided with warming blanket in the emergency department. No leukocytosis. Chest x-ray without infiltrate no signs of  infectious process. -IV fluids -Continue warming blanket - TSH -cortisol level  #4. Hypokalemia. Potassium 3.3 -We'll supplement via IV fluids -Check magnesium level -Recheck in the morning  #5. Anemia. History of iron deficiency anemia. Hemoglobin 11.6 on admission. This is close to his baseline. No signs symptoms of active bleeding -Monitor -Outpatient follow-up  #6. Alzheimer's, advanced without behavioral disturbance. Patient getting difficult to manage at home. He shares difficult to find placement as last 2 hospitalization he was classified as observation -PT evaluation -case management    DVT prophylaxis: lovenox  Code Status: full Family Communication: none present  Disposition Plan: needs placement  Consults called: none  Admission status: obs    Gwenyth BenderBLACK,Cheyrl Buley M MD [facility]  If 7PM-7AM, please contact night-coverage [email or web address] Password [email or web address]  04/13/2016, 12:51 PM

## 2016-04-13 NOTE — ED Notes (Signed)
Attempted report x 2 

## 2016-04-13 NOTE — ED Notes (Signed)
Attempted report 

## 2016-04-13 NOTE — ED Notes (Signed)
Gave report. Pt transporting to 3W

## 2016-04-13 NOTE — Progress Notes (Signed)
Pt's trop .06. NP on call paged.

## 2016-04-13 NOTE — ED Notes (Signed)
Re-checked rectal temp 95. Will continuing warming blanket- bair hugger

## 2016-04-13 NOTE — ED Notes (Signed)
[name] SicklesKasey [name] Pt's granddaughter cell phone [phone]

## 2016-04-13 NOTE — Progress Notes (Signed)
Pt unable to stand and follow commands, unable to do orthos at this time. Will cont to monitor pt.

## 2016-04-13 NOTE — ED Notes (Addendum)
Epi drip that EMS started has been discontinued

## 2016-04-13 NOTE — ED Triage Notes (Signed)
Pt from home, having recent falls- bruising to right side of face from previous fall. This morning, pt was urinating on toilet- had syncopal episode. EMS arrived, pt BP 80's systolic. Pt diaphoretic and pale HR dipped into 30's. Pt became nauseated. EMS gave 4 mg zofran, 1 liter NS, placed pt in trendelenburg and started epi drip at 964mcg/min. Pt alert- confused at baseline.

## 2016-04-14 ENCOUNTER — Other Ambulatory Visit (HOSPITAL_COMMUNITY): Payer: Medicare Other

## 2016-04-14 DIAGNOSIS — I499 Cardiac arrhythmia, unspecified: Secondary | ICD-10-CM | POA: Diagnosis not present

## 2016-04-14 DIAGNOSIS — F3289 Other specified depressive episodes: Secondary | ICD-10-CM | POA: Diagnosis not present

## 2016-04-14 DIAGNOSIS — K5900 Constipation, unspecified: Secondary | ICD-10-CM | POA: Diagnosis not present

## 2016-04-14 DIAGNOSIS — D518 Other vitamin B12 deficiency anemias: Secondary | ICD-10-CM | POA: Diagnosis not present

## 2016-04-14 DIAGNOSIS — R55 Syncope and collapse: Principal | ICD-10-CM

## 2016-04-14 DIAGNOSIS — Z515 Encounter for palliative care: Secondary | ICD-10-CM | POA: Diagnosis not present

## 2016-04-14 DIAGNOSIS — G9340 Encephalopathy, unspecified: Secondary | ICD-10-CM | POA: Diagnosis not present

## 2016-04-14 DIAGNOSIS — Z79899 Other long term (current) drug therapy: Secondary | ICD-10-CM | POA: Diagnosis not present

## 2016-04-14 DIAGNOSIS — G308 Other Alzheimer's disease: Secondary | ICD-10-CM | POA: Diagnosis not present

## 2016-04-14 DIAGNOSIS — E860 Dehydration: Secondary | ICD-10-CM

## 2016-04-14 DIAGNOSIS — D509 Iron deficiency anemia, unspecified: Secondary | ICD-10-CM | POA: Diagnosis present

## 2016-04-14 DIAGNOSIS — M6281 Muscle weakness (generalized): Secondary | ICD-10-CM | POA: Diagnosis not present

## 2016-04-14 DIAGNOSIS — Z66 Do not resuscitate: Secondary | ICD-10-CM | POA: Diagnosis not present

## 2016-04-14 DIAGNOSIS — R680 Hypothermia, not associated with low environmental temperature: Secondary | ICD-10-CM | POA: Diagnosis present

## 2016-04-14 DIAGNOSIS — E876 Hypokalemia: Secondary | ICD-10-CM | POA: Diagnosis present

## 2016-04-14 DIAGNOSIS — R638 Other symptoms and signs concerning food and fluid intake: Secondary | ICD-10-CM | POA: Diagnosis not present

## 2016-04-14 DIAGNOSIS — Z5189 Encounter for other specified aftercare: Secondary | ICD-10-CM | POA: Diagnosis not present

## 2016-04-14 DIAGNOSIS — K219 Gastro-esophageal reflux disease without esophagitis: Secondary | ICD-10-CM | POA: Diagnosis present

## 2016-04-14 DIAGNOSIS — R001 Bradycardia, unspecified: Secondary | ICD-10-CM | POA: Diagnosis not present

## 2016-04-14 DIAGNOSIS — I959 Hypotension, unspecified: Secondary | ICD-10-CM | POA: Diagnosis not present

## 2016-04-14 DIAGNOSIS — G309 Alzheimer's disease, unspecified: Secondary | ICD-10-CM | POA: Diagnosis present

## 2016-04-14 DIAGNOSIS — Z87891 Personal history of nicotine dependence: Secondary | ICD-10-CM | POA: Diagnosis not present

## 2016-04-14 DIAGNOSIS — R296 Repeated falls: Secondary | ICD-10-CM | POA: Diagnosis not present

## 2016-04-14 DIAGNOSIS — Z885 Allergy status to narcotic agent status: Secondary | ICD-10-CM | POA: Diagnosis not present

## 2016-04-14 DIAGNOSIS — I119 Hypertensive heart disease without heart failure: Secondary | ICD-10-CM | POA: Diagnosis present

## 2016-04-14 DIAGNOSIS — F0281 Dementia in other diseases classified elsewhere with behavioral disturbance: Secondary | ICD-10-CM | POA: Diagnosis not present

## 2016-04-14 DIAGNOSIS — F0391 Unspecified dementia with behavioral disturbance: Secondary | ICD-10-CM | POA: Diagnosis not present

## 2016-04-14 LAB — BASIC METABOLIC PANEL
ANION GAP: 6 (ref 5–15)
BUN: 13 mg/dL (ref 6–20)
CHLORIDE: 111 mmol/L (ref 101–111)
CO2: 20 mmol/L — AB (ref 22–32)
Calcium: 8.7 mg/dL — ABNORMAL LOW (ref 8.9–10.3)
Creatinine, Ser: 0.88 mg/dL (ref 0.61–1.24)
GFR calc non Af Amer: >60 mL/min (ref >60)
Glucose, Bld: 85 mg/dL (ref 65–99)
Potassium: 3.7 mmol/L (ref 3.5–5.1)
SODIUM: 137 mmol/L (ref 135–145)

## 2016-04-14 LAB — CBC
HCT: 35.9 % — ABNORMAL LOW (ref 39.0–52.0)
HEMOGLOBIN: 12.5 g/dL — AB (ref 13.0–17.0)
MCH: 33.3 pg (ref 26.0–34.0)
MCHC: 34.8 g/dL (ref 30.0–36.0)
MCV: 95.7 fL (ref 78.0–100.0)
Platelets: 208 10*3/uL (ref 150–400)
RBC: 3.75 MIL/uL — AB (ref 4.22–5.81)
RDW: 13.7 % (ref 11.5–15.5)
WBC: 6.1 10*3/uL (ref 4.0–10.5)

## 2016-04-14 LAB — URINE CULTURE: CULTURE: NO GROWTH

## 2016-04-14 MED ORDER — SODIUM CHLORIDE 0.45 % IV SOLN
INTRAVENOUS | Status: DC
  Administered 2016-04-14: 18:00:00 via INTRAVENOUS
  Administered 2016-04-15 – 2016-04-16 (×2): 100 mL/h via INTRAVENOUS
  Administered 2016-04-16 – 2016-04-17 (×3): via INTRAVENOUS

## 2016-04-14 NOTE — Care Management Note (Addendum)
Case Management Note  Patient Details  Name: [name] [name] MRN: [id number] Date of Birth: 06/28/1926  Subjective/Objective:  Pt presented with Advanced Dementia,Failure to Thrive and  Syncopal Episode -Pt given IV bolus upon admission. Pt is from home with wife. [facility]. Palliative Care to be consulted.  PT/OT recommendations for SNF. Plan will be for SNF once stable for d/c.                   Action/Plan: [facility] is aware that pt is hospitalized. CSW is assisting with disposition needs. CM will continue to monitor for additional needs.   Expected Discharge Date:                  Expected Discharge Plan:  Skilled Nursing Facility  In-House Referral:  Clinical Social Work  Discharge planning Services  CM Consult  Post Acute Care Choice:   N/A Choice offered to:  N/A  DME Arranged:   N/A DME Agency:   N/A  HH Arranged:  RN, PT, NA, Social Work (Currently Active with [facility]) HH Agency:  [facility]  Status of Service:  In process, will continue to follow  If discussed at Long Length of Stay Meetings, dates discussed:    Additional Comments: Pt will d/c to [facility] 04-17-16. CSW assisting with disposition needs. No further needs from CM at this time.  Gala LewandowskyGraves-Bigelow, Asmar Brozek Kaye, RN 04/14/2016, 4:05 PM

## 2016-04-14 NOTE — Progress Notes (Addendum)
PROGRESS NOTE  [name] [name]  ZOX:[id number] DOB: 27-Jan-1927 DOA: 04/13/2016 PCP: Tillman Abide, MD   Brief Narrative: [name] [name] is a 82 y.o. male with a history of alzheimer's dementia progressing over 10 years, HTN, GERD, anemia, and falls presenting with syncope. Information from wife due to patient disorientation. She reports that he nearly fell while in the shower being bathed by an aide when he became unresponsive. He did not fall, but was sat upon a toilet. He had been discharged 3 days prior due to acute encephalopathy in the setting of UTI and has had generalized weakness and greater dependence for ADLs since. EMS was called and reported SBP in 80's and HR in 30's. On arrival hypothermia, bradycardia, and hypotension was concerning for sepsis. IV fluids, warming blanket were given and blood and urine cultures collected. CT of the head was negative for acute intracranial cause of worsening encephalopathy. Neurology was consulted and recommended no further work up of encephalopathy.  Assessment & Plan: Principal Problem:   Syncope Active Problems:   Alzheimer's dementia without behavioral disturbance   Essential hypertension, benign   GERD   Acute encephalopathy   Bradycardia   Hypokalemia   Anemia   Hypothermia   Decreased oral intake   Dehydration  Acute encephalopathy on alzheimer's dementia, progressive and advances with behavioral disturbance: Patient getting difficult to manage at home. Difficult to find placement as last 2 hospitalization he was classified as observation - Neuro recommendations appreciated: possibly new baseline? - Palliative care consult - Admit to inpatient due to dehydration requiring IV fluids and ongoing poor po intake. Also needs palliative care consultation and monitoring of symptomatic bradycardia. - PT evaluation - CM and CSW consultants appreciated  Syncope: Reportedly syncopized while voiding. May be vagal response in the setting  hypothermia. Epi gtt discontinued in the emergency department. Lactic acid within the limits of normal. Heart rate 56 blood pressure 113/63 no leukocytosis chest  x-ray without infiltrate - Obtain orthostatic vital signs - Continue IV fluids - Check echocardiogram  Bradycardia: Symptomatic. Not on any anti-arrhythmias. Initial troponin negative.  - Cycle troponins 0.06 flat trending. ECG without ischemia. - May need outpatient cardiology consult/Holter monitor. Not likely a PPM candidate.  Hypothermia: Rectal temp 95 on admission. No obvious sepsis. No leukocytosis. Chest x-ray without infiltrate no signs of infectious process. TSH and cortisol wnl.  - Continue IVF given poor po intake and dehydration.  Hypokalemia: Resolved s/p IV repletion. . Potassium 3.3 - Check magnesium with BMP in AM  Iron deficiency anemia: Chronic. Hemoglobin 11.6 on admission without signs of active bleeding. - Monitor - Outpatient follow-up  DVT prophylaxis: Lovenox Code Status: DNR Family Communication: Wife at bedside Disposition Plan: Admit to inpatient, anticipate discharge to SNF pending PT evaluation and improvement in dehydration.  Consultants:   Palliative Care  Procedures:   Echo pending  Antimicrobials:  None   Subjective: Patient disoriented, denies pain or any other complaints.  Objective: Vitals:   04/14/16 0321 04/14/16 0349 04/14/16 0757 04/14/16 1142  BP: 125/72 132/68 128/63 134/67  Pulse:   (!) 51 (!) 54  Resp: 17 20 (!) 22 19  Temp:   97.8 F (36.6 C) 98.6 F (37 C)  TempSrc:   Oral Axillary  SpO2:   98% 97%  Weight:      Height:        Intake/Output Summary (Last 24 hours) at 04/14/16 1617 Last data filed at 04/14/16 1224  Gross per 24 hour  Intake  1751.25 ml  Output              150 ml  Net          1601.25 ml   Filed Weights   04/14/16 0200  Weight: 98.4 kg (216 lb 14.9 oz)    Examination: General exam: 82 y.o. male in no  distress Respiratory system: Non-labored breathing ambient air. Clear to auscultation bilaterally.  Cardiovascular system: Bradycardic. No murmur, rub, or gallop. No JVD, and no pedal edema. Gastrointestinal system: Abdomen soft, non-tender, non-distended, with normoactive bowel sounds. No organomegaly or masses felt. Central nervous system: Alert but disoriented x4. No focal neurological deficits. Extremities: Warm, no deformities Skin: No rashes, lesions or ulcers Psychiatry: Judgement and insight appear normal. Mood & affect appropriate.   Data Reviewed: I have personally reviewed following labs and imaging studies  CBC:  Recent Labs Lab 04/09/16 0538 04/13/16 1038 04/13/16 1119 04/14/16 0227  WBC 4.9 5.8  --  6.1  NEUTROABS  --  4.3  --   --   HGB 13.0 12.6* 11.6* 12.5*  HCT 39.4 37.1* 34.0* 35.9*  MCV 100.3* 98.1  --  95.7  PLT 203 151  --  208   Basic Metabolic Panel:  Recent Labs Lab 04/09/16 0538 04/10/16 0634 04/13/16 1038 04/13/16 1119 04/14/16 0227  NA 140 139 140 142 137  K 3.7 3.5 3.3* 3.3* 3.7  CL 112* 111 115* 111 111  CO2 24 22 19*  --  20*  GLUCOSE 94 90 129* 124* 85  BUN 13 13 15 17 13   CREATININE 0.92 0.88 0.91 0.90 0.88  CALCIUM 9.4 9.1 8.2*  --  8.7*  MG 2.4  --   --   --   --    GFR: Estimated Creatinine Clearance: 68.4 mL/min (by C-G formula based on SCr of 0.88 mg/dL). Liver Function Tests:  Recent Labs Lab 04/13/16 1038  AST 24  ALT 18  ALKPHOS 56  BILITOT 1.1  PROT 4.9*  ALBUMIN 3.1*   No results for input(s): LIPASE, AMYLASE in the last 168 hours. No results for input(s): AMMONIA in the last 168 hours. Coagulation Profile: No results for input(s): INR, PROTIME in the last 168 hours. Cardiac Enzymes:  Recent Labs Lab 04/13/16 1600 04/13/16 2143  TROPONINI 0.06* 0.06*   BNP (last 3 results) No results for input(s): PROBNP in the last 8760 hours. HbA1C: No results for input(s): HGBA1C in the last 72 hours. CBG: No  results for input(s): GLUCAP in the last 168 hours. Lipid Profile: No results for input(s): CHOL, HDL, LDLCALC, TRIG, CHOLHDL, LDLDIRECT in the last 72 hours. Thyroid Function Tests:  Recent Labs  04/13/16 1600  TSH 1.445   Anemia Panel: No results for input(s): VITAMINB12, FOLATE, FERRITIN, TIBC, IRON, RETICCTPCT in the last 72 hours. Urine analysis:    Component Value Date/Time   COLORURINE AMBER (A) 04/13/2016 1102   APPEARANCEUR CLOUDY (A) 04/13/2016 1102   LABSPEC 1.025 04/13/2016 1102   PHURINE 5.5 04/13/2016 1102   GLUCOSEU NEGATIVE 04/13/2016 1102   HGBUR NEGATIVE 04/13/2016 1102   BILIRUBINUR SMALL (A) 04/13/2016 1102   BILIRUBINUR small 01/16/2013 1630   KETONESUR NEGATIVE 04/13/2016 1102   PROTEINUR 30 (A) 04/13/2016 1102   UROBILINOGEN negative 01/16/2013 1630   UROBILINOGEN 1.0 07/15/2011 1917   NITRITE NEGATIVE 04/13/2016 1102   LEUKOCYTESUR NEGATIVE 04/13/2016 1102   Sepsis Labs: @LABRCNTIP (procalcitonin:4,lacticidven:4)  ) Recent Results (from the past 240 hour(s))  Culture, Urine     Status:  None   Collection Time: 04/13/16 11:02 AM  Result Value Ref Range Status   Specimen Description URINE, CATHETERIZED  Final   Special Requests NONE  Final   Culture NO GROWTH  Final   Report Status 04/14/2016 FINAL  Final  Culture, blood (routine x 2)     Status: None (Preliminary result)   Collection Time: 04/13/16 11:10 AM  Result Value Ref Range Status   Specimen Description BLOOD RIGHT HAND  Final   Special Requests BOTTLES DRAWN AEROBIC ONLY 5CC  Final   Culture NO GROWTH 1 DAY  Final   Report Status PENDING  Incomplete  Culture, blood (routine x 2)     Status: None (Preliminary result)   Collection Time: 04/13/16 11:15 AM  Result Value Ref Range Status   Specimen Description BLOOD RIGHT ANTECUBITAL  Final   Special Requests BOTTLES DRAWN AEROBIC ONLY 5CC  Final   Culture NO GROWTH 1 DAY  Final   Report Status PENDING  Incomplete  MRSA PCR Screening      Status: None   Collection Time: 04/13/16  3:09 PM  Result Value Ref Range Status   MRSA by PCR NEGATIVE NEGATIVE Final    Comment:        The GeneXpert MRSA Assay (FDA approved for NASAL specimens only), is one component of a comprehensive MRSA colonization surveillance program. It is not intended to diagnose MRSA infection nor to guide or monitor treatment for MRSA infections.      Radiology Studies: Ct Head Wo Contrast  Result Date: 04/13/2016 CLINICAL DATA:  Confusion EXAM: CT HEAD WITHOUT CONTRAST TECHNIQUE: Contiguous axial images were obtained from the base of the skull through the vertex without intravenous contrast. COMPARISON:  MRI 04/08/2016.  CT scan brain 04/07/2016 FINDINGS: Brain: No acute territorial infarction, intracranial hemorrhage or focal mass lesion. Moderate-to-marked global atrophy. Moderate periventricular white matter hypodensities consistent with small vessel disease, unchanged. Ventricular size and morphology is similar compared to prior exam. Stable lipoma of the corpus callosum. Vascular: No hyperdense vessels. Mild carotid artery calcifications at the skullbase. Skull: Mastoid air cells are clear.  No skull fracture. Sinuses/Orbits: Mucous retention cysts within the maxillary sinuses. Mild mucosal thickening in the ethmoid sinuses. Mild bony remottling of the sphenoid sinuses unchanged. No acute orbital abnormality. Other: None IMPRESSION: 1. No CT evidence for acute intracranial abnormality 2. Other chronic changes as previously described. Electronically Signed   By: Jasmine Pang M.D.   On: 04/13/2016 18:04   Dg Chest Port 1 View  Result Date: 04/13/2016 CLINICAL DATA:  Bradycardia and hypotension.  Lethargy. EXAM: PORTABLE CHEST 1 VIEW COMPARISON:  04/07/2016.  04/01/2016. FINDINGS: Heart size is normal. Mediastinum appears wide, but this is presumed related to the AP portable technique. There is a poor inspiration with low lung volumes. Question early  interstitial edema. No effusions. No lobar collapse. IMPRESSION: Portable AP supine film. Mediastinum appears prominent, probably related to the technique. Consider follow-up when able. Poor inspiration. Interstitial prominence that may relate to the poor inspiration or could represent early interstitial edema. Electronically Signed   By: Paulina Fusi M.D.   On: 04/13/2016 11:12    Scheduled Meds: . acetaminophen  650 mg Oral BID  . citalopram  10 mg Oral Daily  . enoxaparin (LOVENOX) injection  40 mg Subcutaneous Q24H  . senna-docusate  1 tablet Oral BID  . sodium chloride flush  3 mL Intravenous Q12H  . vitamin B-12  1,000 mcg Oral Daily   Continuous Infusions:  LOS: 0 days   Time spent: 25 minutes.  Hazeline Junkeryan Navah Grondin, MD [facility] Pager [phone]  If 7PM-7AM, please contact night-coverage [email or web address] Password [email or web address] 04/14/2016, 4:17 PM

## 2016-04-14 NOTE — Progress Notes (Signed)
[facility]  Patient Status: Active (receiving services up to time of hospitalization)  [facility] is providing the following services: RN, PT, OT and MSW  If patient discharges after hours, please call [phone].   Kizzie FurnishDonna Fellmy 04/14/2016, 4:28 PM

## 2016-04-14 NOTE — Consult Note (Signed)
NEURO HOSPITALIST CONSULT NOTE   Requestig physician: Dr. Jarvis Newcomer   Reason for Consult: failure to thrive   History obtained from:  Chart  HPI:                                                                                                                                          [name] [name] is an 82 y.o. male with advanced dementia  who presented with syncopal episode and found to be hypothermic and bradycardiac. It was suspected he had a vasovagal episode. After being hospitalized a few weeks ago for dehydration and UTI it appears he has never gotten back to his baseline and the question of progression of his cognitive decline may be his current baseline.  Past Medical History:  Diagnosis Date  . Alzheimer disease   . ALZHEIMERS DISEASE 09/25/2008  . Anxiety   . ARTHRITIS 09/19/2006  . B12 DEFICIENCY 11/23/2008  . DIVERTICULOSIS, COLON, HX OF 09/19/2006  . GERD (gastroesophageal reflux disease)   . Hypertension   . HYPERTENSION 09/19/2006  . Hypokalemia 04/13/2016  . Syncope 04/13/2016  . VENOUS INSUFFICIENCY 09/19/2006    Past Surgical History:  Procedure Laterality Date  . APPENDECTOMY  1980  . BACK SURGERY  1982, 1992  . CATARACT EXTRACTION, BILATERAL  2004  . EYE SURGERY  2005   laser eye  . HERNIA REPAIR    . SHOULDER SURGERY  2004   right    Family History  Problem Relation Age of Onset  . Asthma Mother   . Alzheimer's disease Mother   . Kidney disease Father   . COPD Brother   . Alzheimer's disease Brother   . Arthritis Brother      Social History:  reports that he has quit smoking. His smoking use included Cigarettes. He quit after 50.00 years of use. He has never used smokeless tobacco. He reports that he does not drink alcohol or use drugs.  Allergies  Allergen Reactions  . Codeine Nausea And Vomiting    MEDICATIONS:                                                                                                                      I have reviewed the patient's current medications.   ROS:  History obtained from chart review  General ROS: negative for - chills, fatigue, fever, night sweats, weight gain or weight loss Psychological ROS: negative for - behavioral disorder, hallucinations, memory difficulties, mood swings or suicidal ideation Ophthalmic ROS: negative for - blurry vision, double vision, eye pain or loss of vision ENT ROS: negative for - epistaxis, nasal discharge, oral lesions, sore throat, tinnitus or vertigo Allergy and Immunology ROS: negative for - hives or itchy/watery eyes Hematological and Lymphatic ROS: negative for - bleeding problems, bruising or swollen lymph nodes Endocrine ROS: negative for - galactorrhea, hair pattern changes, polydipsia/polyuria or temperature intolerance Respiratory ROS: negative for - cough, hemoptysis, shortness of breath or wheezing Cardiovascular ROS: negative for - chest pain, dyspnea on exertion, edema or irregular heartbeat Gastrointestinal ROS: negative for - abdominal pain, diarrhea, hematemesis, nausea/vomiting or stool incontinence Genito-Urinary ROS: negative for - dysuria, hematuria, incontinence or urinary frequency/urgency Musculoskeletal ROS: negative for - joint swelling or muscular weakness Neurological ROS: as noted in HPI Dermatological ROS: negative for rash and skin lesion changes   Blood pressure 128/63, pulse (!) 51, temperature 97.8 F (36.6 C), temperature source Oral, resp. rate (!) 22, height 5\' 9"  (1.753 m), weight 98.4 kg (216 lb 14.9 oz), SpO2 98 %.   Neurologic Examination:                                                                                                      HEENT-  Normocephalic, no lesions, without obvious abnormality.  Normal external eye and conjunctiva.  Normal TM's bilaterally.  Normal  auditory canals and external ears. Normal external nose, mucus membranes and septum.  Normal pharynx. Cardiovascular- regular rate and rhythm, S1, S2 normal, no murmur, click, rub or gallop, pulses palpable throughout   Lungs- chest clear, no wheezing, rales, normal symmetric air entry, Heart exam - S1, S2 normal, no murmur, no gallop, rate regular Abdomen- soft, non-tender; bowel sounds normal; no masses,  no organomegaly   Neurological Examination Mental Status: Alert not oriented, very pleasant and appears happy.  Speech fluent without evidence of aphasia. Follows simple commands  Cranial Nerves: II: Visual fields grossly normal, pupils equal, round, reactive to light and accommodation III,IV, VI: ptosis not present, extra-ocular motions intact bilaterally V,VII: smile symmetric, facial light touch sensation normal bilaterally VIII: hearing normal bilaterally IX,X: uvula rises symmetrically XI: bilateral shoulder shrug XII: midline tongue extension Motor: Right : Upper extremity   5/5    Left:     Upper extremity   5/5  Lower extremity   5/5     Lower extremity   5/5 Tone and bulk:normal tone throughout; no atrophy noted Sensory: Pinprick and light touch intact throughout, bilaterally  Cerebellar: normal finger-to-nose     Lab Results: Basic Metabolic Panel:  Recent Labs Lab 04/07/16 1547 04/09/16 0538 04/10/16 0634 04/13/16 1038 04/13/16 1119 04/14/16 0227  NA 141 140 139 140 142 137  K 3.9 3.7 3.5 3.3* 3.3* 3.7  CL 112* 112* 111 115* 111 111  CO2 21* 24 22 19*  --  20*  GLUCOSE 111* 94  90 129* 124* 85  BUN 18 13 13 15 17 13   CREATININE 0.97 0.92 0.88 0.91 0.90 0.88  CALCIUM 9.3 9.4 9.1 8.2*  --  8.7*  MG  --  2.4  --   --   --   --     Liver Function Tests:  Recent Labs Lab 04/07/16 1547 04/13/16 1038  AST 37 24  ALT 24 18  ALKPHOS 62 56  BILITOT 0.9 1.1  PROT 6.2* 4.9*  ALBUMIN 3.8 3.1*   No results for input(s): LIPASE, AMYLASE in the last 168  hours. No results for input(s): AMMONIA in the last 168 hours.  CBC:  Recent Labs Lab 04/07/16 1547 04/09/16 0538 04/13/16 1038 04/13/16 1119 04/14/16 0227  WBC 7.0 4.9 5.8  --  6.1  NEUTROABS 4.7  --  4.3  --   --   HGB 14.0 13.0 12.6* 11.6* 12.5*  HCT 40.6 39.4 37.1* 34.0* 35.9*  MCV 97.4 100.3* 98.1  --  95.7  PLT 234 203 151  --  208    Cardiac Enzymes:  Recent Labs Lab 04/13/16 1600 04/13/16 2143  TROPONINI 0.06* 0.06*    Lipid Panel: No results for input(s): CHOL, TRIG, HDL, CHOLHDL, VLDL, LDLCALC in the last 168 hours.  CBG: No results for input(s): GLUCAP in the last 168 hours.  Microbiology: Results for orders placed or performed during the hospital encounter of 04/13/16  Culture, Urine     Status: None   Collection Time: 04/13/16 11:02 AM  Result Value Ref Range Status   Specimen Description URINE, CATHETERIZED  Final   Special Requests NONE  Final   Culture NO GROWTH  Final   Report Status 04/14/2016 FINAL  Final  MRSA PCR Screening     Status: None   Collection Time: 04/13/16  3:09 PM  Result Value Ref Range Status   MRSA by PCR NEGATIVE NEGATIVE Final    Comment:        The GeneXpert MRSA Assay (FDA approved for NASAL specimens only), is one component of a comprehensive MRSA colonization surveillance program. It is not intended to diagnose MRSA infection nor to guide or monitor treatment for MRSA infections.     Coagulation Studies: No results for input(s): LABPROT, INR in the last 72 hours.  Imaging: Ct Head Wo Contrast  Result Date: 04/13/2016 CLINICAL DATA:  Confusion EXAM: CT HEAD WITHOUT CONTRAST TECHNIQUE: Contiguous axial images were obtained from the base of the skull through the vertex without intravenous contrast. COMPARISON:  MRI 04/08/2016.  CT scan brain 04/07/2016 FINDINGS: Brain: No acute territorial infarction, intracranial hemorrhage or focal mass lesion. Moderate-to-marked global atrophy. Moderate periventricular white  matter hypodensities consistent with small vessel disease, unchanged. Ventricular size and morphology is similar compared to prior exam. Stable lipoma of the corpus callosum. Vascular: No hyperdense vessels. Mild carotid artery calcifications at the skullbase. Skull: Mastoid air cells are clear.  No skull fracture. Sinuses/Orbits: Mucous retention cysts within the maxillary sinuses. Mild mucosal thickening in the ethmoid sinuses. Mild bony remottling of the sphenoid sinuses unchanged. No acute orbital abnormality. Other: None IMPRESSION: 1. No CT evidence for acute intracranial abnormality 2. Other chronic changes as previously described. Electronically Signed   By: Jasmine Pang M.D.   On: 04/13/2016 18:04   Dg Chest Port 1 View  Result Date: 04/13/2016 CLINICAL DATA:  Bradycardia and hypotension.  Lethargy. EXAM: PORTABLE CHEST 1 VIEW COMPARISON:  04/07/2016.  04/01/2016. FINDINGS: Heart size is normal. Mediastinum appears wide, but this  is presumed related to the AP portable technique. There is a poor inspiration with low lung volumes. Question early interstitial edema. No effusions. No lobar collapse. IMPRESSION: Portable AP supine film. Mediastinum appears prominent, probably related to the technique. Consider follow-up when able. Poor inspiration. Interstitial prominence that may relate to the poor inspiration or could represent early interstitial edema. Electronically Signed   By: Paulina FusiMark  Shogry M.D.   On: 04/13/2016 11:12         Assessment/Plan: 82 y.o. male with advanced dementia  who presented with syncopal episode and found to be hypothermic and bradycardiac. It was suspected he had a vasovagal episode. After being hospitalized a few weeks ago for dehydration and UTI it appears he has never gotten back to his baseline and the question of progression of his cognitive decline may be his current baseline.   Based on his history, age, clinical presentation and Neuro imagining  His current  state of cognition is most likely a result of progression of his advanced dementia. Based on his imagining it appears that he does not have much cognitive reserve left and his current hospitalization for infection and dehydration may have tipped him over his reserve edge and this is most likely his new baseline.

## 2016-04-14 NOTE — Progress Notes (Signed)
Pt HR went into mid 30's several times overnight. Pt was asymptomatic: alert and confused at baseline.  Pt is unable to follow commands or stand at this time. Due to pt's recent history of falls, I felt it was in the best interest of patient safety to not attempt orthos at this time.  Shauna HughKaren Gurtaj Ruz, RN

## 2016-04-14 NOTE — Clinical Social Work Note (Signed)
Clinical Social Work Assessment  Patient Details  Name: [name] [name] MRN: [id number] Date of Birth: 02/24/1927  Date of referral:  04/14/16               Reason for consult:  Facility Placement, Discharge Planning                Permission sought to share information with:  Facility Medical sales representativeContact Representative, Family Supports Permission granted to share information::   (Patient unable to give consent)  Name::     Water engineereggy Yacoub  Agency::  SNFs  Relationship::     Contact Information:     Housing/Transportation Living arrangements for the past 2 months:  Single Family Home Source of Information:  Spouse Patient Interpreter Needed:  None Criminal Activity/Legal Involvement Pertinent to Current Situation/Hospitalization:  No - Comment as needed Significant Relationships:  Adult Children, Spouse Lives with:  Spouse Do you feel safe going back to the place where you live?  No Need for family participation in patient care:  Yes (Comment)  Care giving concerns:  The patient's wife reports that she does not feel she can manage the patient at home due to his increased care needs.   Social Worker assessment / plan:  CSW spoke with patient's wife by phone as no family currently at bedside and the patient is disoriented x4. The patient's wife reports that the past two years have been very stressful for her. [facility] which cost the family $5000/month. The wife states that they no longer have these kind of resources and are in the process of applying for Medicaid. She states that 04/28/16 marks five years since their assets were transferred from their names and that the patient should qualify for Medicaid at that time. CSW explained that since the patient is under observation and has not had a qualify 3 night inpatient stay within the past 30 days, the family will need to prepare to make arrangements for private pay or returning home. The wife  stated that she would be interested in looking at private pay options that would be willing to take the patient as privately pay upfront and transition to Medicaid once approved. CSW will make the referral. CSW explained the SNF search/placement process and answered the wife's questions. Wife Gigi Gineggy understands that the patient's options will likely be limited. CSW will follow.  Employment status:  Retired Health and safety inspectornsurance information:  Medicare PT Recommendations:  Not assessed at this time Information / Referral to community resources:  Other (Comment Required) (CSW will make referrals to SNFs for private pay per family's request)  Patient/Family's Response to care:  The patient's family seems happy with the care the patient is receiving.  Patient/Family's Understanding of and Emotional Response to Diagnosis, Current Treatment, and Prognosis:  The family appears to have a good understanding of the patient's reason for admission. The family is frustrated by the continued readmissions and the stress of caring for the patient at home.   Emotional Assessment Appearance:  Appears stated age Attitude/Demeanor/Rapport:  Unable to Assess Affect (typically observed):  Unable to Assess Orientation:  Fluctuating Orientation (Suspected and/or reported Sundowners) Alcohol / Substance use:  Never Used Psych involvement (Current and /or in the community):  No (Comment)  Discharge Needs  Concerns to be addressed:  Discharge Planning Concerns, Care Coordination Readmission within the last 30 days:  No Current discharge risk:  Cognitively Impaired, Physical Impairment Barriers to Discharge:  Continued Medical Work  up   Venita LickCampbell, [name] Pino B, LCSW 04/14/2016, 10:41 AM

## 2016-04-14 NOTE — NC FL2 (Signed)
[place] MEDICAID FL2 LEVEL OF CARE SCREENING TOOL     IDENTIFICATION  Patient Name: [name] [name] Birthdate: 09/01/1926 Sex: male Admission Date (Current Location): 04/13/2016  [place]   Facility and Address:  [facility], [address], [place], [place] [address]      Provider Number: [id number]  Attending Physician Name and Address:  Tyrone Nineyan B Grunz, MD  Relative Name and Phone Number:  Raj Januseggy Pyka, Spouse, [phone]    Current Level of Care: Hospital Recommended Level of Care: Skilled Nursing Facility Prior Approval Number:    Date Approved/Denied:   PASRR Number: [id number]  Discharge Plan: SNF    Current Diagnoses: Patient Active Problem List   Diagnosis Date Noted  . Bradycardia 04/13/2016  . Hypokalemia 04/13/2016  . Anemia 04/13/2016  . Hypothermia 04/13/2016  . Fall 04/07/2016  . Ataxia 04/07/2016  . Acute cystitis without hematuria 04/07/2016  . Acute encephalopathy 04/01/2016  . Malnutrition of mild degree (HCC) 12/30/2015  . Counseling regarding advanced directives 01/15/2014  . Diarrhea 12/30/2013  . Delusions (HCC) 10/02/2013  . Syncope 01/16/2013  . UTI (urinary tract infection) 07/15/2011  . Episodic mood disorder (HCC) 12/09/2009  . B12 deficiency 11/23/2008  . Alzheimer's dementia without behavioral disturbance 09/25/2008  . Essential hypertension, benign 09/19/2006  . VENOUS INSUFFICIENCY 09/19/2006  . Osteoarthritis, multiple sites 09/19/2006  . DIVERTICULOSIS, COLON, HX OF 09/19/2006  . GERD 03/01/2005    Orientation RESPIRATION BLADDER Height & Weight      (Disoriented x4)  Normal Incontinent Weight: 98.4 kg (216 lb 14.9 oz) Height:  5\' 9"  (175.3 cm)  BEHAVIORAL SYMPTOMS/MOOD NEUROLOGICAL BOWEL NUTRITION STATUS     (NONE) Incontinent Diet (Heart Healthy/Carb Modified)  AMBULATORY STATUS COMMUNICATION OF NEEDS Skin   Extensive Assist Verbally Normal                    Personal Care Assistance Level of Assistance  Bathing, Feeding, Dressing, Total care Bathing Assistance: Limited assistance Feeding assistance: Independent Dressing Assistance: Limited assistance Total Care Assistance: Maximum assistance   Functional Limitations Info  Sight, Hearing, Speech Sight Info: Adequate Hearing Info: Adequate Speech Info: Adequate    SPECIAL CARE FACTORS FREQUENCY  PT (By licensed PT), OT (By licensed OT)     PT Frequency: 5/week OT Frequency: 5/week            Contractures Contractures Info: Not present    Additional Factors Info  Code Status, Allergies, Psychotropic Code Status Info: DNR Allergies Info: Codeine Psychotropic Info: Celexa         Current Medications (04/14/2016):  This is the current hospital active medication list Current Facility-Administered Medications  Medication Dose Route Frequency Provider Last Rate Last Dose  . acetaminophen (TYLENOL) tablet 650 mg  650 mg Oral Q6H PRN Gwenyth BenderKaren M Black, NP       Or  . acetaminophen (TYLENOL) suppository 650 mg  650 mg Rectal Q6H PRN Gwenyth BenderKaren M Black, NP      . acetaminophen (TYLENOL) tablet 650 mg  650 mg Oral BID Gwenyth BenderKaren M Black, NP   650 mg at 04/14/16 [id number]  . citalopram (CELEXA) tablet 10 mg  10 mg Oral Daily Gwenyth BenderKaren M Black, NP   10 mg at 04/14/16 0939  . enoxaparin (LOVENOX) injection 40 mg  40 mg Subcutaneous Q24H Gwenyth BenderKaren M Black, NP   40 mg at 04/13/16 2232  . guaifenesin (ROBITUSSIN) 100 MG/5ML syrup 200 mg  200 mg Oral  Q6H PRN Gwenyth BenderKaren M Black, NP      . ondansetron [facility](ZOFRAN) tablet 4 mg  4 mg Oral Q6H PRN Gwenyth BenderKaren M Black, NP       Or  . ondansetron [facility](ZOFRAN) injection 4 mg  4 mg Intravenous Q6H PRN Gwenyth BenderKaren M Black, NP      . senna-docusate (Senokot-S) tablet 1 tablet  1 tablet Oral BID Gwenyth BenderKaren M Black, NP   1 tablet at 04/14/16 [id number]  . sodium chloride flush (NS) 0.9 % injection 3 mL  3 mL Intravenous Q12H Gwenyth BenderKaren M Black, NP   3 mL at 04/14/16 0939  . vitamin B-12 (CYANOCOBALAMIN) tablet  1,000 mcg  1,000 mcg Oral Daily Gwenyth BenderKaren M Black, NP   1,000 mcg at 04/14/16 [id number]     Discharge Medications: Please see discharge summary for a list of discharge medications.  Relevant Imaging Results:  Relevant Lab Results:   Additional Information SSN: [id number]  Venita LickCampbell, Tyresse Jayson B, LCSW

## 2016-04-14 NOTE — Care Management Obs Status (Signed)
MEDICARE OBSERVATION STATUS NOTIFICATION   Patient Details  Name: [name] DandyWesley O Sidle MRN: [id number] Date of Birth: 05/04/1927   Medicare Observation Status Notification Given:  Yes    Gala LewandowskyGraves-Bigelow, [place] Kaye, RN 04/14/2016, 10:01 AM

## 2016-04-14 NOTE — Evaluation (Signed)
Physical Therapy Evaluation Patient Details Name: [name] [name] MRN: [id number] DOB: 07/08/1926 Today's Date: 04/14/2016   History of Present Illness  Patient is a 82 y/o male with hx of advanced dementia, HTN and anxiety presents after a syncopal episode and found to be hypothermic and bradycardic. Recently hospitalized on 10/27 for dehydration and UTI- appears he has never gotten back to his baseline and question progression of his cognitive decline may be his current baseline.  Clinical Impression  Patient presents with generalized weakness, fear of falling, impaired balance and overall mobility s/p above. Pt seems to be declining in function and cognition per previous MD and PT notes from admission 1 week ago. Tolerated SPT with assist of 2 for safety due to resisting therapists and fear of falling. Follows simple commands with increased time and cueing. Pt is not safe to return home at this time. Pt is a high fall risk and would benefit from short term SNF to maximize mobility and ease burden of care.     Follow Up Recommendations SNF ([facility])    Equipment Recommendations  None recommended by PT    Recommendations for Other Services       Precautions / Restrictions Precautions Precautions: Fall Restrictions Weight Bearing Restrictions: No      Mobility  Bed Mobility Overal bed mobility: Needs Assistance Bed Mobility: Supine to Sit     Supine to sit: Max assist;HOB elevated     General bed mobility comments: Assist for LEs and to elevate trunk. ABle to initiate movement of LEs but requires cues to complete movement.  Transfers Overall transfer level: Needs assistance Equipment used: 2 person hand held assist Transfers: Sit to/from UGI CorporationStand;Stand Pivot Transfers Sit to Stand: Max assist Stand pivot transfers: Mod assist;+2 physical assistance       General transfer comment: Assist to boost to standing with cues, pt resisting; "dont let me fall" SPT  bed to chair with assist of 2 for safety due to posterior lean and fear of falling.  Ambulation/Gait                Stairs            Wheelchair Mobility    Modified Rankin (Stroke Patients Only)       Balance Overall balance assessment: Needs assistance Sitting-balance support: Feet supported;No upper extremity supported Sitting balance-Leahy Scale: Fair Sitting balance - Comments: Posterior bias but pt able to sit EOB without UE support Postural control: Posterior lean Standing balance support: During functional activity Standing balance-Leahy Scale: Poor Standing balance comment: Needs assist of 2 for standing balance. posterior bias.                             Pertinent Vitals/Pain Pain Assessment: Faces Faces Pain Scale: No hurt    Home Living Family/patient expects to be discharged to:: Skilled nursing facility Living Arrangements: Spouse/significant other               Additional Comments: History from prior admission  week ago, pt living with wife lately, home from SNF due to financial reasons, but likely going back to memory care unit.    Prior Function           Comments: likely needing assist for all mobility, ADLS     Hand Dominance        Extremity/Trunk Assessment   Upper Extremity Assessment: Defer to OT evaluation;Generalized weakness  Lower Extremity Assessment: Generalized weakness (grossly ~3/5 throughout )      Cervical / Trunk Assessment: Kyphotic  Communication   Communication: HOH  Cognition Arousal/Alertness: Awake/alert Behavior During Therapy: WFL for tasks assessed/performed Overall Cognitive Status: History of cognitive impairments - at baseline                      General Comments General comments (skin integrity, edema, etc.): Unable to get any home or PLOF questions answered due to dementia.    Exercises     Assessment/Plan    PT Assessment Patient needs  continued PT services  PT Problem List Decreased strength;Decreased activity tolerance;Decreased mobility;Decreased coordination;Decreased cognition;Decreased balance          PT Treatment Interventions Gait training;Functional mobility training;Therapeutic activities;Therapeutic exercise;Balance training;Patient/family education    PT Goals (Current goals can be found in the Care Plan section)  Acute Rehab PT Goals Patient Stated Goal: unable to state PT Goal Formulation: Patient unable to participate in goal setting Time For Goal Achievement: 04/28/16 Potential to Achieve Goals: Fair    Frequency Min 2X/week   Barriers to discharge        Co-evaluation               End of Session Equipment Utilized During Treatment: Gait belt Activity Tolerance: Patient tolerated treatment well Patient left: in chair;with call bell/phone within reach;with chair alarm set Nurse Communication: Mobility status    Functional Assessment Tool Used: Clinical Judgement Functional Limitation: Mobility: Walking and moving around Mobility: Walking and Moving Around Current Status [phone]([id number]): At least 80 percent but less than 100 percent impaired, limited or restricted Mobility: Walking and Moving Around Goal Status [phone]([id number]): At least 40 percent but less than 60 percent impaired, limited or restricted    Time: 1415-1440 PT Time Calculation (min) (ACUTE ONLY): 25 min   Charges:   PT Evaluation $PT Eval Moderate Complexity: 1 Procedure PT Treatments $Therapeutic Activity: 8-22 mins   PT G Codes:   PT G-Codes **NOT FOR INPATIENT CLASS** Functional Assessment Tool Used: Clinical Judgement Functional Limitation: Mobility: Walking and moving around Mobility: Walking and Moving Around Current Status ([phone]): At least 80 percent but less than 100 percent impaired, limited or restricted Mobility: Walking and Moving Around Goal Status [phone]): At least 40 percent but less than 60 percent impaired,  limited or restricted    Willadean Guyton A Maziah Keeling 04/14/2016, 3:35 PM Mylo RedShauna Mushka Laconte, PT, DPT [phone]

## 2016-04-15 ENCOUNTER — Inpatient Hospital Stay (HOSPITAL_COMMUNITY): Payer: Medicare Other

## 2016-04-15 ENCOUNTER — Other Ambulatory Visit (HOSPITAL_COMMUNITY): Payer: Medicare Other

## 2016-04-15 DIAGNOSIS — Z515 Encounter for palliative care: Secondary | ICD-10-CM

## 2016-04-15 DIAGNOSIS — R55 Syncope and collapse: Secondary | ICD-10-CM

## 2016-04-15 LAB — BASIC METABOLIC PANEL
Anion gap: 5 (ref 5–15)
BUN: 12 mg/dL (ref 6–20)
CALCIUM: 8.9 mg/dL (ref 8.9–10.3)
CHLORIDE: 113 mmol/L — AB (ref 101–111)
CO2: 21 mmol/L — ABNORMAL LOW (ref 22–32)
CREATININE: 0.82 mg/dL (ref 0.61–1.24)
Glucose, Bld: 74 mg/dL (ref 65–99)
Potassium: 3.9 mmol/L (ref 3.5–5.1)
SODIUM: 139 mmol/L (ref 135–145)

## 2016-04-15 LAB — ECHOCARDIOGRAM LIMITED
Height: 69 in
WEIGHTICAEL: 2918.4 [oz_av]

## 2016-04-15 LAB — CBC
HCT: 40 % (ref 39.0–52.0)
Hemoglobin: 13.5 g/dL (ref 13.0–17.0)
MCH: 32.8 pg (ref 26.0–34.0)
MCHC: 33.8 g/dL (ref 30.0–36.0)
MCV: 97.1 fL (ref 78.0–100.0)
PLATELETS: 197 10*3/uL (ref 150–400)
RBC: 4.12 MIL/uL — AB (ref 4.22–5.81)
RDW: 14.1 % (ref 11.5–15.5)
WBC: 4.9 10*3/uL (ref 4.0–10.5)

## 2016-04-15 LAB — MAGNESIUM: MAGNESIUM: 2.3 mg/dL (ref 1.7–2.4)

## 2016-04-15 LAB — GLUCOSE, CAPILLARY: GLUCOSE-CAPILLARY: 67 mg/dL (ref 65–99)

## 2016-04-15 MED ORDER — PERFLUTREN LIPID MICROSPHERE
1.0000 mL | INTRAVENOUS | Status: AC | PRN
  Administered 2016-04-15: 2 mL via INTRAVENOUS
  Filled 2016-04-15: qty 10

## 2016-04-15 MED ORDER — DONEPEZIL HCL 10 MG PO TABS
10.0000 mg | ORAL_TABLET | Freq: Every day | ORAL | Status: DC
  Administered 2016-04-15 – 2016-04-16 (×2): 10 mg via ORAL
  Filled 2016-04-15 (×2): qty 1

## 2016-04-15 MED ORDER — PERFLUTREN LIPID MICROSPHERE
INTRAVENOUS | Status: AC
  Administered 2016-04-15: 12:00:00
  Filled 2016-04-15: qty 10

## 2016-04-15 NOTE — Progress Notes (Signed)
Subjective: Confused.    Objective: Vital signs in last 24 hours: Temp:  [98 F (36.7 C)-98.7 F (37.1 C)] 98 F (36.7 C) (11/04 0753) Pulse Rate:  [44-55] 53 (11/04 0753) Resp:  [16-19] 19 (11/04 0753) BP: (125-142)/(55-73) 128/62 (11/04 0753) SpO2:  [95 %-97 %] 96 % (11/04 0753) Weight:  [82.7 kg (182 lb 6.4 oz)] 82.7 kg (182 lb 6.4 oz) (11/04 0524)  Intake/Output from previous day: 11/03 0701 - 11/04 0700 In: 1904.7 [P.O.:720; I.V.:1184.7] Out: -  Intake/Output this shift: No intake/output data recorded. Nutritional status: Diet heart healthy/carb modified Room service appropriate? Yes; Fluid consistency: Thin  Neurologic Exam: Opens eyes to stimulation and talks. Very belligerent. Disoriented to year, month, date, day, place, president. Withdraws to pain in all 4 extremities.     Lab Results:  Recent Labs  04/14/16 0227 04/15/16 0458  WBC 6.1 4.9  HGB 12.5* 13.5  HCT 35.9* 40.0  PLT 208 197  NA 137 139  K 3.7 3.9  CL 111 113*  CO2 20* 21*  GLUCOSE 85 74  BUN 13 12  CREATININE 0.88 0.82  CALCIUM 8.7* 8.9   Lipid Panel No results for input(s): CHOL, TRIG, HDL, CHOLHDL, VLDL, LDLCALC in the last 72 hours.  Studies/Results: Ct Head Wo Contrast  Result Date: 04/13/2016 CLINICAL DATA:  Confusion EXAM: CT HEAD WITHOUT CONTRAST TECHNIQUE: Contiguous axial images were obtained from the base of the skull through the vertex without intravenous contrast. COMPARISON:  MRI 04/08/2016.  CT scan brain 04/07/2016 FINDINGS: Brain: No acute territorial infarction, intracranial hemorrhage or focal mass lesion. Moderate-to-marked global atrophy. Moderate periventricular white matter hypodensities consistent with small vessel disease, unchanged. Ventricular size and morphology is similar compared to prior exam. Stable lipoma of the corpus callosum. Vascular: No hyperdense vessels. Mild carotid artery calcifications at the skullbase. Skull: Mastoid air cells are clear.  No skull  fracture. Sinuses/Orbits: Mucous retention cysts within the maxillary sinuses. Mild mucosal thickening in the ethmoid sinuses. Mild bony remottling of the sphenoid sinuses unchanged. No acute orbital abnormality. Other: None IMPRESSION: 1. No CT evidence for acute intracranial abnormality 2. Other chronic changes as previously described. Electronically Signed   By: Jasmine PangKim  Fujinaga M.D.   On: 04/13/2016 18:04   Dg Chest Port 1 View  Result Date: 04/13/2016 CLINICAL DATA:  Bradycardia and hypotension.  Lethargy. EXAM: PORTABLE CHEST 1 VIEW COMPARISON:  04/07/2016.  04/01/2016. FINDINGS: Heart size is normal. Mediastinum appears wide, but this is presumed related to the AP portable technique. There is a poor inspiration with low lung volumes. Question early interstitial edema. No effusions. No lobar collapse. IMPRESSION: Portable AP supine film. Mediastinum appears prominent, probably related to the technique. Consider follow-up when able. Poor inspiration. Interstitial prominence that may relate to the poor inspiration or could represent early interstitial edema. Electronically Signed   By: Paulina FusiMark  Shogry M.D.   On: 04/13/2016 11:12    Medications:  Scheduled: . acetaminophen  650 mg Oral BID  . citalopram  10 mg Oral Daily  . donepezil  10 mg Oral QHS  . enoxaparin (LOVENOX) injection  40 mg Subcutaneous Q24H  . senna-docusate  1 tablet Oral BID  . sodium chloride flush  3 mL Intravenous Q12H  . vitamin B-12  1,000 mcg Oral Daily    Assessment/Plan:  Advanced Alzheimer's dementia.  I will initiate Acetylcholine esterase inhibitor, although may be too late in the neurodegenerative process to benefit.  Nevertheless, it might help some of his behavioral issues and ADLs.  LOS: 1 day   Weston SettleESHRAGHI, Kersti Scavone, MD 04/15/2016  9:07 AM

## 2016-04-15 NOTE — Progress Notes (Signed)
PROGRESS NOTE  [name] DandyWesley O Virtue  ZOX:[id number] DOB: 09/10/1926 DOA: 04/13/2016 PCP: Tillman Abideichard Letvak, MD   Brief Narrative: [name] [name] is a 82 y.o. male with a history of alzheimer's dementia progressing over 10 years, HTN, GERD, anemia, and falls presenting with syncope. Information from wife due to patient disorientation. She reports that he nearly fell while in the shower being bathed by an aide when he became unresponsive. He did not fall, but was sat upon a toilet. He had been discharged 3 days prior due to acute encephalopathy in the setting of UTI and has had generalized weakness and greater dependence for ADLs since. EMS was called and reported SBP in 80's and HR in 30's. On arrival hypothermia, bradycardia, and hypotension was concerning for sepsis. IV fluids, warming blanket were given and blood and urine cultures collected. CT of the head was negative for acute intracranial cause of worsening encephalopathy. Neurology was consulted and recommended no further work up of encephalopathy. Aricept has been started targeting behavioral issues.   Assessment & Plan: Principal Problem:   Syncope Active Problems:   Alzheimer's dementia without behavioral disturbance   Essential hypertension, benign   GERD   Acute encephalopathy   Bradycardia   Hypokalemia   Anemia   Hypothermia   Decreased oral intake   Dehydration  Acute encephalopathy on alzheimer's dementia, progressive and advances with behavioral disturbance: Patient getting difficult to manage at home. Difficult to find placement as last 2 hospitalization he was classified as observation - Neuro recommendations appreciated: starting aricept. - Palliative care consult - Continue inpatient due to dehydration requiring IV fluids and ongoing poor po intake. Also needs palliative care consultation and monitoring of symptomatic bradycardia. - PT evaluation - CM and CSW consultants appreciated  Syncope: Reportedly syncopized while  voiding. May be vagal response in the setting hypothermia. Epi gtt discontinued in the emergency department. Lactic acid within the limits of normal. Heart rate 56 blood pressure 113/63 no leukocytosis chest  x-ray without infiltrate - Obtain orthostatic vital signs - Continue IV fluids - Check echocardiogram  Bradycardia: Symptomatic. Not on any anti-arrhythmias. Initial troponin negative.  - Cycle troponins 0.06 flat trending. ECG without ischemia. - Follow up echocardiogram. - May need outpatient cardiology consult/Holter monitor. Not likely a PPM candidate.  Hypothermia: Rectal temp 95 on admission. No obvious sepsis. No leukocytosis. Chest x-ray without infiltrate no signs of infectious process. TSH and cortisol wnl.  - Continue IVF given poor po intake and dehydration.  Hypokalemia: Resolved s/p IV repletion. . Potassium 3.3 - Check in AM  Iron deficiency anemia: Chronic. Hemoglobin 11.6 on admission without signs of active bleeding. - Monitor - Outpatient follow-up  DVT prophylaxis: Lovenox Code Status: DNR Family Communication: None at bedside this AM Disposition Plan: Admit to inpatient, anticipate discharge to SNF pending PT evaluation and improvement in dehydration.  Consultants:   Palliative Care  Procedures:   Echo pending  Antimicrobials:  None   Subjective: Patient disoriented, denies pain or any other complaints.  Objective: Vitals:   04/14/16 2353 04/15/16 0524 04/15/16 0753 04/15/16 1247  BP: (!) 142/73 (!) 126/55 128/62 124/60  Pulse: (!) 44 (!) 55 (!) 53 (!) 51  Resp: 17 18 19 17   Temp: 98 F (36.7 C) 98.1 F (36.7 C) 98 F (36.7 C) 98.4 F (36.9 C)  TempSrc: Oral Oral Oral Oral  SpO2: 95% 95% 96% 97%  Weight:  82.7 kg (182 lb 6.4 oz)    Height:  Intake/Output Summary (Last 24 hours) at 04/15/16 1633 Last data filed at 04/15/16 0753  Gross per 24 hour  Intake          1544.67 ml  Output                0 ml  Net          1544.67  ml   Filed Weights   04/14/16 0200 04/15/16 0524  Weight: 98.4 kg (216 lb 14.9 oz) 82.7 kg (182 lb 6.4 oz)    Examination: General exam: 82 y.o. male in no distress Respiratory system: Non-labored breathing ambient air. Clear to auscultation bilaterally.  Cardiovascular system: Bradycardic. No murmur, rub, or gallop. No JVD, and no pedal edema. Gastrointestinal system: Abdomen soft, non-tender, non-distended, with normoactive bowel sounds. No organomegaly or masses felt. Central nervous system: Alert but disoriented x4. No focal neurological deficits. Extremities: Warm, no deformities Skin: No rashes, lesions or ulcers Psychiatry: Judgement and insight appear normal. Mood & affect appropriate.   Data Reviewed: I have personally reviewed following labs and imaging studies  CBC:  Recent Labs Lab 04/09/16 0538 04/13/16 1038 04/13/16 1119 04/14/16 0227 04/15/16 0458  WBC 4.9 5.8  --  6.1 4.9  NEUTROABS  --  4.3  --   --   --   HGB 13.0 12.6* 11.6* 12.5* 13.5  HCT 39.4 37.1* 34.0* 35.9* 40.0  MCV 100.3* 98.1  --  95.7 97.1  PLT 203 151  --  208 197   Basic Metabolic Panel:  Recent Labs Lab 04/09/16 0538 04/10/16 0634 04/13/16 1038 04/13/16 1119 04/14/16 0227 04/15/16 0458  NA 140 139 140 142 137 139  K 3.7 3.5 3.3* 3.3* 3.7 3.9  CL 112* 111 115* 111 111 113*  CO2 24 22 19*  --  20* 21*  GLUCOSE 94 90 129* 124* 85 74  BUN 13 13 15 17 13 12   CREATININE 0.92 0.88 0.91 0.90 0.88 0.82  CALCIUM 9.4 9.1 8.2*  --  8.7* 8.9  MG 2.4  --   --   --   --  2.3   GFR: Estimated Creatinine Clearance: 63.5 mL/min (by C-G formula based on SCr of 0.82 mg/dL). Liver Function Tests:  Recent Labs Lab 04/13/16 1038  AST 24  ALT 18  ALKPHOS 56  BILITOT 1.1  PROT 4.9*  ALBUMIN 3.1*   No results for input(s): LIPASE, AMYLASE in the last 168 hours. No results for input(s): AMMONIA in the last 168 hours. Coagulation Profile: No results for input(s): INR, PROTIME in the last 168  hours. Cardiac Enzymes:  Recent Labs Lab 04/13/16 1600 04/13/16 2143  TROPONINI 0.06* 0.06*   BNP (last 3 results) No results for input(s): PROBNP in the last 8760 hours. HbA1C: No results for input(s): HGBA1C in the last 72 hours. CBG:  Recent Labs Lab 04/15/16 0722  GLUCAP 67   Lipid Profile: No results for input(s): CHOL, HDL, LDLCALC, TRIG, CHOLHDL, LDLDIRECT in the last 72 hours. Thyroid Function Tests:  Recent Labs  04/13/16 1600  TSH 1.445   Anemia Panel: No results for input(s): VITAMINB12, FOLATE, FERRITIN, TIBC, IRON, RETICCTPCT in the last 72 hours. Urine analysis:    Component Value Date/Time   COLORURINE AMBER (A) 04/13/2016 1102   APPEARANCEUR CLOUDY (A) 04/13/2016 1102   LABSPEC 1.025 04/13/2016 1102   PHURINE 5.5 04/13/2016 1102   GLUCOSEU NEGATIVE 04/13/2016 1102   HGBUR NEGATIVE 04/13/2016 1102   BILIRUBINUR SMALL (A) 04/13/2016 1102   BILIRUBINUR  small 01/16/2013 1630   KETONESUR NEGATIVE 04/13/2016 1102   PROTEINUR 30 (A) 04/13/2016 1102   UROBILINOGEN negative 01/16/2013 1630   UROBILINOGEN 1.0 07/15/2011 1917   NITRITE NEGATIVE 04/13/2016 1102   LEUKOCYTESUR NEGATIVE 04/13/2016 1102   Sepsis Labs: @LABRCNTIP (procalcitonin:4,lacticidven:4)  ) Recent Results (from the past 240 hour(s))  Culture, Urine     Status: None   Collection Time: 04/13/16 11:02 AM  Result Value Ref Range Status   Specimen Description URINE, CATHETERIZED  Final   Special Requests NONE  Final   Culture NO GROWTH  Final   Report Status 04/14/2016 FINAL  Final  Culture, blood (routine x 2)     Status: None (Preliminary result)   Collection Time: 04/13/16 11:10 AM  Result Value Ref Range Status   Specimen Description BLOOD RIGHT HAND  Final   Special Requests BOTTLES DRAWN AEROBIC ONLY 5CC  Final   Culture NO GROWTH 2 DAYS  Final   Report Status PENDING  Incomplete  Culture, blood (routine x 2)     Status: None (Preliminary result)   Collection Time: 04/13/16  11:15 AM  Result Value Ref Range Status   Specimen Description BLOOD RIGHT ANTECUBITAL  Final   Special Requests BOTTLES DRAWN AEROBIC ONLY 5CC  Final   Culture NO GROWTH 2 DAYS  Final   Report Status PENDING  Incomplete  MRSA PCR Screening     Status: None   Collection Time: 04/13/16  3:09 PM  Result Value Ref Range Status   MRSA by PCR NEGATIVE NEGATIVE Final    Comment:        The GeneXpert MRSA Assay (FDA approved for NASAL specimens only), is one component of a comprehensive MRSA colonization surveillance program. It is not intended to diagnose MRSA infection nor to guide or monitor treatment for MRSA infections.      Radiology Studies: Ct Head Wo Contrast  Result Date: 04/13/2016 CLINICAL DATA:  Confusion EXAM: CT HEAD WITHOUT CONTRAST TECHNIQUE: Contiguous axial images were obtained from the base of the skull through the vertex without intravenous contrast. COMPARISON:  MRI 04/08/2016.  CT scan brain 04/07/2016 FINDINGS: Brain: No acute territorial infarction, intracranial hemorrhage or focal mass lesion. Moderate-to-marked global atrophy. Moderate periventricular white matter hypodensities consistent with small vessel disease, unchanged. Ventricular size and morphology is similar compared to prior exam. Stable lipoma of the corpus callosum. Vascular: No hyperdense vessels. Mild carotid artery calcifications at the skullbase. Skull: Mastoid air cells are clear.  No skull fracture. Sinuses/Orbits: Mucous retention cysts within the maxillary sinuses. Mild mucosal thickening in the ethmoid sinuses. Mild bony remottling of the sphenoid sinuses unchanged. No acute orbital abnormality. Other: None IMPRESSION: 1. No CT evidence for acute intracranial abnormality 2. Other chronic changes as previously described. Electronically Signed   By: Jasmine Pang M.D.   On: 04/13/2016 18:04    Scheduled Meds: . acetaminophen  650 mg Oral BID  . citalopram  10 mg Oral Daily  . donepezil  10 mg  Oral QHS  . enoxaparin (LOVENOX) injection  40 mg Subcutaneous Q24H  . perflutren lipid microspheres (DEFINITY) IV suspension      . senna-docusate  1 tablet Oral BID  . sodium chloride flush  3 mL Intravenous Q12H  . vitamin B-12  1,000 mcg Oral Daily   Continuous Infusions: . sodium chloride 100 mL/hr (04/15/16 0529)     LOS: 1 day   Time spent: 25 minutes.  Hazeline Junker, MD [facility] Pager [phone]  If 7PM-7AM, please  contact night-coverage [email or web address] Password [email or web address] 04/15/2016, 4:33 PM

## 2016-04-15 NOTE — Progress Notes (Signed)
  Echocardiogram 2D Echocardiogram has been performed.  [name] [name], [name] [name] 04/15/2016, 11:33 AM

## 2016-04-15 NOTE — Consult Note (Signed)
Consultation Note Date: 04/15/2016   Patient Name: [name] [name]  DOB: 1926/08/03  MRN: [id number]  Age / Sex: 82 y.o., male  PCP: Karie Schwalbe, MD Referring Physician: Tyrone Nine, MD  Reason for Consultation: Establishing goals of care, Hospice Evaluation and Psychosocial/spiritual support  HPI/Patient Profile: 82 y.o. male  with past medical history of Syncope, bradycardia, recurrent urinary tract infections, protein calorie malnutrition, GERD, hypertension, severe/end-stage dementia (diagnosed 10 years ago) admitted on 04/13/2016 with near syncope. This is patient's third hospitalization in less than 30 days. He was found to have another urinary tract infection which had been the case when he was admitted on 04/01/2016 through 04/04/2016; 04/07/2016 to 04/10/2016. He is very confused. He is unable to follow commands. Per his wife he has had a sharp functional decline since June. He now is a 2 person assist to get him up. He bears minimal weight. He stopped feeding himself a week ago and now has to be fed. His speech is becoming less clear, more nonsensical. He no longer recognizes his wife or his family..   Clinical Assessment and Goals of Care: Spoke to wife extensively regarding the disease trajectory of dementia specifically recurrent urinary tract infections from debility, high risk for aspiration pneumonia, as well as high risk for serious injury secondary to falls. Patient is bradycardic. Wife would not desire any further cardiology workup before discussions regarding a permanent pacemaker.  Patient can no longer speak for himself secondary to advanced dementia. His wife, Zayd Bonet, is his healthcare proxy    SUMMARY OF RECOMMENDATIONS   DO NOT RESUSCITATE DO NOT INTUBATE Goal is to continue to treat the treatable such as antibiotics, IV fluids for urinary tract infection as well as  dehydration When course of antibiotics are finished desire to go to skilled nursing facility for rehabilitation No treatments such as a permanent pacemaker for bradycardia. Code Status/Advance Care Planning:  DNR   Palliative Prophylaxis:   Aspiration, Bowel Regimen, Delirium Protocol, Frequent Pain Assessment, Oral Care and Turn Reposition  Additional Recommendations (Limitations, Scope, Preferences):  Avoid Hospitalization, Minimize Medications, Initiate Comfort Feeding, No Artificial Feeding, No Blood Transfusions, No Chemotherapy, No Hemodialysis, No Radiation, No Surgical Procedures and No Tracheostomy  Psycho-social/Spiritual:   Desire for further Chaplaincy support:no  Additional Recommendations: Grief/Bereavement Support  Prognosis:   < 6 months in the setting of advanced dementia, recurrent urinary tract infections with this being his third hospitalization in less than 30 days; protein calorie malnutrition with an albumin of 3.1. Progressive functional decline since June 2017: Now no longer able to feed himself or walk or follow commands  Discharge Planning: Skilled Nursing Facility for rehab with Palliative care service follow-up      Primary Diagnoses: Present on Admission: . Bradycardia . Essential hypertension, benign . GERD . Syncope . Alzheimer's dementia without behavioral disturbance . Hypokalemia . Anemia . Hypothermia . Acute encephalopathy . Decreased oral intake . Dehydration   I have reviewed the medical record, interviewed the patient and family, and examined the  patient. The following aspects are pertinent.  Past Medical History:  Diagnosis Date  . Alzheimer disease   . ALZHEIMERS DISEASE 09/25/2008  . Anxiety   . ARTHRITIS 09/19/2006  . B12 DEFICIENCY 11/23/2008  . DIVERTICULOSIS, COLON, HX OF 09/19/2006  . GERD (gastroesophageal reflux disease)   . Hypertension   . HYPERTENSION 09/19/2006  . Hypokalemia 04/13/2016  . Syncope 04/13/2016  .  VENOUS INSUFFICIENCY 09/19/2006   Social History   Social History  . Marital status: Married    Spouse name: N/A  . Number of children: 2  . Years of education: N/A   Occupational History  . grading work    Social History Main Topics  . Smoking status: Former Smoker    Years: 50.00    Types: Cigarettes  . Smokeless tobacco: Never Used  . Alcohol use No  . Drug use: No  . Sexual activity: No   Other Topics Concern  . None   Social History Narrative   Wife is health care POA   DNR order done 06/18/14   No prolonged ventilation and no tube feeds   Family History  Problem Relation Age of Onset  . Asthma Mother   . Alzheimer's disease Mother   . Kidney disease Father   . COPD Brother   . Alzheimer's disease Brother   . Arthritis Brother    Scheduled Meds: . acetaminophen  650 mg Oral BID  . citalopram  10 mg Oral Daily  . donepezil  10 mg Oral QHS  . enoxaparin (LOVENOX) injection  40 mg Subcutaneous Q24H  . perflutren lipid microspheres (DEFINITY) IV suspension      . senna-docusate  1 tablet Oral BID  . sodium chloride flush  3 mL Intravenous Q12H  . vitamin B-12  1,000 mcg Oral Daily   Continuous Infusions: . sodium chloride 100 mL/hr (04/15/16 0529)   PRN Meds:.acetaminophen **OR** acetaminophen, guaifenesin, ondansetron **OR** ondansetron (ZOFRAN) IV Medications Prior to Admission:  Prior to Admission medications   Medication Sig Start Date End Date Taking? Authorizing Provider  acetaminophen (MAPAP ARTHRITIS PAIN) 650 MG CR tablet TAKE ONE TABLET BY MOUTH 3 TIMES A DAY *DO NOT CRUSH* Patient taking differently: Take 650 mg by mouth 2 (two) times daily.  12/13/15  Yes Karie Schwalbeichard I Letvak, MD  citalopram (CELEXA) 10 MG tablet Take 1 tablet (10 mg total) by mouth daily. 01/04/16  Yes Karie Schwalbeichard I Letvak, MD  guaifenesin (ROBITUSSIN) 100 MG/5ML syrup Take 200 mg by mouth every 6 (six) hours as needed for cough.   Yes Historical Provider, MD  polyethylene glycol (MIRALAX /  GLYCOLAX) packet Take 17 g by mouth daily. 04/11/16  Yes Rolly SalterPranav M Patel, MD  senna-docusate (SENOKOT-S) 8.6-50 MG tablet Take 1 tablet by mouth 2 (two) times daily. 04/10/16  Yes Rolly SalterPranav M Patel, MD  vitamin B-12 (CYANOCOBALAMIN) 1000 MCG tablet Take 1 tablet (1,000 mcg total) by mouth daily. 12/13/15  Yes Karie Schwalbeichard I Letvak, MD   Allergies  Allergen Reactions  . Codeine Nausea And Vomiting   Review of Systems  Unable to perform ROS: Dementia    Physical Exam  Vital Signs: BP 129/69   Pulse (!) 59   Temp 98 F (36.7 C) (Oral)   Resp 18   Ht 5\' 9"  (1.753 m)   Wt 82.7 kg (182 lb 6.4 oz)   SpO2 98%   BMI 26.94 kg/m  Pain Assessment: No/denies pain   Pain Score: Asleep   SpO2: SpO2: 98 % O2 Device:SpO2: 98 %  O2 Flow Rate: .   IO: Intake/output summary:  Intake/Output Summary (Last 24 hours) at 04/15/16 1722 Last data filed at 04/15/16 0753  Gross per 24 hour  Intake          1544.67 ml  Output                0 ml  Net          1544.67 ml    LBM:   Baseline Weight: Weight: 98.4 kg (216 lb 14.9 oz) Most recent weight: Weight: 82.7 kg (182 lb 6.4 oz)     Palliative Assessment/Data:   Flowsheet Rows   Flowsheet Row Most Recent Value  Intake Tab  Referral Department  Hospitalist  Unit at Time of Referral  Med/Surg Unit  Palliative Care Primary Diagnosis  Cardiac  Date Notified  04/14/16  Palliative Care Type  New Palliative care  Reason for referral  Clarify Goals of Care, Counsel Regarding Hospice  Date of Admission  04/13/16  Date first seen by Palliative Care  04/15/16  # of days Palliative referral response time  1 Day(s)  # of days IP prior to Palliative referral  1  Clinical Assessment  Palliative Performance Scale Score  30%  Pain Max last 24 hours  Not able to report  Pain Min Last 24 hours  Not able to report  Dyspnea Max Last 24 Hours  Not able to report  Dyspnea Min Last 24 hours  Not able to report  Nausea Max Last 24 Hours  Not able to report  Nausea  Min Last 24 Hours  Not able to report  Anxiety Max Last 24 Hours  Not able to report  Anxiety Min Last 24 Hours  Not able to report  Other Max Last 24 Hours  Not able to report  Psychosocial & Spiritual Assessment  Palliative Care Outcomes  Patient/Family meeting held?  Yes  Who was at the meeting?  wife  Palliative Care Outcomes  Clarified goals of care, Counseled regarding hospice, Provided psychosocial or spiritual support, Provided end of life care assistance  Patient/Family wishes: Interventions discontinued/not started   Mechanical Ventilation, BiPAP, Tube feedings/TPN, Hemodialysis, NIPPV, Trach, Vasopressors, PEG, Other (Comment)  Palliative Care follow-up planned  Yes, Facility      Time In: 1600 Time Out: 1710 Time Total: 70 min Greater than 50%  of this time was spent counseling and coordinating care related to the above assessment and plan. Staffed with Dr. Jarvis NewcomerGrunz  Signed by: Irean HongSarah Grace Davy Faught, NP   Please contact Palliative Medicine Team phone at [phone] for questions and concerns.  For individual provider: See Loretha StaplerAmion

## 2016-04-15 NOTE — Progress Notes (Signed)
  Echocardiogram 2D Echocardiogram has been performed.  Arvil ChacoFoster, Saifan Rayford 04/15/2016, 11:36 AM

## 2016-04-16 DIAGNOSIS — R638 Other symptoms and signs concerning food and fluid intake: Secondary | ICD-10-CM

## 2016-04-16 LAB — GLUCOSE, CAPILLARY: GLUCOSE-CAPILLARY: 74 mg/dL (ref 65–99)

## 2016-04-16 MED ORDER — MEMANTINE HCL 5 MG PO TABS
10.0000 mg | ORAL_TABLET | Freq: Two times a day (BID) | ORAL | Status: DC
  Administered 2016-04-16 – 2016-04-17 (×3): 10 mg via ORAL
  Filled 2016-04-16 (×3): qty 2

## 2016-04-16 NOTE — Progress Notes (Signed)
Subjective: Tolerating Aricept without diarrhea  Objective: Vital signs in last 24 hours: Temp:  [97.7 F (36.5 C)-98.4 F (36.9 C)] 98.1 F (36.7 C) (11/05 0439) Pulse Rate:  [51-63] 63 (11/05 0439) Resp:  [15-20] 15 (11/05 0439) BP: (110-129)/(60-69) 113/60 (11/05 0439) SpO2:  [97 %-100 %] 100 % (11/05 0001) Weight:  [84.2 kg (185 lb 9.6 oz)] 84.2 kg (185 lb 9.6 oz) (11/05 0439)  Intake/Output from previous day: 11/04 0701 - 11/05 0700 In: 300 [P.O.:300] Out: -  Intake/Output this shift: No intake/output data recorded. Nutritional status: Diet heart healthy/carb modified Room service appropriate? Yes; Fluid consistency: Thin  Neurologic Exam: Awake, alert, completely disoriented. No focal deficits  Lab Results:  Recent Labs  04/14/16 0227 04/15/16 0458  WBC 6.1 4.9  HGB 12.5* 13.5  HCT 35.9* 40.0  PLT 208 197  NA 137 139  K 3.7 3.9  CL 111 113*  CO2 20* 21*  GLUCOSE 85 74  BUN 13 12  CREATININE 0.88 0.82  CALCIUM 8.7* 8.9   Lipid Panel No results for input(s): CHOL, TRIG, HDL, CHOLHDL, VLDL, LDLCALC in the last 72 hours.  Studies/Results: No results found.  Medications:  Scheduled: . acetaminophen  650 mg Oral BID  . citalopram  10 mg Oral Daily  . donepezil  10 mg Oral QHS  . enoxaparin (LOVENOX) injection  40 mg Subcutaneous Q24H  . senna-docusate  1 tablet Oral BID  . sodium chloride flush  3 mL Intravenous Q12H  . vitamin B-12  1,000 mcg Oral Daily    Assessment/Plan:  Alzheimer's dementia- moderate to early severe stage.  I will add  Namenda to the Aricept.    LOS: 2 days   Weston SettleESHRAGHI, Louie Flenner, MD 04/16/2016  8:21 AM

## 2016-04-16 NOTE — Progress Notes (Signed)
PROGRESS NOTE  [name] [name]  WUJ:[id number] DOB: 04-Apr-1927 DOA: 04/13/2016 PCP: Tillman Abide, MD   Brief Narrative: [name] [name] is a 82 y.o. male with a history of alzheimer's dementia progressing over 10 years, HTN, GERD, anemia, and falls presenting with syncope. Information from wife due to patient disorientation. She reports that he nearly fell while in the shower being bathed by an aide when he became unresponsive. He did not fall, but was sat upon a toilet. He had been discharged 3 days prior due to acute encephalopathy in the setting of UTI and has had generalized weakness and greater dependence for ADLs since. EMS was called and reported SBP in 80's and HR in 30's. On arrival hypothermia, bradycardia, and hypotension was concerning for sepsis. IV fluids, warming blanket were given and blood and urine cultures collected. CT of the head was negative for acute intracranial cause of worsening encephalopathy. Neurology was consulted and recommended no further work up of encephalopathy. Aricept has been started targeting behavioral issues.   Assessment & Plan: Principal Problem:   Syncope Active Problems:   Alzheimer's dementia without behavioral disturbance   Essential hypertension, benign   GERD   Acute encephalopathy   Bradycardia   Hypokalemia   Anemia   Hypothermia   Decreased oral intake   Dehydration   Palliative care encounter  Acute encephalopathy on alzheimer's dementia, progressive and advances with behavioral disturbance: Patient getting difficult to manage at home. Difficult to find placement as last 2 hospitalization he was classified as observation - Neuro recommendations appreciated: started aricept 11/4, tolerating without diarrhea; added namenda 11/5. - Palliative care consult appreciated - Continue inpatient due to dehydration requiring IV fluids and ongoing poor po intake. Very high fall risk due to cognition impairment and symptomatic bradycardia. - PT  evaluation: recommending SNF - CM and CSW consultants appreciated  Syncope: Suspect symptomatic bradycardia. Lactic acid within the limits of normal. Heart rate 56 blood pressure 113/63 no leukocytosis chest  x-ray without infiltrate - Continue IV fluids - Fall precautions.   Bradycardia: Symptomatic. Not on any anti-arrhythmics. - Cycle troponins 0.06 flat trending. ECG without ischemia. - Echocardiogram very limited study due to pt's inability to cooperate with exam. LV systolic function "probably preserved," and no other findings could be gleaned. - Not a PPM candidate.  Hypothermia: Resolved. Rectal temp 95 on admission. No obvious sepsis. No leukocytosis. Chest x-ray without infiltrate no signs of infectious process. TSH and cortisol wnl.  - Continue IVF given poor po intake and dehydration.  Hypokalemia: Resolved s/p IV repletion. Potassium 3.3 - Check in AM  Iron deficiency anemia: Chronic. Hemoglobin 11.6 on admission without signs of active bleeding. - Monitor - Outpatient follow-up  DVT prophylaxis: Lovenox Code Status: DNR Family Communication: None at bedside this AM Disposition Plan: Remain inpatient for IV fluid hydration in setting of dehydration and symptomatic bradycardia. Anticipate D/C to SNF with palliative care services in 24-48 hrs.  Consultants:   Palliative Care  Neurology  Procedures:   Echo: limited study due to inability to cooperate with exam. LV systolic function "probably preserved."   Antimicrobials:  None   Subjective: Patient disoriented, denies pain or any other complaints. Does not recognize anyone including wife of 60+ years.   Objective: Vitals:   04/15/16 1948 04/16/16 0001 04/16/16 0439 04/16/16 0849  BP: 124/68 110/61 113/60 117/67  Pulse: (!) 51 (!) 58 63 (!) 50  Resp: 17 20 15    Temp: 97.8 F (36.6 C) 97.7 F (36.5 C)  98.1 F (36.7 C) 97.6 F (36.4 C)  TempSrc: Axillary  Oral Oral  SpO2: 100% 100%  100%  Weight:    84.2 kg (185 lb 9.6 oz)   Height:        Intake/Output Summary (Last 24 hours) at 04/16/16 0851 Last data filed at 04/15/16 1700  Gross per 24 hour  Intake              180 ml  Output                0 ml  Net              180 ml   Filed Weights   04/14/16 0200 04/15/16 0524 04/16/16 0439  Weight: 98.4 kg (216 lb 14.9 oz) 82.7 kg (182 lb 6.4 oz) 84.2 kg (185 lb 9.6 oz)    Examination: General exam: 82 y.o. male in no distress Respiratory system: Non-labored breathing ambient air. Clear to auscultation bilaterally.  Cardiovascular system: Bradycardic, regular. No murmur, rub, or gallop. No JVD, and no pedal edema. Gastrointestinal system: Abdomen soft, non-tender, non-distended, with normoactive bowel sounds. No organomegaly or masses felt. Central nervous system: Alert but disoriented x4. No focal neurological deficits. Extremities: Warm, no deformities. Posey mittens bilaterally with normal cap refill in digits when removed temporarily. Skin: No rashes, lesions or ulcers Psychiatry: Unable to determine due to confusion.  Data Reviewed: I have personally reviewed following labs and imaging studies  CBC:  Recent Labs Lab 04/13/16 1038 04/13/16 1119 04/14/16 0227 04/15/16 0458  WBC 5.8  --  6.1 4.9  NEUTROABS 4.3  --   --   --   HGB 12.6* 11.6* 12.5* 13.5  HCT 37.1* 34.0* 35.9* 40.0  MCV 98.1  --  95.7 97.1  PLT 151  --  208 197   Basic Metabolic Panel:  Recent Labs Lab 04/10/16 0634 04/13/16 1038 04/13/16 1119 04/14/16 0227 04/15/16 0458  NA 139 140 142 137 139  K 3.5 3.3* 3.3* 3.7 3.9  CL 111 115* 111 111 113*  CO2 22 19*  --  20* 21*  GLUCOSE 90 129* 124* 85 74  BUN 13 15 17 13 12   CREATININE 0.88 0.91 0.90 0.88 0.82  CALCIUM 9.1 8.2*  --  8.7* 8.9  MG  --   --   --   --  2.3   GFR: Estimated Creatinine Clearance: 63.5 mL/min (by C-G formula based on SCr of 0.82 mg/dL). Liver Function Tests:  Recent Labs Lab 04/13/16 1038  AST 24  ALT 18  ALKPHOS  56  BILITOT 1.1  PROT 4.9*  ALBUMIN 3.1*   No results for input(s): LIPASE, AMYLASE in the last 168 hours. No results for input(s): AMMONIA in the last 168 hours. Coagulation Profile: No results for input(s): INR, PROTIME in the last 168 hours. Cardiac Enzymes:  Recent Labs Lab 04/13/16 1600 04/13/16 2143  TROPONINI 0.06* 0.06*   BNP (last 3 results) No results for input(s): PROBNP in the last 8760 hours. HbA1C: No results for input(s): HGBA1C in the last 72 hours. CBG:  Recent Labs Lab 04/15/16 0722 04/16/16 0440  GLUCAP 67 74   Lipid Profile: No results for input(s): CHOL, HDL, LDLCALC, TRIG, CHOLHDL, LDLDIRECT in the last 72 hours. Thyroid Function Tests:  Recent Labs  04/13/16 1600  TSH 1.445   Anemia Panel: No results for input(s): VITAMINB12, FOLATE, FERRITIN, TIBC, IRON, RETICCTPCT in the last 72 hours. Urine analysis:    Component Value Date/Time  COLORURINE AMBER (A) 04/13/2016 1102   APPEARANCEUR CLOUDY (A) 04/13/2016 1102   LABSPEC 1.025 04/13/2016 1102   PHURINE 5.5 04/13/2016 1102   GLUCOSEU NEGATIVE 04/13/2016 1102   HGBUR NEGATIVE 04/13/2016 1102   BILIRUBINUR SMALL (A) 04/13/2016 1102   BILIRUBINUR small 01/16/2013 1630   KETONESUR NEGATIVE 04/13/2016 1102   PROTEINUR 30 (A) 04/13/2016 1102   UROBILINOGEN negative 01/16/2013 1630   UROBILINOGEN 1.0 07/15/2011 1917   NITRITE NEGATIVE 04/13/2016 1102   LEUKOCYTESUR NEGATIVE 04/13/2016 1102   Sepsis Labs: @LABRCNTIP (procalcitonin:4,lacticidven:4)  ) Recent Results (from the past 240 hour(s))  Culture, Urine     Status: None   Collection Time: 04/13/16 11:02 AM  Result Value Ref Range Status   Specimen Description URINE, CATHETERIZED  Final   Special Requests NONE  Final   Culture NO GROWTH  Final   Report Status 04/14/2016 FINAL  Final  Culture, blood (routine x 2)     Status: None (Preliminary result)   Collection Time: 04/13/16 11:10 AM  Result Value Ref Range Status   Specimen  Description BLOOD RIGHT HAND  Final   Special Requests BOTTLES DRAWN AEROBIC ONLY 5CC  Final   Culture NO GROWTH 2 DAYS  Final   Report Status PENDING  Incomplete  Culture, blood (routine x 2)     Status: None (Preliminary result)   Collection Time: 04/13/16 11:15 AM  Result Value Ref Range Status   Specimen Description BLOOD RIGHT ANTECUBITAL  Final   Special Requests BOTTLES DRAWN AEROBIC ONLY 5CC  Final   Culture NO GROWTH 2 DAYS  Final   Report Status PENDING  Incomplete  MRSA PCR Screening     Status: None   Collection Time: 04/13/16  3:09 PM  Result Value Ref Range Status   MRSA by PCR NEGATIVE NEGATIVE Final    Comment:        The GeneXpert MRSA Assay (FDA approved for NASAL specimens only), is one component of a comprehensive MRSA colonization surveillance program. It is not intended to diagnose MRSA infection nor to guide or monitor treatment for MRSA infections.      Radiology Studies: No results found.  Scheduled Meds: . acetaminophen  650 mg Oral BID  . citalopram  10 mg Oral Daily  . donepezil  10 mg Oral QHS  . enoxaparin (LOVENOX) injection  40 mg Subcutaneous Q24H  . memantine  10 mg Oral BID  . senna-docusate  1 tablet Oral BID  . sodium chloride flush  3 mL Intravenous Q12H  . vitamin B-12  1,000 mcg Oral Daily   Continuous Infusions: . sodium chloride 100 mL/hr (04/16/16 0050)     LOS: 2 days   Time spent: 25 minutes.  Hazeline Junkeryan Sundy Houchins, MD [facility] Pager [phone]  If 7PM-7AM, please contact night-coverage [email or web address] Password [email or web address] 04/16/2016, 8:51 AM

## 2016-04-16 NOTE — Plan of Care (Signed)
Problem: Nutrition Goal: Patient maintains adequate hydration Outcome: Progressing Continue IVF, Assist with Feeding

## 2016-04-16 NOTE — Progress Notes (Addendum)
CSW spoke with patients spouse, Gigi Gineggy, regarding discharge plans. Patients spouse would like to wait until Monday to see if there are more beds available however if not she prefers [facility].   Patients daughter, Rosanne SackKasey, would like to be update: [phone]  Stacy GardnerErin Kayton Ripp, [facility] Physicians' HospitalCSWA Clinical Social Worker [phone]

## 2016-04-16 NOTE — Plan of Care (Signed)
Problem: Skin Integrity: Goal: Risk for impaired skin integrity will decrease Outcome: Progressing Condom Catheter for pt with Incontinence

## 2016-04-17 DIAGNOSIS — F329 Major depressive disorder, single episode, unspecified: Secondary | ICD-10-CM | POA: Diagnosis not present

## 2016-04-17 DIAGNOSIS — G9340 Encephalopathy, unspecified: Secondary | ICD-10-CM

## 2016-04-17 DIAGNOSIS — F419 Anxiety disorder, unspecified: Secondary | ICD-10-CM | POA: Diagnosis not present

## 2016-04-17 DIAGNOSIS — G308 Other Alzheimer's disease: Secondary | ICD-10-CM | POA: Diagnosis not present

## 2016-04-17 DIAGNOSIS — R55 Syncope and collapse: Secondary | ICD-10-CM | POA: Diagnosis not present

## 2016-04-17 DIAGNOSIS — F3289 Other specified depressive episodes: Secondary | ICD-10-CM | POA: Diagnosis not present

## 2016-04-17 DIAGNOSIS — D518 Other vitamin B12 deficiency anemias: Secondary | ICD-10-CM | POA: Diagnosis not present

## 2016-04-17 DIAGNOSIS — M159 Polyosteoarthritis, unspecified: Secondary | ICD-10-CM | POA: Diagnosis not present

## 2016-04-17 DIAGNOSIS — R638 Other symptoms and signs concerning food and fluid intake: Secondary | ICD-10-CM | POA: Diagnosis not present

## 2016-04-17 DIAGNOSIS — G933 Postviral fatigue syndrome: Secondary | ICD-10-CM | POA: Diagnosis not present

## 2016-04-17 DIAGNOSIS — R296 Repeated falls: Secondary | ICD-10-CM | POA: Diagnosis not present

## 2016-04-17 DIAGNOSIS — K5900 Constipation, unspecified: Secondary | ICD-10-CM | POA: Diagnosis not present

## 2016-04-17 DIAGNOSIS — I499 Cardiac arrhythmia, unspecified: Secondary | ICD-10-CM | POA: Diagnosis not present

## 2016-04-17 DIAGNOSIS — M1990 Unspecified osteoarthritis, unspecified site: Secondary | ICD-10-CM | POA: Diagnosis not present

## 2016-04-17 DIAGNOSIS — Z5189 Encounter for other specified aftercare: Secondary | ICD-10-CM | POA: Diagnosis not present

## 2016-04-17 DIAGNOSIS — M6281 Muscle weakness (generalized): Secondary | ICD-10-CM | POA: Diagnosis not present

## 2016-04-17 DIAGNOSIS — R001 Bradycardia, unspecified: Secondary | ICD-10-CM | POA: Diagnosis not present

## 2016-04-17 DIAGNOSIS — F0391 Unspecified dementia with behavioral disturbance: Secondary | ICD-10-CM | POA: Diagnosis not present

## 2016-04-17 DIAGNOSIS — F0390 Unspecified dementia without behavioral disturbance: Secondary | ICD-10-CM | POA: Diagnosis not present

## 2016-04-17 DIAGNOSIS — N390 Urinary tract infection, site not specified: Secondary | ICD-10-CM | POA: Diagnosis not present

## 2016-04-17 LAB — BASIC METABOLIC PANEL
ANION GAP: 8 (ref 5–15)
BUN: 10 mg/dL (ref 6–20)
CALCIUM: 9.1 mg/dL (ref 8.9–10.3)
CO2: 20 mmol/L — AB (ref 22–32)
CREATININE: 0.86 mg/dL (ref 0.61–1.24)
Chloride: 112 mmol/L — ABNORMAL HIGH (ref 101–111)
Glucose, Bld: 73 mg/dL (ref 65–99)
Potassium: 3.4 mmol/L — ABNORMAL LOW (ref 3.5–5.1)
SODIUM: 140 mmol/L (ref 135–145)

## 2016-04-17 LAB — GLUCOSE, CAPILLARY: GLUCOSE-CAPILLARY: 121 mg/dL — AB (ref 65–99)

## 2016-04-17 MED ORDER — MEMANTINE HCL 10 MG PO TABS: 10.0000 mg | ORAL_TABLET | Freq: Two times a day (BID) | ORAL | Status: AC

## 2016-04-17 NOTE — Progress Notes (Signed)
Report given to the receiving nurse at Nursing home facility. GrenadaBrittany LPN. All the questions where answered. Pt scheduled to be transported at 0130PM.  Colleen Canesar Breken Nazari, RN

## 2016-04-17 NOTE — Discharge Summary (Signed)
Physician Discharge Summary  GIANN OBARA ZOX:[id number] DOB: Mar 22, 1927 DOA: 04/13/2016  PCP: Tillman Abide, MD  Admit date: 04/13/2016 Discharge date: 04/17/2016  Admitted From: Home Disposition: [facility]   Recommendations for Outpatient Follow-up:  1. Follow up with palliative care 2. Monitor behavioral disturbance, namenda started 11/4 to target this.  3. Monitor bradycardia, aricept started and stopped in hospital due to bradycardia.   Home Health: Palliative care, and per SNF Equipment/Devices: Per SNF  Discharge Condition: Stable CODE STATUS: DNR Diet recommendation: Regular  Brief/Interim Summary: Rodney Yera Whitakeris a 82 y.o.malewith a history of alzheimer's dementia progressing over 10 years, HTN, GERD, anemia, and falls presenting with syncope. Information from wife due to patient disorientation. She reports that he nearly fell while in the shower being bathed by an aide when he became unresponsive. He did not fall, but was sat upon a toilet. He had been discharged 3 days prior due to acute encephalopathy in the setting of UTI and has had generalized weakness and greater dependence for ADLs since. EMS was called and reported SBP in 80's and HR in 30's. On arrival hypothermia, bradycardia, and hypotension was concerning for sepsis. IV fluids, warming blanket were given and blood and urine cultures collected. CT of the head was negative for acute intracranial cause of worsening encephalopathy. Neurology was consulted and recommended no further work up of encephalopathy, which is thought to be his new baseline due to progression of dementia. Aricept was started targeting behavioral issues but bradycardia was worsened, so this was stopped. Namenda was also started hoping to improve behavioral disturbances. He is discharged in stable condition to SNF.  Discharge Diagnoses:  Principal Problem:   Syncope Active Problems:   Alzheimer's dementia without behavioral  disturbance   Essential hypertension, benign   GERD   Acute encephalopathy   Bradycardia   Hypokalemia   Anemia   Hypothermia   Decreased oral intake   Dehydration   Palliative care encounter  Discharge Instructions Discharge Instructions    Discharge instructions    Complete by:  As directed    See discharge summary       Medication List    TAKE these medications   acetaminophen 650 MG CR tablet Commonly known as:  MAPAP ARTHRITIS PAIN TAKE ONE TABLET BY MOUTH 3 TIMES A DAY *DO NOT CRUSH* What changed:  how much to take  how to take this  when to take this  additional instructions   citalopram 10 MG tablet Commonly known as:  CELEXA Take 1 tablet (10 mg total) by mouth daily.   guaifenesin 100 MG/5ML syrup Commonly known as:  ROBITUSSIN Take 200 mg by mouth every 6 (six) hours as needed for cough.   memantine 10 MG tablet Commonly known as:  NAMENDA Take 1 tablet (10 mg total) by mouth 2 (two) times daily.   polyethylene glycol packet Commonly known as:  MIRALAX / GLYCOLAX Take 17 g by mouth daily.   senna-docusate 8.6-50 MG tablet Commonly known as:  Senokot-S Take 1 tablet by mouth 2 (two) times daily.   vitamin B-12 1000 MCG tablet Commonly known as:  CYANOCOBALAMIN Take 1 tablet (1,000 mcg total) by mouth daily.       Allergies  Allergen Reactions  . Codeine Nausea And Vomiting    Consultations:  Palliative care team  Procedures/Studies: Dg Chest 2 View  Result Date: 04/07/2016 CLINICAL DATA:  Fall. EXAM: CHEST  2 VIEW COMPARISON:  04/01/2016 FINDINGS: Mediastinum and hilar structures are stable.  Low lung volumes. Interposition of the colon under the hemidiaphragms again noted. Stable mild cardiomegaly. No pulmonary venous congestion. No pleural effusion or pneumothorax. No acute bony abnormality. IMPRESSION: 1. Stable mild cardiomegaly. 2. Low lung volumes. Interposition of the colon hemidiaphragms again noted. No acute abnormality  identified. Electronically Signed   By: Maisie Fushomas  Register   On: 04/07/2016 17:30   Ct Head Wo Contrast  Result Date: 04/13/2016 CLINICAL DATA:  Confusion EXAM: CT HEAD WITHOUT CONTRAST TECHNIQUE: Contiguous axial images were obtained from the base of the skull through the vertex without intravenous contrast. COMPARISON:  MRI 04/08/2016.  CT scan brain 04/07/2016 FINDINGS: Brain: No acute territorial infarction, intracranial hemorrhage or focal mass lesion. Moderate-to-marked global atrophy. Moderate periventricular white matter hypodensities consistent with small vessel disease, unchanged. Ventricular size and morphology is similar compared to prior exam. Stable lipoma of the corpus callosum. Vascular: No hyperdense vessels. Mild carotid artery calcifications at the skullbase. Skull: Mastoid air cells are clear.  No skull fracture. Sinuses/Orbits: Mucous retention cysts within the maxillary sinuses. Mild mucosal thickening in the ethmoid sinuses. Mild bony remottling of the sphenoid sinuses unchanged. No acute orbital abnormality. Other: None IMPRESSION: 1. No CT evidence for acute intracranial abnormality 2. Other chronic changes as previously described. Electronically Signed   By: Jasmine PangKim  Fujinaga M.D.   On: 04/13/2016 18:04   Ct Head Wo Contrast  Result Date: 04/07/2016 CLINICAL DATA:  Confusion, multiple falls.  Unsteady gait EXAM: CT HEAD WITHOUT CONTRAST TECHNIQUE: Contiguous axial images were obtained from the base of the skull through the vertex without intravenous contrast. COMPARISON:  04/01/2016 FINDINGS: Brain: No acute territorial infarction or intracranial hemorrhage. No new mass, mass effect or midline shift. Moderate atrophy. Moderate-to-marked periventricular white matter small vessel ischemic disease. 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 Stable slightly enlarged ventricles likely related to atrophy. Vascular: No hyperdense vessels. Calcifications present within the  carotid arteries at the skullbase. Skull: Mastoid air cells clear. There is no skull fracture. Mild bony thickening of the walls of the sphenoid sinus, also unchanged. Sinuses/Orbits: Moderate mucosal thickening in the ethmoid and maxillary sinuses with polyp or mucous retention cyst in the right maxillary sinus. No acute orbital abnormality. Other: None IMPRESSION: 1. No CT evidence for acute intracranial abnormality. 2. Extensive periventricular white matter small vessel ischemic disease. 3. 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   On: 04/07/2016 22:41   Ct Head Wo Contrast  Result Date: 04/01/2016 CLINICAL DATA:  Weakness, less responsive EXAM: CT HEAD WITHOUT CONTRAST TECHNIQUE: Contiguous axial images were obtained from the base of the skull through the vertex without intravenous contrast. COMPARISON:  None. FINDINGS: Brain: No intracranial hemorrhage, mass effect or midline shift. Stable global atrophy. Stable extensive chronic white matter disease. Again noted cavum septum pellucidum. Stable midline tubular lipoma. No definite acute cortical infarction. Ventricular size is stable from prior exam. Vascular: No hyperdense vessel or unexpected calcification. Skull: There are motion artifacts.  No skull fracture is noted. Sinuses/Orbits: Paranasal sinuses and mastoid air cells are unremarkable. Other: None IMPRESSION: No acute intracranial abnormality. Stable atrophy and extensive chronic white matter disease. No definite acute cortical infarction. Stable chronic findings as described above. Electronically Signed   By: Natasha MeadLiviu  Pop M.D.   On: 04/01/2016 20:06   Mr Brain Wo Contrast  Result Date: 04/08/2016 CLINICAL DATA:  Ataxia. EXAM: MRI HEAD WITHOUT CONTRAST TECHNIQUE: Multiplanar, multiecho pulse sequences of  the brain and surrounding structures were obtained without intravenous contrast. COMPARISON:  CT head 04/07/2016  FINDINGS: Limited study. The patient was not able to complete the study. There is considerable motion on the images obtained Brain: Moderate to advanced atrophy. Negative for acute infarct. Moderate chronic microvascular ischemic change. No mass or shift of the midline structures Paranasal sinuses grossly normal. IMPRESSION: Incomplete study degraded by motion Moderate to advanced atrophy. Chronic microvascular ischemic change in the white matter. No acute infarct. Electronically Signed   By: Marlan Palauharles  Clark M.D.   On: 04/08/2016 12:28   Dg Chest Port 1 View  Result Date: 04/13/2016 CLINICAL DATA:  Bradycardia and hypotension.  Lethargy. EXAM: PORTABLE CHEST 1 VIEW COMPARISON:  04/07/2016.  04/01/2016. FINDINGS: Heart size is normal. Mediastinum appears wide, but this is presumed related to the AP portable technique. There is a poor inspiration with low lung volumes. Question early interstitial edema. No effusions. No lobar collapse. IMPRESSION: Portable AP supine film. Mediastinum appears prominent, probably related to the technique. Consider follow-up when able. Poor inspiration. Interstitial prominence that may relate to the poor inspiration or could represent early interstitial edema. Electronically Signed   By: Paulina FusiMark  Shogry M.D.   On: 04/13/2016 11:12   Dg Chest Port 1 View  Result Date: 04/01/2016 CLINICAL DATA:  Confusion and difficulty ambulating.  Former smoker. EXAM: PORTABLE CHEST 1 VIEW COMPARISON:  07/15/2011 FINDINGS: Emphysematous hyperinflation of the lungs, upper lobe predominant with minimal crowding of lower lobe interstitial lung markings. There is atelectasis at the left lung base versus scarring. Eventration of the right hemidiaphragm is stable with colonic interposition over the liver shadow. The heart is normal in size. The aorta is slightly uncoiled without aneurysm. Atheromatous calcifications are seen of the aortic arch and descending aorta. No acute osseous abnormality.  IMPRESSION: COPD without acute pulmonary disease. Electronically Signed   By: Tollie Ethavid  Kwon M.D.   On: 04/01/2016 19:47    Echo: limited study due to inability to cooperate with exam. LV systolic function "probably preserved."   Subjective: Patient disoriented, denies pain or any other complaints. No one at bedside this AM.  Discharge Exam: Vitals:   04/16/16 2008 04/17/16 0634  BP: 122/61 118/67  Pulse: (!) 55   Resp: (!) 21 17  Temp: 98.2 F (36.8 C) 98 F (36.7 C)   Vitals:   04/16/16 1720 04/16/16 2008 04/17/16 0500 04/17/16 0634  BP: 121/64 122/61  118/67  Pulse: (!) 106 (!) 55    Resp:  (!) 21  17  Temp: 98.6 F (37 C) 98.2 F (36.8 C)  98 F (36.7 C)  TempSrc: Oral Oral  Oral  SpO2: 98% 92%  97%  Weight:   83.9 kg (185 lb) 83.6 kg (184 lb 4.8 oz)  Height:       General: Pt is resting quietly in no acute distress Cardiovascular: Regular bradycardia, S1/S2 +, no murmur, rubs, no gallops Respiratory: CTA bilaterally, no wheezing, no rhonchi Abdominal: Soft, NT, ND, bowel sounds + Extremities: no edema, no cyanosis. Cap refill < 2 sec with hands in posey mittens.  The results of significant diagnostics from this hospitalization (including imaging, microbiology, ancillary and laboratory) are listed below for reference.    Microbiology: Recent Results (from the past 240 hour(s))  Culture, Urine     Status: None   Collection Time: 04/13/16 11:02 AM  Result Value Ref Range Status   Specimen Description URINE, CATHETERIZED  Final   Special Requests NONE  Final  Culture NO GROWTH  Final   Report Status 04/14/2016 FINAL  Final  Culture, blood (routine x 2)     Status: None (Preliminary result)   Collection Time: 04/13/16 11:10 AM  Result Value Ref Range Status   Specimen Description BLOOD RIGHT HAND  Final   Special Requests BOTTLES DRAWN AEROBIC ONLY 5CC  Final   Culture NO GROWTH 3 DAYS  Final   Report Status PENDING  Incomplete  Culture, blood (routine x 2)      Status: None (Preliminary result)   Collection Time: 04/13/16 11:15 AM  Result Value Ref Range Status   Specimen Description BLOOD RIGHT ANTECUBITAL  Final   Special Requests BOTTLES DRAWN AEROBIC ONLY 5CC  Final   Culture NO GROWTH 3 DAYS  Final   Report Status PENDING  Incomplete  MRSA PCR Screening     Status: None   Collection Time: 04/13/16  3:09 PM  Result Value Ref Range Status   MRSA by PCR NEGATIVE NEGATIVE Final    Comment:        The GeneXpert MRSA Assay (FDA approved for NASAL specimens only), is one component of a comprehensive MRSA colonization surveillance program. It is not intended to diagnose MRSA infection nor to guide or monitor treatment for MRSA infections.      Labs: BNP (last 3 results) No results for input(s): BNP in the last 8760 hours. Basic Metabolic Panel:  Recent Labs Lab 04/13/16 1038 04/13/16 1119 04/14/16 0227 04/15/16 0458 04/17/16 0444  NA 140 142 137 139 140  K 3.3* 3.3* 3.7 3.9 3.4*  CL 115* 111 111 113* 112*  CO2 19*  --  20* 21* 20*  GLUCOSE 129* 124* 85 74 73  BUN 15 17 13 12 10   CREATININE 0.91 0.90 0.88 0.82 0.86  CALCIUM 8.2*  --  8.7* 8.9 9.1  MG  --   --   --  2.3  --    Liver Function Tests:  Recent Labs Lab 04/13/16 1038  AST 24  ALT 18  ALKPHOS 56  BILITOT 1.1  PROT 4.9*  ALBUMIN 3.1*   No results for input(s): LIPASE, AMYLASE in the last 168 hours. No results for input(s): AMMONIA in the last 168 hours. CBC:  Recent Labs Lab 04/13/16 1038 04/13/16 1119 04/14/16 0227 04/15/16 0458  WBC 5.8  --  6.1 4.9  NEUTROABS 4.3  --   --   --   HGB 12.6* 11.6* 12.5* 13.5  HCT 37.1* 34.0* 35.9* 40.0  MCV 98.1  --  95.7 97.1  PLT 151  --  208 197   Cardiac Enzymes:  Recent Labs Lab 04/13/16 1600 04/13/16 2143  TROPONINI 0.06* 0.06*   BNP: Invalid input(s): POCBNP CBG:  Recent Labs Lab 04/15/16 0722 04/16/16 0440  GLUCAP 67 74   D-Dimer No results for input(s): DDIMER in the last 72  hours. Hgb A1c No results for input(s): HGBA1C in the last 72 hours. Lipid Profile No results for input(s): CHOL, HDL, LDLCALC, TRIG, CHOLHDL, LDLDIRECT in the last 72 hours. Thyroid function studies No results for input(s): TSH, T4TOTAL, T3FREE, THYROIDAB in the last 72 hours.  Invalid input(s): FREET3 Anemia work up No results for input(s): VITAMINB12, FOLATE, FERRITIN, TIBC, IRON, RETICCTPCT in the last 72 hours. Urinalysis    Component Value Date/Time   COLORURINE AMBER (A) 04/13/2016 1102   APPEARANCEUR CLOUDY (A) 04/13/2016 1102   LABSPEC 1.025 04/13/2016 1102   PHURINE 5.5 04/13/2016 1102   GLUCOSEU NEGATIVE 04/13/2016 1102  HGBUR NEGATIVE 04/13/2016 1102   BILIRUBINUR SMALL (A) 04/13/2016 1102   BILIRUBINUR small 01/16/2013 1630   KETONESUR NEGATIVE 04/13/2016 1102   PROTEINUR 30 (A) 04/13/2016 1102   UROBILINOGEN negative 01/16/2013 1630   UROBILINOGEN 1.0 07/15/2011 1917   NITRITE NEGATIVE 04/13/2016 1102   LEUKOCYTESUR NEGATIVE 04/13/2016 1102   Sepsis Labs Invalid input(s): PROCALCITONIN,  WBC,  LACTICIDVEN Microbiology Recent Results (from the past 240 hour(s))  Culture, Urine     Status: None   Collection Time: 04/13/16 11:02 AM  Result Value Ref Range Status   Specimen Description URINE, CATHETERIZED  Final   Special Requests NONE  Final   Culture NO GROWTH  Final   Report Status 04/14/2016 FINAL  Final  Culture, blood (routine x 2)     Status: None (Preliminary result)   Collection Time: 04/13/16 11:10 AM  Result Value Ref Range Status   Specimen Description BLOOD RIGHT HAND  Final   Special Requests BOTTLES DRAWN AEROBIC ONLY 5CC  Final   Culture NO GROWTH 3 DAYS  Final   Report Status PENDING  Incomplete  Culture, blood (routine x 2)     Status: None (Preliminary result)   Collection Time: 04/13/16 11:15 AM  Result Value Ref Range Status   Specimen Description BLOOD RIGHT ANTECUBITAL  Final   Special Requests BOTTLES DRAWN AEROBIC ONLY 5CC  Final    Culture NO GROWTH 3 DAYS  Final   Report Status PENDING  Incomplete  MRSA PCR Screening     Status: None   Collection Time: 04/13/16  3:09 PM  Result Value Ref Range Status   MRSA by PCR NEGATIVE NEGATIVE Final    Comment:        The GeneXpert MRSA Assay (FDA approved for NASAL specimens only), is one component of a comprehensive MRSA colonization surveillance program. It is not intended to diagnose MRSA infection nor to guide or monitor treatment for MRSA infections.     Time coordinating discharge: Over 30 minutes  Hazeline Junker, MD  [facility] 04/17/2016, 11:14 AM Pager [phone]  If 7PM-7AM, please contact night-coverage [email or web address] Password [email or web address]

## 2016-04-17 NOTE — Progress Notes (Signed)
PROGRESS NOTE  [name] DandyWesley O Welker  ZOX:[id number] DOB: 11/28/1926 DOA: 04/13/2016 PCP: Tillman Abideichard Letvak, MD   Brief Narrative: [name] [name] is a 82 y.o. male with a history of alzheimer's dementia progressing over 10 years, HTN, GERD, anemia, and falls presenting with syncope. Information from wife due to patient disorientation. She reports that he nearly fell while in the shower being bathed by an aide when he became unresponsive. He did not fall, but was sat upon a toilet. He had been discharged 3 days prior due to acute encephalopathy in the setting of UTI and has had generalized weakness and greater dependence for ADLs since. EMS was called and reported SBP in 80's and HR in 30's. On arrival hypothermia, bradycardia, and hypotension was concerning for sepsis. IV fluids, warming blanket were given and blood and urine cultures collected. CT of the head was negative for acute intracranial cause of worsening encephalopathy. Neurology was consulted and recommended no further work up of encephalopathy. Aricept was started targeting behavioral issues but bradycardia was worsened, so this was stopped. Namenda was also started hoping to improve behavioral disturbances. .   Assessment & Plan: Principal Problem:   Syncope Active Problems:   Alzheimer's dementia without behavioral disturbance   Essential hypertension, benign   GERD   Acute encephalopathy   Bradycardia   Hypokalemia   Anemia   Hypothermia   Decreased oral intake   Dehydration   Palliative care encounter  Acute encephalopathy on alzheimer's dementia, progressive and advances with behavioral disturbance: Patient getting difficult to manage at home. Difficult to find placement as last 2 hospitalization he was classified as observation - Neuro started aricept 11/4, stopped 11/6 due to bradycardia. Namenda 11/5 >> continuing. - Palliative care consult appreciated - Continue inpatient due to dehydration requiring IV fluids and ongoing  poor po intake. Very high fall risk due to cognition impairment and symptomatic bradycardia. - PT evaluation: recommending SNF - CM and CSW consultants appreciated  Syncope: Suspect symptomatic bradycardia. Lactic acid within the limits of normal. Heart rate 56 blood pressure 113/63 no leukocytosis chest  x-ray without infiltrate - Continue IV fluids - Fall precautions.   Bradycardia: Symptomatic. Not on any anti-arrhythmics. - Cycle troponins 0.06 flat trending. ECG without ischemia. - Echocardiogram very limited study due to pt's inability to cooperate with exam. LV systolic function "probably preserved," and no other findings could be gleaned. - Not a PPM candidate.  Hypothermia: Resolved. Rectal temp 95 on admission. No obvious sepsis. No leukocytosis. Chest x-ray without infiltrate no signs of infectious process. TSH and cortisol wnl.  - Continue IVF given poor po intake and dehydration.  Hypokalemia: Resolved s/p IV repletion. Potassium 3.3 - Check in AM  Iron deficiency anemia: Chronic. Hemoglobin 11.6 on admission without signs of active bleeding. - Monitor - Outpatient follow-up  DVT prophylaxis: Lovenox Code Status: DNR Family Communication: None at bedside this AM Disposition Plan: Remain inpatient for IV fluid hydration in setting of dehydration and symptomatic bradycardia. Anticipate D/C to SNF with palliative care services in 24 hrs.  Consultants:   Palliative Care  Neurology  Procedures:   Echo: limited study due to inability to cooperate with exam. LV systolic function "probably preserved."   Antimicrobials:  None   Subjective: Patient disoriented, denies pain or any other complaints. No one at bedside this AM.  Objective: Vitals:   04/16/16 1720 04/16/16 2008 04/17/16 0500 04/17/16 0634  BP: 121/64 122/61  118/67  Pulse: (!) 106 (!) 55    Resp:  Marland Kitchen(!)  21  17  Temp: 98.6 F (37 C) 98.2 F (36.8 C)  98 F (36.7 C)  TempSrc: Oral Oral  Oral  SpO2:  98% 92%  97%  Weight:   83.9 kg (185 lb) 83.6 kg (184 lb 4.8 oz)  Height:        Intake/Output Summary (Last 24 hours) at 04/17/16 1041 Last data filed at 04/17/16 1000  Gross per 24 hour  Intake          2303.33 ml  Output             1051 ml  Net          1252.33 ml   Filed Weights   04/16/16 0439 04/17/16 0500 04/17/16 0634  Weight: 84.2 kg (185 lb 9.6 oz) 83.9 kg (185 lb) 83.6 kg (184 lb 4.8 oz)    Examination: General exam: 82 y.o. male in no distress Respiratory system: Non-labored breathing ambient air. Clear to auscultation bilaterally.  Cardiovascular system: Bradycardic, regular. No murmur, rub, or gallop. No JVD, and no pedal edema. Gastrointestinal system: Abdomen soft, non-tender, non-distended, with normoactive bowel sounds. No organomegaly or masses felt. Central nervous system: Alert but disoriented x4. No focal neurological deficits. Extremities: Warm, no deformities. Posey mittens bilaterally with normal cap refill in digits when removed temporarily. Skin: No rashes, lesions or ulcers Psychiatry: Unable to determine due to confusion.  Data Reviewed: I have personally reviewed following labs and imaging studies  CBC:  Recent Labs Lab 04/13/16 1038 04/13/16 1119 04/14/16 0227 04/15/16 0458  WBC 5.8  --  6.1 4.9  NEUTROABS 4.3  --   --   --   HGB 12.6* 11.6* 12.5* 13.5  HCT 37.1* 34.0* 35.9* 40.0  MCV 98.1  --  95.7 97.1  PLT 151  --  208 197   Basic Metabolic Panel:  Recent Labs Lab 04/13/16 1038 04/13/16 1119 04/14/16 0227 04/15/16 0458 04/17/16 0444  NA 140 142 137 139 140  K 3.3* 3.3* 3.7 3.9 3.4*  CL 115* 111 111 113* 112*  CO2 19*  --  20* 21* 20*  GLUCOSE 129* 124* 85 74 73  BUN 15 17 13 12 10   CREATININE 0.91 0.90 0.88 0.82 0.86  CALCIUM 8.2*  --  8.7* 8.9 9.1  MG  --   --   --  2.3  --    GFR: Estimated Creatinine Clearance: 60.5 mL/min (by C-G formula based on SCr of 0.86 mg/dL). Liver Function Tests:  Recent Labs Lab  04/13/16 1038  AST 24  ALT 18  ALKPHOS 56  BILITOT 1.1  PROT 4.9*  ALBUMIN 3.1*   No results for input(s): LIPASE, AMYLASE in the last 168 hours. No results for input(s): AMMONIA in the last 168 hours. Coagulation Profile: No results for input(s): INR, PROTIME in the last 168 hours. Cardiac Enzymes:  Recent Labs Lab 04/13/16 1600 04/13/16 2143  TROPONINI 0.06* 0.06*   BNP (last 3 results) No results for input(s): PROBNP in the last 8760 hours. HbA1C: No results for input(s): HGBA1C in the last 72 hours. CBG:  Recent Labs Lab 04/15/16 0722 04/16/16 0440  GLUCAP 67 74   Lipid Profile: No results for input(s): CHOL, HDL, LDLCALC, TRIG, CHOLHDL, LDLDIRECT in the last 72 hours. Thyroid Function Tests: No results for input(s): TSH, T4TOTAL, FREET4, T3FREE, THYROIDAB in the last 72 hours. Anemia Panel: No results for input(s): VITAMINB12, FOLATE, FERRITIN, TIBC, IRON, RETICCTPCT in the last 72 hours. Urine analysis:    Component  Value Date/Time   COLORURINE AMBER (A) 04/13/2016 1102   APPEARANCEUR CLOUDY (A) 04/13/2016 1102   LABSPEC 1.025 04/13/2016 1102   PHURINE 5.5 04/13/2016 1102   GLUCOSEU NEGATIVE 04/13/2016 1102   HGBUR NEGATIVE 04/13/2016 1102   BILIRUBINUR SMALL (A) 04/13/2016 1102   BILIRUBINUR small 01/16/2013 1630   KETONESUR NEGATIVE 04/13/2016 1102   PROTEINUR 30 (A) 04/13/2016 1102   UROBILINOGEN negative 01/16/2013 1630   UROBILINOGEN 1.0 07/15/2011 1917   NITRITE NEGATIVE 04/13/2016 1102   LEUKOCYTESUR NEGATIVE 04/13/2016 1102   Sepsis Labs: @LABRCNTIP (procalcitonin:4,lacticidven:4)  ) Recent Results (from the past 240 hour(s))  Culture, Urine     Status: None   Collection Time: 04/13/16 11:02 AM  Result Value Ref Range Status   Specimen Description URINE, CATHETERIZED  Final   Special Requests NONE  Final   Culture NO GROWTH  Final   Report Status 04/14/2016 FINAL  Final  Culture, blood (routine x 2)     Status: None (Preliminary  result)   Collection Time: 04/13/16 11:10 AM  Result Value Ref Range Status   Specimen Description BLOOD RIGHT HAND  Final   Special Requests BOTTLES DRAWN AEROBIC ONLY 5CC  Final   Culture NO GROWTH 3 DAYS  Final   Report Status PENDING  Incomplete  Culture, blood (routine x 2)     Status: None (Preliminary result)   Collection Time: 04/13/16 11:15 AM  Result Value Ref Range Status   Specimen Description BLOOD RIGHT ANTECUBITAL  Final   Special Requests BOTTLES DRAWN AEROBIC ONLY 5CC  Final   Culture NO GROWTH 3 DAYS  Final   Report Status PENDING  Incomplete  MRSA PCR Screening     Status: None   Collection Time: 04/13/16  3:09 PM  Result Value Ref Range Status   MRSA by PCR NEGATIVE NEGATIVE Final    Comment:        The GeneXpert MRSA Assay (FDA approved for NASAL specimens only), is one component of a comprehensive MRSA colonization surveillance program. It is not intended to diagnose MRSA infection nor to guide or monitor treatment for MRSA infections.      Radiology Studies: No results found.  Scheduled Meds: . acetaminophen  650 mg Oral BID  . citalopram  10 mg Oral Daily  . enoxaparin (LOVENOX) injection  40 mg Subcutaneous Q24H  . memantine  10 mg Oral BID  . senna-docusate  1 tablet Oral BID  . sodium chloride flush  3 mL Intravenous Q12H  . vitamin B-12  1,000 mcg Oral Daily   Continuous Infusions: . sodium chloride 100 mL/hr at 04/17/16 0315     LOS: 3 days   Time spent: 15 minutes.  Hazeline Junkeryan Aniket Paye, MD [facility] Pager [phone]  If 7PM-7AM, please contact night-coverage [email or web address] Password [email or web address] 04/17/2016, 10:41 AM

## 2016-04-17 NOTE — Progress Notes (Signed)
Palliative Medicine RN Note: Discussed pt in rounds. Goals and d/c plan set. Please re-consult if PMT is needed. Will sign off.  Margret ChanceMelanie G. Delron Comer, RN, BSN, [facility] 04/17/2016 8:48 AM Cell [phone] 8:00-4:00 Monday-Friday Office [phone]

## 2016-04-17 NOTE — Progress Notes (Signed)
Subjective: Continues to be confused.   Exam: Vitals:   04/16/16 2008 04/17/16 0634  BP: 122/61 118/67  Pulse: (!) 55   Resp: (!) 21 17  Temp: 98.2 F (36.8 C) 98 F (36.7 C)   Gen: In bed, NAD Resp: non-labored breathing, no acute distress Abd: soft, nt  Neuro: MS: awake, able to tell me his name, and follow simple commands. Disorganized thoguht process and commands have to be given repeatedly.  CN:L pupil slightly larger than right.  Motor: MAEW, mild parkinsonian tremor of the right hand.  Sensory:responds to stim x 4.   Pertinent Labs: Bmp - unremarkable.   Impression: 82 yo M admitted with symptomatic brady cardia with end stage alzheimers. I suspect he may have some underlying parkinsonism as well which could raise lewy body dementia as a possibility, but I would not start any dopaminergic medications at this time given that they could worsen his delirium and his motor symptoms are mild.   With his advanced stage of dementia and bradycardia, I am not certain if I would continue aricept, though namenda could still offer some benefit.   Recommendations: 1) discontinue aricept.  2) continue namenda 10mg  daily.  3) lights off at night, on duering day  Ritta SlotMcNeill Kirkpatrick, MD [facility] [phone]  If 7pm- 7am, please page neurology on call as listed in AMION.

## 2016-04-17 NOTE — Clinical Social Work Placement (Signed)
   CLINICAL SOCIAL WORK PLACEMENT  NOTE  Date:  04/17/2016  Patient Details  Name: [name] [name] MRN: [id number] Date of Birth: 07/21/1926  Clinical Social Work is seeking post-discharge placement for this patient at the Skilled  Nursing Facility level of care (*CSW will initial, date and re-position this form in  chart as items are completed):  Yes   Patient/family provided with [facility] Clinical Social Work Department's list of facilities offering this level of care within the geographic area requested by the patient (or if unable, by the patient's family).  Yes   Patient/family informed of their freedom to choose among providers that offer the needed level of care, that participate in Medicare, Medicaid or managed care program needed by the patient, have an available bed and are willing to accept the patient.  Yes   Patient/family informed of [facility] interest [facility] and [facility], as well as of the fact that they are under no obligation to receive care at these facilities.  PASRR submitted to EDS on       PASRR number received on       Existing PASRR number confirmed on 04/17/16     FL2 transmitted to all facilities in geographic area requested by pt/family on 04/17/16     FL2 transmitted to all facilities within larger geographic area on       Patient informed that his/her managed care company has contracts with or will negotiate with certain facilities, including the following:        Yes   Patient/family informed of bed offers received.  [facility]     Physician recommends and patient chooses bed at      Patient to be transferred to [facility] on 04/17/16.  Patient to be transferred to facility by Ambulance     Patient family notified on 04/17/16 of transfer.  Name of family member notified:  Peggy     PHYSICIAN Please prepare priority discharge summary, including medications,  Please prepare prescriptions, Please sign FL2, Please sign DNR     Additional Comment:  Per MD patient ready for DC to [facility]. RN, patient, patient's family, and facility notified of DC. RN given number for report. DC packet on chart. Ambulance transport requested for patient. CSW signing off.   _______________________________________________ Venita Lickampbell, Silvester Reierson B, LCSW 04/17/2016, 1:12 PM

## 2016-04-18 LAB — CULTURE, BLOOD (ROUTINE X 2)
CULTURE: NO GROWTH
Culture: NO GROWTH

## 2016-04-19 ENCOUNTER — Encounter: Payer: Self-pay | Admitting: *Deleted

## 2016-04-19 NOTE — Patient Outreach (Signed)
Met with social worker at Skilled facility regarding patient discharge planning needs. SW confirms patient will be remaining long term care at facility. No anticipated discharge needs. RNCM will sign off, but will be available if discharge needs change. Royetta Crochet. Laymond Purser, RN, BSN, [place] Coordinator [phone]

## 2016-04-22 DIAGNOSIS — K5900 Constipation, unspecified: Secondary | ICD-10-CM | POA: Diagnosis not present

## 2016-04-22 DIAGNOSIS — F419 Anxiety disorder, unspecified: Secondary | ICD-10-CM | POA: Diagnosis not present

## 2016-04-22 DIAGNOSIS — M1990 Unspecified osteoarthritis, unspecified site: Secondary | ICD-10-CM | POA: Diagnosis not present

## 2016-04-22 DIAGNOSIS — F329 Major depressive disorder, single episode, unspecified: Secondary | ICD-10-CM | POA: Diagnosis not present

## 2016-04-22 DIAGNOSIS — F0390 Unspecified dementia without behavioral disturbance: Secondary | ICD-10-CM | POA: Diagnosis not present

## 2016-04-29 DIAGNOSIS — N390 Urinary tract infection, site not specified: Secondary | ICD-10-CM | POA: Diagnosis not present

## 2016-04-29 DIAGNOSIS — K5900 Constipation, unspecified: Secondary | ICD-10-CM | POA: Diagnosis not present

## 2016-04-29 DIAGNOSIS — G933 Postviral fatigue syndrome: Secondary | ICD-10-CM | POA: Diagnosis not present

## 2016-04-29 DIAGNOSIS — F0390 Unspecified dementia without behavioral disturbance: Secondary | ICD-10-CM | POA: Diagnosis not present

## 2016-04-29 DIAGNOSIS — F419 Anxiety disorder, unspecified: Secondary | ICD-10-CM | POA: Diagnosis not present

## 2016-04-29 DIAGNOSIS — M159 Polyosteoarthritis, unspecified: Secondary | ICD-10-CM | POA: Diagnosis not present

## 2016-05-01 ENCOUNTER — Ambulatory Visit: Payer: Medicare Other | Admitting: Internal Medicine

## 2016-05-03 ENCOUNTER — Telehealth: Payer: Self-pay | Admitting: Internal Medicine

## 2016-05-03 NOTE — Telephone Encounter (Signed)
Okay 

## 2016-05-03 NOTE — Telephone Encounter (Signed)
I spoke to patient's wife.  Patient isn't home.  He's being treated for a UTI.  She said she went to visit patient yesterday and he was sound asleep.  She said she'll call when patient comes home and schedule a follow up appointment with Dr.Letvak.

## 2016-05-17 DIAGNOSIS — F0390 Unspecified dementia without behavioral disturbance: Secondary | ICD-10-CM | POA: Diagnosis not present

## 2016-05-17 DIAGNOSIS — K5900 Constipation, unspecified: Secondary | ICD-10-CM | POA: Diagnosis not present

## 2016-05-17 DIAGNOSIS — I10 Essential (primary) hypertension: Secondary | ICD-10-CM | POA: Diagnosis not present

## 2016-05-17 DIAGNOSIS — F329 Major depressive disorder, single episode, unspecified: Secondary | ICD-10-CM | POA: Diagnosis not present

## 2016-05-17 DIAGNOSIS — N390 Urinary tract infection, site not specified: Secondary | ICD-10-CM | POA: Diagnosis not present

## 2016-05-17 DIAGNOSIS — F419 Anxiety disorder, unspecified: Secondary | ICD-10-CM | POA: Diagnosis not present

## 2016-05-18 DIAGNOSIS — N390 Urinary tract infection, site not specified: Secondary | ICD-10-CM | POA: Diagnosis not present

## 2016-05-30 DIAGNOSIS — K5900 Constipation, unspecified: Secondary | ICD-10-CM | POA: Diagnosis not present

## 2016-05-30 DIAGNOSIS — F0391 Unspecified dementia with behavioral disturbance: Secondary | ICD-10-CM | POA: Diagnosis not present

## 2016-05-30 DIAGNOSIS — R1312 Dysphagia, oropharyngeal phase: Secondary | ICD-10-CM | POA: Diagnosis not present

## 2016-05-30 DIAGNOSIS — D518 Other vitamin B12 deficiency anemias: Secondary | ICD-10-CM | POA: Diagnosis not present

## 2016-05-30 DIAGNOSIS — F3289 Other specified depressive episodes: Secondary | ICD-10-CM | POA: Diagnosis not present

## 2016-05-31 DIAGNOSIS — K5900 Constipation, unspecified: Secondary | ICD-10-CM | POA: Diagnosis not present

## 2016-05-31 DIAGNOSIS — D518 Other vitamin B12 deficiency anemias: Secondary | ICD-10-CM | POA: Diagnosis not present

## 2016-05-31 DIAGNOSIS — F0391 Unspecified dementia with behavioral disturbance: Secondary | ICD-10-CM | POA: Diagnosis not present

## 2016-05-31 DIAGNOSIS — F3289 Other specified depressive episodes: Secondary | ICD-10-CM | POA: Diagnosis not present

## 2016-05-31 DIAGNOSIS — R1312 Dysphagia, oropharyngeal phase: Secondary | ICD-10-CM | POA: Diagnosis not present

## 2016-06-01 DIAGNOSIS — F3289 Other specified depressive episodes: Secondary | ICD-10-CM | POA: Diagnosis not present

## 2016-06-01 DIAGNOSIS — F0391 Unspecified dementia with behavioral disturbance: Secondary | ICD-10-CM | POA: Diagnosis not present

## 2016-06-01 DIAGNOSIS — R1312 Dysphagia, oropharyngeal phase: Secondary | ICD-10-CM | POA: Diagnosis not present

## 2016-06-01 DIAGNOSIS — D518 Other vitamin B12 deficiency anemias: Secondary | ICD-10-CM | POA: Diagnosis not present

## 2016-06-01 DIAGNOSIS — K5900 Constipation, unspecified: Secondary | ICD-10-CM | POA: Diagnosis not present

## 2016-06-02 DIAGNOSIS — K5900 Constipation, unspecified: Secondary | ICD-10-CM | POA: Diagnosis not present

## 2016-06-02 DIAGNOSIS — R1312 Dysphagia, oropharyngeal phase: Secondary | ICD-10-CM | POA: Diagnosis not present

## 2016-06-02 DIAGNOSIS — D518 Other vitamin B12 deficiency anemias: Secondary | ICD-10-CM | POA: Diagnosis not present

## 2016-06-02 DIAGNOSIS — F0391 Unspecified dementia with behavioral disturbance: Secondary | ICD-10-CM | POA: Diagnosis not present

## 2016-06-02 DIAGNOSIS — F3289 Other specified depressive episodes: Secondary | ICD-10-CM | POA: Diagnosis not present

## 2016-06-04 DIAGNOSIS — D518 Other vitamin B12 deficiency anemias: Secondary | ICD-10-CM | POA: Diagnosis not present

## 2016-06-04 DIAGNOSIS — F3289 Other specified depressive episodes: Secondary | ICD-10-CM | POA: Diagnosis not present

## 2016-06-04 DIAGNOSIS — R1312 Dysphagia, oropharyngeal phase: Secondary | ICD-10-CM | POA: Diagnosis not present

## 2016-06-04 DIAGNOSIS — F0391 Unspecified dementia with behavioral disturbance: Secondary | ICD-10-CM | POA: Diagnosis not present

## 2016-06-04 DIAGNOSIS — K5900 Constipation, unspecified: Secondary | ICD-10-CM | POA: Diagnosis not present

## 2016-06-06 DIAGNOSIS — D518 Other vitamin B12 deficiency anemias: Secondary | ICD-10-CM | POA: Diagnosis not present

## 2016-06-06 DIAGNOSIS — R1312 Dysphagia, oropharyngeal phase: Secondary | ICD-10-CM | POA: Diagnosis not present

## 2016-06-06 DIAGNOSIS — K5900 Constipation, unspecified: Secondary | ICD-10-CM | POA: Diagnosis not present

## 2016-06-06 DIAGNOSIS — F0391 Unspecified dementia with behavioral disturbance: Secondary | ICD-10-CM | POA: Diagnosis not present

## 2016-06-06 DIAGNOSIS — F3289 Other specified depressive episodes: Secondary | ICD-10-CM | POA: Diagnosis not present

## 2016-06-07 DIAGNOSIS — F0391 Unspecified dementia with behavioral disturbance: Secondary | ICD-10-CM | POA: Diagnosis not present

## 2016-06-07 DIAGNOSIS — F3289 Other specified depressive episodes: Secondary | ICD-10-CM | POA: Diagnosis not present

## 2016-06-07 DIAGNOSIS — D518 Other vitamin B12 deficiency anemias: Secondary | ICD-10-CM | POA: Diagnosis not present

## 2016-06-07 DIAGNOSIS — R1312 Dysphagia, oropharyngeal phase: Secondary | ICD-10-CM | POA: Diagnosis not present

## 2016-06-07 DIAGNOSIS — K5900 Constipation, unspecified: Secondary | ICD-10-CM | POA: Diagnosis not present

## 2016-06-08 DIAGNOSIS — F3289 Other specified depressive episodes: Secondary | ICD-10-CM | POA: Diagnosis not present

## 2016-06-08 DIAGNOSIS — K5900 Constipation, unspecified: Secondary | ICD-10-CM | POA: Diagnosis not present

## 2016-06-08 DIAGNOSIS — F0391 Unspecified dementia with behavioral disturbance: Secondary | ICD-10-CM | POA: Diagnosis not present

## 2016-06-08 DIAGNOSIS — D518 Other vitamin B12 deficiency anemias: Secondary | ICD-10-CM | POA: Diagnosis not present

## 2016-06-08 DIAGNOSIS — R1312 Dysphagia, oropharyngeal phase: Secondary | ICD-10-CM | POA: Diagnosis not present

## 2016-06-09 DIAGNOSIS — R1312 Dysphagia, oropharyngeal phase: Secondary | ICD-10-CM | POA: Diagnosis not present

## 2016-06-09 DIAGNOSIS — D518 Other vitamin B12 deficiency anemias: Secondary | ICD-10-CM | POA: Diagnosis not present

## 2016-06-09 DIAGNOSIS — F0391 Unspecified dementia with behavioral disturbance: Secondary | ICD-10-CM | POA: Diagnosis not present

## 2016-06-09 DIAGNOSIS — K5900 Constipation, unspecified: Secondary | ICD-10-CM | POA: Diagnosis not present

## 2016-06-09 DIAGNOSIS — F3289 Other specified depressive episodes: Secondary | ICD-10-CM | POA: Diagnosis not present

## 2016-06-12 DIAGNOSIS — R296 Repeated falls: Secondary | ICD-10-CM | POA: Diagnosis not present

## 2016-06-12 DIAGNOSIS — M6281 Muscle weakness (generalized): Secondary | ICD-10-CM | POA: Diagnosis not present

## 2016-06-12 DIAGNOSIS — F0391 Unspecified dementia with behavioral disturbance: Secondary | ICD-10-CM | POA: Diagnosis not present

## 2016-06-12 DIAGNOSIS — F3289 Other specified depressive episodes: Secondary | ICD-10-CM | POA: Diagnosis not present

## 2016-06-12 DIAGNOSIS — D518 Other vitamin B12 deficiency anemias: Secondary | ICD-10-CM | POA: Diagnosis not present

## 2016-06-12 DIAGNOSIS — K5900 Constipation, unspecified: Secondary | ICD-10-CM | POA: Diagnosis not present

## 2016-06-12 DIAGNOSIS — R1312 Dysphagia, oropharyngeal phase: Secondary | ICD-10-CM | POA: Diagnosis not present

## 2016-06-13 DIAGNOSIS — F3289 Other specified depressive episodes: Secondary | ICD-10-CM | POA: Diagnosis not present

## 2016-06-13 DIAGNOSIS — K5900 Constipation, unspecified: Secondary | ICD-10-CM | POA: Diagnosis not present

## 2016-06-13 DIAGNOSIS — M6281 Muscle weakness (generalized): Secondary | ICD-10-CM | POA: Diagnosis not present

## 2016-06-13 DIAGNOSIS — R296 Repeated falls: Secondary | ICD-10-CM | POA: Diagnosis not present

## 2016-06-13 DIAGNOSIS — F0391 Unspecified dementia with behavioral disturbance: Secondary | ICD-10-CM | POA: Diagnosis not present

## 2016-06-13 DIAGNOSIS — R1312 Dysphagia, oropharyngeal phase: Secondary | ICD-10-CM | POA: Diagnosis not present

## 2016-06-14 DIAGNOSIS — K5900 Constipation, unspecified: Secondary | ICD-10-CM | POA: Diagnosis not present

## 2016-06-14 DIAGNOSIS — R296 Repeated falls: Secondary | ICD-10-CM | POA: Diagnosis not present

## 2016-06-14 DIAGNOSIS — R1312 Dysphagia, oropharyngeal phase: Secondary | ICD-10-CM | POA: Diagnosis not present

## 2016-06-14 DIAGNOSIS — M6281 Muscle weakness (generalized): Secondary | ICD-10-CM | POA: Diagnosis not present

## 2016-06-14 DIAGNOSIS — F3289 Other specified depressive episodes: Secondary | ICD-10-CM | POA: Diagnosis not present

## 2016-06-14 DIAGNOSIS — F0391 Unspecified dementia with behavioral disturbance: Secondary | ICD-10-CM | POA: Diagnosis not present

## 2016-06-15 DIAGNOSIS — K5900 Constipation, unspecified: Secondary | ICD-10-CM | POA: Diagnosis not present

## 2016-06-15 DIAGNOSIS — R1312 Dysphagia, oropharyngeal phase: Secondary | ICD-10-CM | POA: Diagnosis not present

## 2016-06-15 DIAGNOSIS — F0391 Unspecified dementia with behavioral disturbance: Secondary | ICD-10-CM | POA: Diagnosis not present

## 2016-06-15 DIAGNOSIS — F3289 Other specified depressive episodes: Secondary | ICD-10-CM | POA: Diagnosis not present

## 2016-06-15 DIAGNOSIS — M6281 Muscle weakness (generalized): Secondary | ICD-10-CM | POA: Diagnosis not present

## 2016-06-15 DIAGNOSIS — R296 Repeated falls: Secondary | ICD-10-CM | POA: Diagnosis not present

## 2016-06-16 DIAGNOSIS — R1312 Dysphagia, oropharyngeal phase: Secondary | ICD-10-CM | POA: Diagnosis not present

## 2016-06-16 DIAGNOSIS — D519 Vitamin B12 deficiency anemia, unspecified: Secondary | ICD-10-CM | POA: Diagnosis not present

## 2016-06-16 DIAGNOSIS — F0391 Unspecified dementia with behavioral disturbance: Secondary | ICD-10-CM | POA: Diagnosis not present

## 2016-06-16 DIAGNOSIS — F419 Anxiety disorder, unspecified: Secondary | ICD-10-CM | POA: Diagnosis not present

## 2016-06-16 DIAGNOSIS — F329 Major depressive disorder, single episode, unspecified: Secondary | ICD-10-CM | POA: Diagnosis not present

## 2016-06-16 DIAGNOSIS — M150 Primary generalized (osteo)arthritis: Secondary | ICD-10-CM | POA: Diagnosis not present

## 2016-06-16 DIAGNOSIS — M6281 Muscle weakness (generalized): Secondary | ICD-10-CM | POA: Diagnosis not present

## 2016-06-16 DIAGNOSIS — K5900 Constipation, unspecified: Secondary | ICD-10-CM | POA: Diagnosis not present

## 2016-06-16 DIAGNOSIS — R296 Repeated falls: Secondary | ICD-10-CM | POA: Diagnosis not present

## 2016-06-16 DIAGNOSIS — F3289 Other specified depressive episodes: Secondary | ICD-10-CM | POA: Diagnosis not present

## 2016-06-19 DIAGNOSIS — F3289 Other specified depressive episodes: Secondary | ICD-10-CM | POA: Diagnosis not present

## 2016-06-19 DIAGNOSIS — R296 Repeated falls: Secondary | ICD-10-CM | POA: Diagnosis not present

## 2016-06-19 DIAGNOSIS — M6281 Muscle weakness (generalized): Secondary | ICD-10-CM | POA: Diagnosis not present

## 2016-06-19 DIAGNOSIS — K5900 Constipation, unspecified: Secondary | ICD-10-CM | POA: Diagnosis not present

## 2016-06-19 DIAGNOSIS — R1312 Dysphagia, oropharyngeal phase: Secondary | ICD-10-CM | POA: Diagnosis not present

## 2016-06-19 DIAGNOSIS — F0391 Unspecified dementia with behavioral disturbance: Secondary | ICD-10-CM | POA: Diagnosis not present

## 2016-06-20 DIAGNOSIS — K5900 Constipation, unspecified: Secondary | ICD-10-CM | POA: Diagnosis not present

## 2016-06-20 DIAGNOSIS — R1312 Dysphagia, oropharyngeal phase: Secondary | ICD-10-CM | POA: Diagnosis not present

## 2016-06-20 DIAGNOSIS — M6281 Muscle weakness (generalized): Secondary | ICD-10-CM | POA: Diagnosis not present

## 2016-06-20 DIAGNOSIS — F3289 Other specified depressive episodes: Secondary | ICD-10-CM | POA: Diagnosis not present

## 2016-06-20 DIAGNOSIS — F0391 Unspecified dementia with behavioral disturbance: Secondary | ICD-10-CM | POA: Diagnosis not present

## 2016-06-20 DIAGNOSIS — R296 Repeated falls: Secondary | ICD-10-CM | POA: Diagnosis not present

## 2016-06-21 DIAGNOSIS — R1312 Dysphagia, oropharyngeal phase: Secondary | ICD-10-CM | POA: Diagnosis not present

## 2016-06-21 DIAGNOSIS — F3289 Other specified depressive episodes: Secondary | ICD-10-CM | POA: Diagnosis not present

## 2016-06-21 DIAGNOSIS — K5900 Constipation, unspecified: Secondary | ICD-10-CM | POA: Diagnosis not present

## 2016-06-21 DIAGNOSIS — R296 Repeated falls: Secondary | ICD-10-CM | POA: Diagnosis not present

## 2016-06-21 DIAGNOSIS — F0391 Unspecified dementia with behavioral disturbance: Secondary | ICD-10-CM | POA: Diagnosis not present

## 2016-06-21 DIAGNOSIS — M6281 Muscle weakness (generalized): Secondary | ICD-10-CM | POA: Diagnosis not present

## 2016-06-22 DIAGNOSIS — K5900 Constipation, unspecified: Secondary | ICD-10-CM | POA: Diagnosis not present

## 2016-06-22 DIAGNOSIS — F0391 Unspecified dementia with behavioral disturbance: Secondary | ICD-10-CM | POA: Diagnosis not present

## 2016-06-22 DIAGNOSIS — R296 Repeated falls: Secondary | ICD-10-CM | POA: Diagnosis not present

## 2016-06-22 DIAGNOSIS — M6281 Muscle weakness (generalized): Secondary | ICD-10-CM | POA: Diagnosis not present

## 2016-06-22 DIAGNOSIS — R1312 Dysphagia, oropharyngeal phase: Secondary | ICD-10-CM | POA: Diagnosis not present

## 2016-06-22 DIAGNOSIS — F3289 Other specified depressive episodes: Secondary | ICD-10-CM | POA: Diagnosis not present

## 2016-06-23 DIAGNOSIS — F0391 Unspecified dementia with behavioral disturbance: Secondary | ICD-10-CM | POA: Diagnosis not present

## 2016-06-23 DIAGNOSIS — R1312 Dysphagia, oropharyngeal phase: Secondary | ICD-10-CM | POA: Diagnosis not present

## 2016-06-23 DIAGNOSIS — F3289 Other specified depressive episodes: Secondary | ICD-10-CM | POA: Diagnosis not present

## 2016-06-23 DIAGNOSIS — K5900 Constipation, unspecified: Secondary | ICD-10-CM | POA: Diagnosis not present

## 2016-06-23 DIAGNOSIS — R296 Repeated falls: Secondary | ICD-10-CM | POA: Diagnosis not present

## 2016-06-23 DIAGNOSIS — M6281 Muscle weakness (generalized): Secondary | ICD-10-CM | POA: Diagnosis not present

## 2016-06-24 DIAGNOSIS — R0989 Other specified symptoms and signs involving the circulatory and respiratory systems: Secondary | ICD-10-CM | POA: Diagnosis not present

## 2016-07-13 DEATH — deceased
